# Patient Record
Sex: Male | Born: 1935 | Race: White | Hispanic: No | Smoking: Former smoker
Health system: Southern US, Community
[De-identification: ages and names within clinical notes are randomized; demographics above are authoritative.]

## PROBLEM LIST (undated history)

## (undated) DIAGNOSIS — R9431 Abnormal electrocardiogram [ECG] [EKG]: Secondary | ICD-10-CM

## (undated) DIAGNOSIS — I442 Atrioventricular block, complete: Secondary | ICD-10-CM

## (undated) DIAGNOSIS — G243 Spasmodic torticollis: Secondary | ICD-10-CM

## (undated) DIAGNOSIS — I714 Abdominal aortic aneurysm, without rupture, unspecified: Secondary | ICD-10-CM

## (undated) DIAGNOSIS — I2510 Atherosclerotic heart disease of native coronary artery without angina pectoris: Secondary | ICD-10-CM

## (undated) DIAGNOSIS — E7800 Pure hypercholesterolemia, unspecified: Secondary | ICD-10-CM

## (undated) DIAGNOSIS — I452 Bifascicular block: Secondary | ICD-10-CM

## (undated) DIAGNOSIS — I480 Paroxysmal atrial fibrillation: Secondary | ICD-10-CM

## (undated) DIAGNOSIS — R001 Bradycardia, unspecified: Secondary | ICD-10-CM

## (undated) DIAGNOSIS — I350 Nonrheumatic aortic (valve) stenosis: Secondary | ICD-10-CM

## (undated) DIAGNOSIS — M1990 Unspecified osteoarthritis, unspecified site: Secondary | ICD-10-CM

## (undated) DIAGNOSIS — I4510 Unspecified right bundle-branch block: Secondary | ICD-10-CM

## (undated) HISTORY — DX: Spasmodic torticollis: G24.3

## (undated) HISTORY — DX: Pure hypercholesterolemia, unspecified: E78.00

## (undated) HISTORY — DX: Bradycardia, unspecified: R00.1

## (undated) HISTORY — DX: Abdominal aortic aneurysm, without rupture, unspecified: I71.40

## (undated) HISTORY — PX: OTHER SURGICAL HISTORY: SHX169

## (undated) HISTORY — DX: Nonrheumatic aortic (valve) stenosis: I35.0

## (undated) HISTORY — DX: Unspecified right bundle-branch block: I45.10

## (undated) HISTORY — DX: Atherosclerotic heart disease of native coronary artery without angina pectoris: I25.10

## (undated) HISTORY — DX: Abnormal electrocardiogram (ECG) (EKG): R94.31

## (undated) HISTORY — DX: Bifascicular block: I45.2

## (undated) HISTORY — DX: Abdominal aortic aneurysm, without rupture: I71.4

---

## 1998-10-25 ENCOUNTER — Ambulatory Visit (HOSPITAL_BASED_OUTPATIENT_CLINIC_OR_DEPARTMENT_OTHER): Admission: RE | Admit: 1998-10-25 | Discharge: 1998-10-25 | Payer: Self-pay | Admitting: Otolaryngology

## 2002-03-30 ENCOUNTER — Inpatient Hospital Stay (HOSPITAL_COMMUNITY): Admission: EM | Admit: 2002-03-30 | Discharge: 2002-04-01 | Payer: Self-pay | Admitting: Emergency Medicine

## 2002-10-13 ENCOUNTER — Ambulatory Visit (HOSPITAL_COMMUNITY): Admission: RE | Admit: 2002-10-13 | Discharge: 2002-10-14 | Payer: Self-pay | Admitting: Cardiology

## 2002-10-26 ENCOUNTER — Encounter: Payer: Self-pay | Admitting: Surgery

## 2002-10-30 ENCOUNTER — Inpatient Hospital Stay (HOSPITAL_COMMUNITY): Admission: RE | Admit: 2002-10-30 | Discharge: 2002-11-06 | Payer: Self-pay | Admitting: Surgery

## 2002-10-30 ENCOUNTER — Encounter: Payer: Self-pay | Admitting: Surgery

## 2002-10-30 ENCOUNTER — Encounter (INDEPENDENT_AMBULATORY_CARE_PROVIDER_SITE_OTHER): Payer: Self-pay | Admitting: Specialist

## 2002-10-30 HISTORY — PX: CORONARY ARTERY BYPASS GRAFT: SHX141

## 2002-10-30 HISTORY — PX: AORTIC VALVE REPLACEMENT: SHX41

## 2002-10-31 ENCOUNTER — Encounter: Payer: Self-pay | Admitting: Surgery

## 2002-11-01 ENCOUNTER — Encounter: Payer: Self-pay | Admitting: Surgery

## 2002-11-02 ENCOUNTER — Encounter: Payer: Self-pay | Admitting: Surgery

## 2003-01-01 ENCOUNTER — Encounter (HOSPITAL_COMMUNITY): Admission: RE | Admit: 2003-01-01 | Discharge: 2003-03-25 | Payer: Self-pay | Admitting: Cardiology

## 2003-02-15 ENCOUNTER — Encounter: Admission: RE | Admit: 2003-02-15 | Discharge: 2003-02-15 | Payer: Self-pay | Admitting: Emergency Medicine

## 2003-02-15 ENCOUNTER — Encounter: Payer: Self-pay | Admitting: Emergency Medicine

## 2003-03-27 ENCOUNTER — Encounter (HOSPITAL_COMMUNITY): Admission: RE | Admit: 2003-03-27 | Discharge: 2003-06-25 | Payer: Self-pay | Admitting: Cardiology

## 2003-07-09 ENCOUNTER — Encounter (HOSPITAL_COMMUNITY): Admission: RE | Admit: 2003-07-09 | Discharge: 2003-08-08 | Payer: Self-pay | Admitting: Cardiology

## 2004-03-29 ENCOUNTER — Emergency Department (HOSPITAL_COMMUNITY): Admission: EM | Admit: 2004-03-29 | Discharge: 2004-03-29 | Payer: Self-pay | Admitting: Internal Medicine

## 2005-05-19 ENCOUNTER — Ambulatory Visit: Payer: Self-pay | Admitting: Internal Medicine

## 2008-07-02 ENCOUNTER — Encounter: Admission: RE | Admit: 2008-07-02 | Discharge: 2008-07-02 | Payer: Self-pay | Admitting: Internal Medicine

## 2008-08-24 ENCOUNTER — Ambulatory Visit (HOSPITAL_COMMUNITY): Admission: RE | Admit: 2008-08-24 | Discharge: 2008-08-24 | Payer: Self-pay | Admitting: *Deleted

## 2010-04-11 ENCOUNTER — Ambulatory Visit: Payer: Self-pay | Admitting: Cardiology

## 2010-04-11 ENCOUNTER — Ambulatory Visit (HOSPITAL_COMMUNITY): Admission: RE | Admit: 2010-04-11 | Discharge: 2010-04-11 | Payer: Self-pay | Admitting: Cardiology

## 2010-07-14 ENCOUNTER — Ambulatory Visit: Payer: Self-pay | Admitting: Cardiology

## 2010-09-29 ENCOUNTER — Encounter: Payer: Self-pay | Admitting: Internal Medicine

## 2010-11-13 ENCOUNTER — Ambulatory Visit (INDEPENDENT_AMBULATORY_CARE_PROVIDER_SITE_OTHER): Payer: Medicare Other | Admitting: Cardiology

## 2010-11-13 DIAGNOSIS — I359 Nonrheumatic aortic valve disorder, unspecified: Secondary | ICD-10-CM

## 2010-11-13 DIAGNOSIS — R51 Headache: Secondary | ICD-10-CM

## 2010-11-13 DIAGNOSIS — Z951 Presence of aortocoronary bypass graft: Secondary | ICD-10-CM

## 2010-12-15 ENCOUNTER — Other Ambulatory Visit: Payer: Self-pay | Admitting: *Deleted

## 2010-12-15 DIAGNOSIS — I2510 Atherosclerotic heart disease of native coronary artery without angina pectoris: Secondary | ICD-10-CM

## 2010-12-15 MED ORDER — FUROSEMIDE 80 MG PO TABS: 80.0000 mg | ORAL_TABLET | Freq: Every day | ORAL | Status: DC

## 2011-01-20 NOTE — Op Note (Signed)
[name], KNEBEL NO.:  [id number]   MEDICAL RECORD NO.:  [id number]          PATIENT TYPE:  AMB   LOCATION:  ENDO                         FACILITY:  [facility]   PHYSICIAN:  Georgiana Spinner, M.D.    DATE OF BIRTH:  07-26-1930   DATE OF PROCEDURE:  08/24/2008  DATE OF DISCHARGE:                               OPERATIVE REPORT   PROCEDURE:  Upper endoscopy.   INDICATIONS:  Hemoccult positivity, iron-deficiency anemia.   ANESTHESIA:  Fentanyl 50 mcg, Versed 4 mg.   PROCEDURE:  With the patient mildly sedated in the left lateral  decubitus position, the Pentax videoscopic endoscope was inserted in the  mouth, passed under direct vision through the esophagus which appeared  normal and in the stomach.  Fundus, body, antrum, duodenal bulb, second  portion of the duodenum were visualized, and they all appeared normal as  well.  From this point, the endoscope was slowly withdrawn, taking  circumferential views of duodenal mucosa until the endoscope been pulled  back and the stomach placed in retroflexion to view the stomach from  below.  The endoscope was straightened and withdrawn, taking  circumferential views remaining gastric and esophageal mucosa.  The  patient's vital signs and pulse oximeter remained stable.  The patient  tolerated the procedure well without apparent complications.   FINDINGS:  Negative examination.   PLAN:  Will have the patient follow-up with me as an outpatient and  proceed to colonoscopy.           ______________________________  Georgiana Spinner, M.D.     GMO/MEDQ  D:  08/24/2008  T:  08/25/2008  Job:  [id number]

## 2011-01-20 NOTE — Op Note (Signed)
[name] [name], [name] [name] NO.:  [id number]   MEDICAL RECORD NO.:  [id number]          PATIENT TYPE:  AMB   LOCATION:  ENDO                         FACILITY:  [facility]   PHYSICIAN:  Georgiana Spinner, M.D.    DATE OF BIRTH:  1929/12/22   DATE OF PROCEDURE:  08/24/2008  DATE OF DISCHARGE:                               OPERATIVE REPORT   PROCEDURE:  Colonoscopy.   INDICATIONS FOR PROCEDURE:  Hemoccult positivity, iron-deficiency  anemia.   ANESTHESIA:  Fentanyl 25 mcg, Versed 1 mg.   PROCEDURE IN DETAIL:  With the patient mildly sedated in the left  lateral decubitus position the Pentax videoscopic pediatric colonoscope  was inserted in the rectum after a rectal examination was performed  which was unremarkable.  The scope was passed under direct vision to the  cecum, identified by ileocecal valve and appendiceal orifice, the latter  of which was photographed.  From this point the colonoscope was slowly  withdrawn taking circumferential views of colonic mucosa stopping in the  rectum which appeared normal on direct and showed hemorrhoids on  retroflexed view.  The endoscope was straightened and withdrawn.  The  patient's vital signs, pulse oximeter remained stable.  The patient  tolerated procedure well without apparent complications.   FINDINGS:  Internal hemorrhoids, otherwise unremarkable examination.   PLAN:  Will have patient follow up with me as an outpatient to evaluate  any further workup that might be deemed necessary.           ______________________________  Georgiana Spinner, M.D.     GMO/MEDQ  D:  08/24/2008  T:  08/25/2008  Job:  [id number]

## 2011-01-23 NOTE — Op Note (Signed)
[name] [name], [name] [name] NO.:  [id number]   MEDICAL RECORD NO.:  [id number]                   PATIENT TYPE:  INP   LOCATION:  2304                                 FACILITY:  [facility]   PHYSICIAN:  Evelene Croon, M.D.                  DATE OF BIRTH:  26-Apr-1930   DATE OF PROCEDURE:  10/30/2002  DATE OF DISCHARGE:                                 OPERATIVE REPORT   PREOPERATIVE DIAGNOSES:  1. Severe three-vessel coronary disease.  2. Severe aortic stenosis.   POSTOPERATIVE DIAGNOSES:  1. Severe three-vessel coronary disease.  2. Severe aortic stenosis.   OPERATIVE PROCEDURES:  1. Median sternotomy.  2. Extracorporeal circulation.  3. Coronary artery bypass grafting x6 using the left internal mammary artery     graft to left anterior descending coronary with a saphenous vein graft to     posterior descending branch of the coronary artery, a sequential     saphenous vein graft to the first diagonal sub-branch and the obtuse     marginal coronary artery, and a sequential saphenous vein graft to the     second diagonal branch and the first diagonal sub-branch.  4. Aortic valve replacement using a 23-mm Carpentier-Edwards pericardial     valve.  5. Endoscopic vein harvesting from the right leg.   ATTENDING SURGEON:  Evelene Croon, M.D.   ASSISTANT:  Loura Pardon, P.A.-C.   ANESTHESIA:  General endotracheal.   CLINICAL HISTORY:  This patient is a 77 year old gentleman who presented  with a several-month history of exertional chest tightness and shortness of  breath.  He underwent an echocardiogram which showed a mean aortic valve  gradient of 22 with calcified aortic valve and a calculated aortic valve  area of 0.85 cm squared.  There was mild aortic insufficiency and mild  mitral regurgitation.  There was moderate left ventricular hypertrophy.  A  stress Cardiolite exam showed exercise-induced chest discomfort with ST and  T wave abnormalities as well as  a reversible ischemia in the inferolateral  and apical regions.  He subsequently underwent cardiac catheterization which  showed severe three-vessel coronary disease.  The LAD had a 99% mid vessel  stenosis.  The first diagonal or intermediate artery was a large branching  vessel.  There was 40-50% stenosis in the trunk of one of the sub-branches.  Then there was an 80-90% stenosis and a moderate sized sub-branch of this  trunk.  The second diagonal branch had 95% ostial stenosis and an 80-90%  proximal stenosis.  The left circumflex coronary artery had 60% mid vessel  stenosis.  The right coronary artery was diffusely diseased.  There was  about 50-60% stenosis in the mid portion of the posterior descending branch.  Left ventricular function was well preserved.  The left ventriculogram  showed a few beats where there was severe mitral regurgitation, but I think  this was most  likely due to the catheter becoming tangled in the mitral  valve apparatus.  After review of the angiogram and examination of the  patient, it was felt that coronary artery bypass grafting surgery and aortic  valve replacement was the best treatment.  I discussed the options for valve  prosthesis including mechanical and tissue valves.  We discussed the pros  and cons of the two types of valves and the patient felt that he would not  want to take Coumadin long term if he could avoid it.  Given his age of 77  years and severe three-vessel coronary disease, I felt that a tissue valve  would be an appropriate choice for him.  He understood that there was a  small chance that this could deteriorate over his lifetime requiring further  intervention.  I discussed the operative procedure of coronary artery bypass  surgery and aortic valve replacement with him including alternatives,  benefits, and risks including bleeding, blood transfusion, infection,  stroke, myocardial infarction, and death.  He understood and agreed to   proceed.   OPERATIVE PROCEDURE:  The patient was taken to the operating room and placed  on the table in the supine position.  After induction of general  endotracheal anesthesia, a Foley catheter was placed in the bladder using a  sterile technique.  Then the chest, abdomen, and both lower extremities were  prepped and draped in the usual sterile manner.  His chest was entered  through a median sternotomy incision.  The pericardium opened in the  midline.  Examination of the heart showed good ventricular contractility.  The ascending aorta had no palpable plaques in it.   A transesophageal echocardiogram was performed.  This showed severe calcific  aortic stenosis.  There was moderate left ventricular hypertrophy with well  preserved ventricular function.  There was 1-2+ mitral regurgitation but the  mitral valve appeared structurally normal.  I felt that this was most likely  due to the severe aortic stenosis and ischemia.   Then the left internal mammary artery was harvested from the chest wall as a  pedicle graft.  This was a medium caliber vessel with excellent blood flow  through it.  At the same time, a segment of greater saphenous vein was  harvested from the right leg using endoscopic vein harvest technique.  This  vein was medium sized and good quality.   The patient was heparinized and when an adequate activated clotting time was  achieved, the distal ascending aorta was cannulated using a 20 French aortic  cannula for arterial inflow.  Venous outflow was achieved using a two-stage  venous cannula through the right atrial appendage.  An antegrade  cardioplegia and vent cannula was inserted in the aortic root.  A left  ventricular vent was placed through the right superior pulmonary vein and a  respiratory cardioplegia cannula was inserted through the right atrium into  the coronary sinus.  Then the patient was placed on cardiopulmonary bypass and the distal  coronary was  identified.  The aorta was then cross-clamped and 500 mL of  cold blood antegrade cardioplegia was administered in the aortic root with  quick arrest of the heart.  This was followed by 300 mL of cold blood  retrograde cardioplegia.  Additional doses of retrograde cardioplegia were  given throughout the case at approximately 20-30 minute intervals to  maintain myocardial temperature around 10 degrees Centigrade.  Systemic  hyperthermia to 20 degrees Centigrade and topical hypothermia with iced  saline was used.  A temperature probe was placed in the septum and  insulating pad in the pericardium.   The first distal anastomosis was performed to the posterior descending  coronary artery.  The internal diameter distally was about 1.6 mm.  The  conduit used was segment of greater saphenous vein.  The anastomosis  performed in an end-to-side manner using continuous 7-0 Prolene suture.  Flow was measured through the graft and was excellent.   The second distal anastomosis was performed to the sub-branch of the first  diagonal artery.  The internal diameter was 1.6 mm.  The conduit used was a  second segment of greater saphenous vein.  The anastomosis performed in a  sequential side-to-side manner using continuous 7-0 Prolene suture.  Flow  was measured through the graft and was excellent.   A third distal anastomosis was performed to the obtuse marginal coronary  artery.  The internal diameter was 1.6 mm.  The conduit used was the same  segment of saphenous vein and the anastomosis performed in a sequential end-  to-side manner using continuous 7-0 Prolene suture.  Flow was measured  through the graft and was excellent.  Then another dose of retrograde  cardioplegia was given.   The fourth distal anastomosis was performed to the second diagonal branch of  the LAD.  The internal diameter was 1.6 mm.  The conduit used was a third  segment of greater saphenous vein and the anastomosis performed  in a  sequential side-to-side manner using continuous 7-0 Prolene suture.  Flow  was measured through the graft and was excellent.   The fifth distal anastomosis was performed to the sub-branch of the first  diagonal artery.  The internal diameter of this vessel was about 1.6 mm.  The conduit used was the same segment of greater saphenous vein and the  anastomosis performed in a sequential end-to-side manner using continuous 7-  0 Prolene suture.  Flow was measured through the graft and was excellent.  Then another dose of cardioplegia was given down the vein grafts and in a  retrograde manner.   The sixth distal anastomosis was then performed to the mid portion of the  left anterior descending coronary artery.  The internal diameter of this  vessel was 1.75 mm.  The conduit used was left internal mammary graft and  this was brought through an opening in the left pericardium and through the phrenic nerve.  It was anastomosed to the LAD in an end-to-side manner using  continuous 8-0 Prolene suture.  The pedicle was tacked to the epicardium  with 6-0 Prolene sutures.   Attention was then turned to the aortic valve replacement.  The aorta was  opened transversely about 1 cm above the right coronary ostium.  Examination  of the native valve showed that it was a three leaflet valve with severe  calcification of the leaflets and the annulus.  The leaflets were excised  and the annulus decalcified using rongeurs.  Care was taken to remove all  particular debris.  The aortic root and left ventricle were irrigated with  iced saline solution.  The annulus was sized and a 23-mm Carpentier-Edwards  pericardial valve was chosen.  Then a series of pledgetted 2-0 Ethibond  horizontal mattress sutures were placed around the annulus with the pledgets  in the subannular position.  The sutures were placed through the sewing ring  and the valve lowered into place.  The sutures were tied sequentially.   The  valve seated nicely.  Then the patient was rewarmed to 37 degrees  Centigrade.  The aorta was closed in two layers using continuous 3-0 Prolene  suture.  With the cross-clamp in place, the three proximal vein grafts  anastomoses were performed at the aortic root, end-to-side manner, using  continuous 6-0 Prolene suture.  Then de-airing maneuvers were performed.  The head was placed in the Trendelenburg position and the clamp removed from  the mammary pedicle.  There was rapid warming of the ventricular septum and  return of spontaneous ventricular fibrillation.  The cross-clamp was  removed.  Time was 183 minutes and the patient defibrillated into sinus  rhythm.   The proximal and distal anastomoses appeared hemostatic and the lie of the  graft satisfactory.  Graft markers placed around the proximal anastomoses.  Two temporary right ventricular and right atrial pacing wires placed well  into the skin.   When the patient rewarmed to 37 degrees centigrade, he was weaned from  cardiopulmonary bypass on low dose dopamine.  Total bypass time was 227  minutes.  Transesophageal echocardiogram was performed and showed a normal  functioning aortic valve prosthesis.  There was 1+ mitral insufficiency.  Left ventricular function appeared well preserved.  Cardiac output was 5 ___  per minute.  Protamine was then given and the venous and aortic cannulas  were removed without difficulty.  Hemostasis was achieved.  Three chest  tubes were placed with two in the post pericardium, one in the left pleural  space, and one in the anterior mediastinum.  The pericardium was  reapproximated over the heart.  The sternum was closed with #6 stainless  steel wires.  The fascia was closed with continuous 1 Vicryl suture.  The  subcutaneous tissue was closed with continuous 2-0 Vicryl and the skin with  3-0 Vicryl subcuticular closure.  The lower extremity vein harvest site was closed in layers of similar  manner.  The  sponge, instrument, and needle counts correct according to the scrub nurse.  Dry sterile dressings were applied over the incisions, around the chest  tubes which were hooked to Pleur-evac  suction.  The patient remained  hemodynamically stable and transported to the SICU in guarded but stable  condition.                                                    Evelene Croon, M.D.    BB/MEDQ  D:  10/30/2002  T:  10/30/2002  Job:  [id number]   cc:   Cassell Clement, M.D.  [address], [address]  [place]  [place] [address]  Fax: [phone]   Cardiac cath lab

## 2011-01-23 NOTE — H&P (Signed)
[name] [name], [name] [name] NO.:  [id number]   MEDICAL RECORD NO.:  [id number]                   PATIENT TYPE:  AMB   LOCATION:  DSC                                  FACILITY:  [facility]   PHYSICIAN:  Colleen Can. Deborah Chalk, M.D.            DATE OF BIRTH:  05/06/1930   DATE OF ADMISSION:  10/13/2002  DATE OF DISCHARGE:                                HISTORY & PHYSICAL   CHIEF COMPLAINT:  Exertional chest tightness with associated shortness of  breath.   HISTORY OF PRESENT ILLNESS:  The patient is a very pleasant 77 year old  widowed male who was recently seen and evaluated by Dr. Cassell Clement  towards the mid to latter part of January.  He has been experiencing some  exertional chest tightness as well as shortness of breath.  It seems to  occur if he tries to exercise after eating.  He has also had some  palpitations and skipping of his heart beat at night.  He was subsequently  referred for a 2-D echocardiogram which showed a peak aortic valve velocity  of 3.2 mm/sec, which corresponds to a mean gradient of 22 mmHg and a  calculated aortic valve area of 0.85, which would place him in the moderate-  to-severe aortic stenosis category.  There was also evidence of mild aortic  insufficiency, mild mitral regurgitation, trace tricuspid regurgitation with  moderate LVH present.   A stress echocardiogram was also performed on January 29th which  demonstrated exercise-induced chest discomfort with associated ST and T wave  abnormalities, as well as reversible inferolateral and apical areas of  ischemia on the Cardiolite scan.  He exercised on the Bruce protocol for a  total of 5 minutes and 45 seconds.   In light of these findings, he is referred on for a left and right heart  catheterization in order to evaluate both the aortic valve as well as  coronaries.   PAST MEDICAL HISTORY:  1. History of shingles in 2003 which was associated with an episode of  syncope.  2. History of a heart murmur.  3. Hypertension.  4. History of allergies.  5. Nasal polyps.   ALLERGIES:  SULFA.   CURRENT MEDICINES:  1. Diovan HCT 160/25 mg daily.  2. Aspirin daily.  3. Allegra-D p.r.n.  4. Tylenol p.r.n.   FAMILY HISTORY:  The patient's father died of complications from childhood  tuberculosis at the age of 35.  Mother died of a stroke at age 68.  He has  had four brothers, all of whom have died of heart trouble, two of which died  of sudden cardiac death.   SOCIAL HISTORY:  He is retired.  He quit smoking 20 years ago.  He does not  use alcohol.  He drinks very little caffeine.   REVIEW OF SYSTEMS:  Review of systems is basically as noted above.  He has  had a tendency  towards constipation and uses Senokot and mineral oil on a  p.r.n. basis.  He has had no hematochezia, no melena.  He denies dysuria.  He has had no recent fever, cough or flu-like symptoms.   PHYSICAL EXAMINATION:  GENERAL:  He is a very pleasant elderly white male in  no acute distress.  Weight is 182-1/2.  VITAL SIGNS:  Blood pressure 130/60 sitting, 110/70 standing.  Heart rate 60  and regular.  Respirations 18.  He is afebrile.  SKIN:  Skin is warm and dry.  Color is unremarkable.  LUNGS:  Lungs are clear to auscultation.  CARDIAC:  Exam shows a grade 2/6 murmur or aortic stenosis.  S2 was not able  to be appreciated.  ABDOMEN:  Abdomen is soft.  Positive bowel sounds.  EXTREMITIES:  Extremities show diminished pulses.  There were bilateral  femoral bruits noted.   LABORATORY AND ACCESSORY CLINICAL DATA:  Previous chest x-ray showed the  lungs to be clear.   Electrocardiogram showed normal sinus rhythm with nonspecific T wave  flattening.   Other labs are pending.   OVERALL IMPRESSION:  1. Aortic stenosis.  2. Abnormal adenosine Cardiolite.  3. History of hypertension.  4. Positive family history for coronary disease.   PLAN:  We will proceed on with left and  right heart catheterization.  The  procedure has been reviewed in full detail and he is willing to proceed on  Friday, October 13, 2002.      Juanell Fairly C. Earl Gala, N.P.                 Colleen Can. Deborah Chalk, M.D.    LCO/MEDQ  D:  10/10/2002  T:  10/10/2002  Job:  [id number]   cc:   Cassell Clement, M.D.  [address]  [place]  [place] [address]  Fax: [phone]   Reuben Likes, M.D.  [address]  [place]  [place] [address]  Fax: [phone]

## 2011-01-23 NOTE — Discharge Summary (Signed)
NAMEREEDY, BIERNAT NO.:  [id number]   MEDICAL RECORD NO.:  [id number]                   PATIENT TYPE:  INP   LOCATION:  2006                                 FACILITY:  [facility]   PHYSICIAN:  Evelene Croon, M.D.                  DATE OF BIRTH:  October 30, 1929   DATE OF ADMISSION:  10/30/2002  DATE OF DISCHARGE:  11/06/2002                                 DISCHARGE SUMMARY   DISCHARGE DIAGNOSES:  1. Aortic stenosis.  2. Severe three-vessel atherosclerotic coronary artery disease.  3. Postoperative coagulopathy requiring exploration for bleeding,     postoperative day #1.  4. Acute blood loss anemia postoperatively requiring transfusion.   SECONDARY DIAGNOSES:  1. Hypertension.  2. Inguinal hernia, not repaired.  3. Strong family history of atherosclerotic coronary artery disease.  4. Remote tobacco habituation.  5. Glaucoma.   PROCEDURES:  1. October 30, 2002, coronary artery bypass graft surgery x6 is the first     half of the procedure.  In this procedure, the left internal mammary     artery was connected in an end-to-side fashion to the left anterior     descending coronary artery, a reversed saphenous vein graft was fashioned     from the aorta to the posterior descending coronary artery, a sequential     reversed saphenous vein graft was fashioned from the aorta to the second     diagonal and then to the subbranch of the first diagonal and a sequential     reversed saphenous vein graft was fashioned from the aorta to the     subbranch of the first diagonal and then to the first obtuse marginal.     In addition, at stage 2 of the surgery, he had placement of a 23-mm     bioprosthetic aortic valve with Dr. Evelene Croon, surgeon.  2. Procedure #2, October 31, 2002, exploration of mediastinum, evacuation     of clot, ligation of bleeding pericardial edge, Dr. Evelene Croon.   DISCHARGE DISPOSITION:  The patient is ready for discharge, postop day  #7.  He has been afebrile in the postoperative period and has had no respiratory  compromise.  In fact, he was maintaining good oxygen saturations on room air  from postoperative day #2.  He is ambulating independently, his mental  status has been clear, his incisions are healing nicely.  He did have some  volume overload in the postoperative period; for example, he was 16 pounds  fluid positive on postoperative day #4; this has not reduced to 6 pounds  over his preoperative weight by postoperative day #7.  He will continue on  Lasix diuresis at home.  His hemoglobin has maintained stable after multiple  transfusions for a postoperative coagulopathy.  He received intraoperatively  and in the immediate postoperative, 5 units of fresh frozen plasma, three 8-  packs of platelets, 11  units of red blood cells and 1 of cryoprecipitate;  then for a hemoglobin of 7.5, postoperative day #2, he received 2 units of  packed red blood cells.  His hemoglobin on postoperative day #3 was 9.6 and  remained stable.  The patient has experienced no cardiac dysrhythmias  postoperatively; he has maintained sinus rhythm throughout the postoperative  period.   DISCHARGE MEDICATIONS:  He goes home on the following medications:  1. Tylenol 325 mg one to two tabs every four to six hours as needed for     pain.  2. Toprol-XL 25 mg daily.  3. Lasix 80 mg daily for a week.  4. Potassium chloride 20 mEq daily for a week.  5. Ambien 5 mg at bedtime as needed for insomnia.  6. Alphagan ophthalmic solution 0.15% one drop to both eyes twice daily.  7. Xalatan ophthalmic solution 0.005% one drop to both eyes at bedtime.  8. Coumadin 5 mg tabs -- he is to take one and one-half tab daily until     advised by Dr. Marylynn Pearson office to change this dose.   ACTIVITY:  The patient is to walk daily to build up his strength.  He is  asked not to lift more than 10 pounds nor to engage in any twisting  exercises for the  next 6 weeks.   DIET:  His discharge diet is a low-sodium, low-cholesterol diet.   SPECIAL DISCHARGE INSTRUCTIONS:  He may shower daily and can call the office  of [facility] if there is drainage from  his chest incision.   FOLLOWUP:  He has an office visit with Dr. Cassell Clement in two weeks and  Dr. Yevonne Pax office will call to arrange that appointment.  He will see  Dr. Laneta Simmers, Tuesday, November 28, 2002, at 11:30 in the morning.  He is to  bring his chest x-ray from Dr. Patty Sermons at that time.  He is also to  present to the [facility] office, Wednesday, March 3rd, and  Friday, March 5th, for pro time/INR studies and as needed until the Coumadin  dose is stable.   BRIEF HISTORY:  The patient is a 77 year old gentleman.  He has a several-  month history of exertional chest tightness and shortness of breath.  It  occurs most commonly after he has eaten and then tries to perform any  exercise.  His pain is of moderate severity and goes away after a few  minutes of rest.  He underwent an echocardiogram recently which showed a  mean aortic valve gradient of 22 mmHg, calculated aortic valve area of 0.85  sq cm.  He did have mild mitral regurgitation.  There was moderate left  ventricular hypertrophy.  He also underwent stress Cardiolite which showed  ST and T wave abnormalities as well as reversible ischemia in inferolateral  and apical regions.  He underwent subsequent left heart catheterization  which showed that the LAD had a 99% mid-vessel stenosis.  The first diagonal  was a large branching vessel with a 40-50% stenosis in the trunk of the  subbranch and an 80-90% stenosis in a moderate-sized subbranch.  The second  diagonal had a 95% ostial stenosis and an 80-90% proximal stenosis.  The  left circumflex had a 60% mid-vessel stenosis.  The right coronary artery  was diffusely diseased.  There was a 50-60% stenosis at the midportion  of the PDA.  Left ventricular function was well-preserved.  He will present for  elective aortic  valve replacement and with bioprosthetic valve and coronary  artery bypass graft surgery on October 30, 2002.   HOSPITAL COURSE:  After presentation to [facility] on February  23rd, he underwent coronary artery bypass graft surgery x6 as already  dictated and aortic valve replacement.  In the immediate postoperative  period, he was transfused for coagulopathy with multiple units of fresh  frozen plasma, red blood cells and platelets.  He was taken back in the  early morning hours of postoperative day #1 for exploration of the  mediastinum and to also evacuate clot.  There was a bleeding pericardial  edge found during this surgery.  Patient tolerated procedure well.  He was  transfused with two units of packed red blood cells on postoperative day #2  for hemoglobin of 7.5 and has done well since that time with no subsequent  signs of blood loss.  As detailed in discharge disposition, the patient  did not require prolonged oxygen support in the postoperative period.  His  incisions were healing nicely.  He did have marked volume overload which has  now responded well to continued gentle diuresis.  He goes home with the  medications and followup as dictated above.  His condition is improved.     Maple Mirza, P.A.                    Evelene Croon, M.D.    GM/MEDQ  D:  11/06/2002  T:  11/07/2002  Job:  [id number]   cc:   Cassell Clement, M.D.  [address]  [place]  [place] [address]  Fax: [phone]   Reuben Likes, M.D.  [address]  [place]  [place] [address]  Fax: [phone]

## 2011-01-23 NOTE — Cardiovascular Report (Signed)
[name] [name], HOLEMAN NO.:  [id number]   MEDICAL RECORD NO.:  [id number]                   PATIENT TYPE:  OIB   LOCATION:  2041                                 FACILITY:  [facility]   PHYSICIAN:  Colleen Can. Deborah Chalk, M.D.            DATE OF BIRTH:  [birth year]   DATE OF PROCEDURE:  DATE OF DISCHARGE:  10/14/2002                              CARDIAC CATHETERIZATION   HISTORY:  The patient is a 77 year old gentleman who presents with a several  month history of exertional chest tightness, shortness of breath.  The 2-D  echocardiogram showed a mean aortic valve gradient of 22 mmHg with a  calcified aortic valve.  There is mild aortic insufficiency.  There was mild  mitral regurgitation on the echocardiogram.  He had reversible ischemia in  the inferolateral and apical regions, and was subsequently referred for  catheterization.   PROCEDURE:  Right and left heart catheterization with selective coronary  angiography and left ventricular angiography.   TYPE AND SITE OF ENTRY:  Percutaneous right femoral artery, percutaneous  right femoral vein.   CATHETERS:  A 6-French, forward curved Judkins right and left coronary  catheters, 6-French pigtail ventriculographic catheter, 7-French Swan-Ganz  thermodilution catheter.   CONTRAST MATERIAL:  Omnipaque.   MEDICATIONS:  1. Given prior to the procedure:  Valium 10 mg p.o.  2. Given during the procedure:  None.   COMMENTS:  The patient tolerated the procedure well.   HEMODYNAMIC DATA:  The right atrial pressure R-wave is 7, V wave of 3, mean  of 2, RV is 24/1-5.  PA was 27/7-16.  Pulmonary capillary wedge pressure was  A of 7, V of 4, mean of 3.  Aortic pressure 113/54.  LV was 136/0-9.   The cardiac output by Fick was 3.7 with a cardiac index of 1.8, cardiac  output by thermodilution was 4.4 with cardiac index of 2.2.  The aortic  valve area was 1.0 cm squared.   ANGIOGRAPHIC DATA:  1. Left main coronary  artery is normal.  2. Left anterior descending:  Left anterior descending extended to the apex.     There was severe 99% stenosis in the mid portion.  There was a high     somewhat bifurcating diagonal vessel with a 95% segmental stenosis.  3. Intermediate coronary has segmental disease with 95% stenosis.  It is a     moderate-size vessel and would be suitable for bypass.  4. Left circumflex.  The left circumflex is a moderate-size vessel.  There     is one obtuse marginal with severe stenosis, and two branches that     continue to the posterolateral wall.  5. Right coronary artery.  The right coronary artery is heavily calcified     proximally.  The posterior descending had approximately 50-60% stenosis     in the mid portion of the vessel.  This was a long vessel  that wrapped     onto the apex.   LEFT VENTRICULAR ANGIOGRAM:  A left ventricular angiogram is performed in  the RAO position.  Overall cardiac size and silhouette were normal.  There  was 2+ to 3+ mitral regurgitation present, but it may have been catheter  induced.  The global ejection fraction would be 60-70%.   OVERALL IMPRESSION:  1. Normal left ventricular function.  2. Severe aortic stenosis.  3. Mitral regurgitation.  4. Severe three-vessel coronary disease.   DISCUSSION:  In light of these findings, the patient will be referred for  coronary artery bypass grafting, aortic valve replacement.  It is felt that  he needs grafts to the left anterior descending branches of the diagonal and  obtuse marginal, and intermediate and posterior descending coronaries.                                               Colleen Can. Deborah Chalk, M.D.    SNT/MEDQ  D:  03/03/2003  T:  03/04/2003  Job:  [id number]  Cassell Clement, M.D.  [address], [address]  [place]  [place] [address]  Fax: [phone]   cc:   Cassell Clement, M.D.  [address], [address]  [place]  [place] [address]  Fax: [phone]

## 2011-01-23 NOTE — Op Note (Signed)
[name] [name], RAJEWSKI NO.:  [id number]   MEDICAL RECORD NO.:  [id number]                   PATIENT TYPE:  INP   LOCATION:  2304                                 FACILITY:  [facility]   PHYSICIAN:  Evelene Croon, M.D.                  DATE OF BIRTH:  09-Jan-1930   DATE OF PROCEDURE:  10/31/2002  DATE OF DISCHARGE:                                 OPERATIVE REPORT   PREOPERATIVE DIAGNOSIS:  Mediastinal bleeding secondary to coagulopathy,  status post aortic valve replacement and coronary artery bypass graft  surgery.   POSTOPERATIVE DIAGNOSIS:  Mediastinal bleeding secondary to coagulopathy,  status post aortic valve replacement and coronary artery bypass graft  surgery.   PROCEDURE:  Exploration of the mediastinum, evacuation of hematoma, ligation  of bleeding pericardial edge.   SURGEON:  Evelene Croon, M.D.   ANESTHESIA:  General endotracheal.   CLINICAL HISTORY:  This patient is a 77 year old gentleman who underwent  coronary artery bypass graft surgery x6 and aortic valve replacement  yesterday.  He had postoperative coagulopathy with an INR elevated at 3.3  and thrombocytopenia.  His coagulopathy was corrected, but he continued to  remain hemodynamically labile with a decrease in hematocrit.  His chest tube  output initially was about 300 cc/hr. for several hours but after his  coagulopathy was corrected and he began making clot, the chest tube drainage  rapidly decreased to 20 mL for an hour.  He remained hemodynamically  unstable, requiring Levophed for pressure support, although he had a good  cardiac index of 2.1.  I was concerned that he may have retained hematoma  and ongoing bleeding that was not coming out through the clotted chest  tubes.  A chest x-ray was obtained, and this showed a somewhat widened  mediastinal contour with left pleural effusion.  I felt the safest course of  action would be to return the patient to the operating room  for exploration.  I discussed the operative procedure with the patient's significant other by  telephone, including alternatives, benefits, and risks, and she understood  and agreed to proceed.   DESCRIPTION OF PROCEDURE:  The patient was taken to the operating room and  placed on the table in the supine position.  General endotracheal anesthesia  was induced.  The patient was still intubated.  The chest was prepped with  Betadine soap and solution and draped in the usual sterile manner.  The  mediastinal incision was reopened.  The sternal wires were removed and the  sternum retracted.  There was some blood clot present within the pericardium  and anterior mediastinum.  There was a large amount of blood and clot  present in the left pleural space.  All this was evacuated.  The cannulation  sites appeared hemostatic. The graft sites appeared hemostatic.  There was  obvious arterial bleeding from the left pericardial edge in one  location.  This was suture ligated without difficulty.  No other bleeding sites were  seen.  There appeared to be complete hemostasis at this time.  The chest  tubes were declotted and returned to their original position.  The  pericardium was reapproximated over the heart.  The sternum was closed with  #6 stainless steel wires.  The fascia was closed with continuous #1 Vicryl  suture.  The subcutaneous tissue was closed with continuous 2-0 Vicryl and  the skin with 3-0 Vicryl subcuticular closure.  The sponge, needle, and  instrument counts were correct according to the scrub nurse.  A dry sterile  dressing was applied over the  incision and around the chest tubes, which were hooked to Pleuravac suction.  The patient remained hemodynamically stable at the conclusion of surgery and  was transported to the surgical intensive care unit in guarded but stable  condition.                                               Evelene Croon, M.D.    BB/MEDQ  D:  10/31/2002   T:  10/31/2002  Job:  [id number]

## 2011-01-23 NOTE — Op Note (Signed)
[name] [name], BEGUE NO.:  [id number]   MEDICAL RECORD NO.:  [id number]                   PATIENT TYPE:  INP   LOCATION:  2006                                 FACILITY:  [facility]   PHYSICIAN:  Sheldon Silvan, M.D.                   DATE OF BIRTH:  1929-10-24   DATE OF PROCEDURE:  10/30/2002  DATE OF DISCHARGE:                                 OPERATIVE REPORT   PROCEDURE PERFORMED:  Intraoperative transesophageal echocardiography (TEE).   DESCRIPTION OF PROCEDURE:  The patient was brought to the operating room by  Dr. Laneta Simmers for aortic valve replacement and coronary artery bypass grafting.  It was decided that intraoperative TEE would be appropriate for him for both  monitoring and diagnostic purposes.   After adequate induction of general anesthesia and endotracheal intubation,  an [employer] TEE probe was passed through the oropharynx.  It had previously been covered with TEE cover.  The passage was uneventful  and atraumatic.   The left ventricle was imaged and seemed to be quite thickened.  The  contractility was noted to be adequate in all four segments.  The mitral  valve was examined and the leaflets were intact as were the chordae.  There  was quite a bit of redundancy in the leaflets.  There was 2 to 3+ mitral  regurgitation noted on Color Flow exam.   The aortic valve was examined and found to be quite sclerotic.  It was  tricuspid.  There was 4+ aortic stenosis on Color Flow exam and 2+ aortic  regurgitation.  The interatrial septum was examined and found to be free of  any patent foramen ovale on Color Flow exam.  The left atrial appendage was  seen and found to be free of clot or thrombus.   The patient was placed on the cardiopulmonary bypass machine and Dr. Laneta Simmers  performed replacement of the aortic valve with a tissue valve as well as  bypass grafting.   On weaning from the bypass machine, the heart was filled and  after maneuvers  were carried out to remove air, bypass was discontinued when it was  appropriate.   The left ventricle was unchanged in its appearance, prior to bypass.  The  aortic valve replacement was noted and three fine leaflets could be seen.  There was no stenotic area noted on Color Flow and no regurgitation noted.  The mitral valve was unchanged from its previous appearance.  The patient  was weaned adequately and taken to the SICU after removal of the TEE probe.                                               Sheldon Silvan, M.D.    DC/MEDQ  D:  11/02/2002  T:  11/02/2002  Job:  [id number]
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
## 2011-01-28 ENCOUNTER — Other Ambulatory Visit: Payer: Self-pay | Admitting: *Deleted

## 2011-02-06 ENCOUNTER — Other Ambulatory Visit: Payer: Self-pay

## 2011-05-06 ENCOUNTER — Encounter: Payer: Self-pay | Admitting: Cardiology

## 2011-05-06 ENCOUNTER — Ambulatory Visit (INDEPENDENT_AMBULATORY_CARE_PROVIDER_SITE_OTHER): Payer: Medicare Other | Admitting: Cardiology

## 2011-05-06 VITALS — BP 140/75 | HR 47 | Wt 159.0 lb

## 2011-05-06 DIAGNOSIS — I119 Hypertensive heart disease without heart failure: Secondary | ICD-10-CM | POA: Insufficient documentation

## 2011-05-06 DIAGNOSIS — R001 Bradycardia, unspecified: Secondary | ICD-10-CM | POA: Insufficient documentation

## 2011-05-06 DIAGNOSIS — I452 Bifascicular block: Secondary | ICD-10-CM

## 2011-05-06 DIAGNOSIS — E7800 Pure hypercholesterolemia, unspecified: Secondary | ICD-10-CM

## 2011-05-06 DIAGNOSIS — I495 Sick sinus syndrome: Secondary | ICD-10-CM

## 2011-05-06 DIAGNOSIS — I259 Chronic ischemic heart disease, unspecified: Secondary | ICD-10-CM

## 2011-05-06 DIAGNOSIS — I498 Other specified cardiac arrhythmias: Secondary | ICD-10-CM

## 2011-05-06 DIAGNOSIS — Z9889 Other specified postprocedural states: Secondary | ICD-10-CM

## 2011-05-06 DIAGNOSIS — Z951 Presence of aortocoronary bypass graft: Secondary | ICD-10-CM

## 2011-05-06 MED ORDER — ASPIRIN 81 MG PO TABS: ORAL_TABLET | ORAL | Status: DC

## 2011-05-06 NOTE — Progress Notes (Signed)
[name] [name] Date of Birth:  20-Apr-1930 [facility] / [facility] [address]   [address] [place], [place]  [address] [phone]           Fax   [phone]  HPI: This pleasant 77 year old gentleman is seen for a scheduled 4 month followup office visit.  He has a history of known ischemic heart disease and valvular heart disease.  On 10/30/02 he underwent coronary artery bypass graft surgery as well as insertion of a bioprosthetic aortic valve for severe aortic stenosis.  Since then he has done well.  The patient also has a history of chronic marked intrinsic sinus bradycardia with bifascicular block.  He has not had any symptoms to suggest that he needs a pacemaker.  He remains asymptomatic in terms of his bradycardia.  Current Outpatient Prescriptions  Medication Sig Dispense Refill  . amLODipine (NORVASC) 2.5 MG tablet Take 2.5 mg by mouth daily.        Marland Kitchen aspirin 81 MG tablet Take 81 mg daily      . bimatoprost (LUMIGAN) 0.03 % ophthalmic solution 1 drop at bedtime.        . furosemide (LASIX) 80 MG tablet Take 1 tablet (80 mg total) by mouth daily.  30 tablet  11  . GuaiFENesin (MUCINEX PO) Take by mouth as needed.        . Loratadine (CLARITIN PO) Take by mouth as needed.        . potassium chloride (KLOR-CON) 20 MEQ packet Take 20 mEq by mouth daily.          No Known Allergies  There is no problem list on file for this patient.   History  Smoking status  . Former Smoker  Smokeless tobacco  . Not on file    History  Alcohol Use: Not on file    Family History  Problem Relation Age of Onset  . Hypertension Mother   . Hypertension Sister     Review of Systems: The patient denies any heat or cold intolerance.  No weight gain or weight loss.  The patient denies headaches or blurry vision.  There is no cough or sputum production.  The patient denies dizziness.  There is no hematuria or hematochezia.  The patient denies any muscle aches or arthritis.   The patient denies any rash.  The patient denies frequent falling or instability.  There is no history of depression or anxiety.  All other systems were reviewed and are negative.   Physical Exam: Filed Vitals:   05/06/11 1552  BP: 140/75  Pulse: 47   The general appearance reveals a well-developed elderly gentleman in no acute distress.  He has some early impairment of memory.Pupils equal and reactive.   Extraocular Movements are full.  There is no scleral icterus.  The mouth and pharynx are normal.  The neck is supple.  The carotids reveal no bruits.  The jugular venous pressure is normal.  The thyroid is not enlarged.  There is no lymphadenopathy.  The chest is clear to percussion and auscultation. There are no rales or rhonchi. Expansion of the chest is symmetrical.    The heart reveals a soft systolic ejection murmur across the prosthetic aortic valve.  No diastolic murmur.  No gallop or rub.The abdomen is soft and nontender. Bowel sounds are normal. The liver and spleen are not enlarged. There Are no abdominal masses. There are no bruits.  The pedal pulses are good.  There is no phlebitis or  edema.  There is no cyanosis or clubbing.  Strength is normal and symmetrical in all extremities.  There is no lateralizing weakness.  There are no sensory deficits.    Integument reveals easy bruising and we are reducing his aspirin to just 81 mg daily.  He is not certain what his present doses   Assessment / Plan: The patient will continue other medications same and be rechecked In 4 months for followup office visit

## 2011-05-06 NOTE — Assessment & Plan Note (Signed)
The patient has not been expressing any symptoms of dizziness or syncope.  His electrocardiogram today shows marked sinus bradycardia at 47 per minute with first degree heart block and with bifascicular block.  No history of any Stokes-Adams attacks.

## 2011-05-06 NOTE — Assessment & Plan Note (Signed)
The patient has a history of ischemic heart disease and is status post CABG in 2004.  He has had no recurrent angina pectoris.

## 2011-05-06 NOTE — Assessment & Plan Note (Signed)
The patient has a past history of essential hypertension and is on low dose amlodipineAs well as furosemide.  He has not been having any headaches or dizzy spells.  No shortness of breath or symptoms of congestive heart failure.

## 2011-05-07 ENCOUNTER — Encounter: Payer: Self-pay | Admitting: Cardiology

## 2011-07-09 DIAGNOSIS — R9431 Abnormal electrocardiogram [ECG] [EKG]: Secondary | ICD-10-CM

## 2011-07-09 HISTORY — DX: Abnormal electrocardiogram (ECG) (EKG): R94.31

## 2011-08-05 ENCOUNTER — Ambulatory Visit (INDEPENDENT_AMBULATORY_CARE_PROVIDER_SITE_OTHER): Payer: Medicare Other | Admitting: Nurse Practitioner

## 2011-08-05 ENCOUNTER — Encounter: Payer: Self-pay | Admitting: Nurse Practitioner

## 2011-08-05 VITALS — BP 170/80 | HR 57 | Ht 69.0 in | Wt 158.1 lb

## 2011-08-05 DIAGNOSIS — I498 Other specified cardiac arrhythmias: Secondary | ICD-10-CM

## 2011-08-05 DIAGNOSIS — R001 Bradycardia, unspecified: Secondary | ICD-10-CM

## 2011-08-05 DIAGNOSIS — R42 Dizziness and giddiness: Secondary | ICD-10-CM

## 2011-08-05 DIAGNOSIS — I119 Hypertensive heart disease without heart failure: Secondary | ICD-10-CM

## 2011-08-05 LAB — BASIC METABOLIC PANEL
BUN: 16 mg/dL (ref 6–23)
CO2: 30 mEq/L (ref 19–32)
Calcium: 8.9 mg/dL (ref 8.4–10.5)
Chloride: 100 mEq/L (ref 96–112)
Creatinine, Ser: 1 mg/dL (ref 0.4–1.5)
GFR: 78.3 mL/min (ref >60.00)
Glucose, Bld: 109 mg/dL — ABNORMAL HIGH (ref 70–99)
Potassium: 3.7 mEq/L (ref 3.5–5.1)
Sodium: 139 mEq/L (ref 135–145)

## 2011-08-05 MED ORDER — AMLODIPINE BESYLATE 2.5 MG PO TABS: ORAL_TABLET | ORAL | Status: DC

## 2011-08-05 NOTE — Assessment & Plan Note (Signed)
This has been a chronic issue. We will place a 24 Holter monitor.

## 2011-08-05 NOTE — Assessment & Plan Note (Signed)
We will place a 24 hour heart monitor today. He has had this chronic history of bradycardia with bifascicular block. Will also check chemistries today. We will treat his blood pressure. I will see him back in 2 weeks.

## 2011-08-05 NOTE — Progress Notes (Signed)
[name] [name] Date of Birth: Mar 12, 1930 Medical Record #[id number]  History of Present Illness: [name] [name] is seen today for a work in visit. He is seen for Dr. Patty Sermons. He has a history of HTN, CAD with remote CABG/AVR in 2004. He also has chronic bradycardia with RBBB and LAHB. He now presents with a 1 to 2 week history of dizzy spells. No frank syncope. He notes that he will get dizzy with position changes. Feels like he may pass out when up and walking. Also notes that he has gotten a little staggery. He denies palpitations. His spells are not getting worse but certainly not better. He did not think he could wait until his appointment in January. Blood pressure is up. He takes his Norvasc at night. He does not check his blood pressure at home. He remains very active and has been raking/blowing leaves this fall. No chest pain. No shortness of breath.   Current Outpatient Prescriptions on File Prior to Visit  Medication Sig Dispense Refill  . aspirin 81 MG tablet Take 81 mg daily      . bimatoprost (LUMIGAN) 0.03 % ophthalmic solution 1 drop at bedtime.        . furosemide (LASIX) 80 MG tablet Take 1 tablet (80 mg total) by mouth daily.  30 tablet  11  . GuaiFENesin (MUCINEX PO) Take by mouth as needed.        . Loratadine (CLARITIN PO) Take by mouth as needed.        . potassium chloride (KLOR-CON) 20 MEQ packet Take 20 mEq by mouth daily.        Marland Kitchen DISCONTD: amLODipine (NORVASC) 2.5 MG tablet Take 2.5 mg by mouth daily.          No Known Allergies  Past Medical History  Diagnosis Date  . Aortic stenosis     s/p AVR in 2004  . Spasmodic torticollis     recent with persistant headaches  . Sinus bradycardia   . Bifascicular block   . Hypercholesterolemia     On Simvastatin  . Coronary artery disease     s/p CABG in 2004  . Right bundle branch block     Past Surgical History  Procedure Date  . Coronary artery bypass graft 10-30-02    x8 per Dr. Laneta Simmers with LIMA to LAD, SVG to  PD, SVG to 2nd DX/1st DX, & SVG to subbranch of the 1st DX & 1st OM  . Aortic valve replacement 10-30-02    insertion of a bioprosthetic #76mm AVR    History  Smoking status  . Former Smoker  Smokeless tobacco  . Not on file    History  Alcohol Use No    Family History  Problem Relation Age of Onset  . Hypertension Mother   . Hypertension Sister     Review of Systems: The review of systems is per the HPI.  He does note some issues with his memory. All other systems were reviewed and are negative.  Physical Exam: BP 170/80  Pulse 57  Ht 5\' 9"  (1.753 m)  Wt 158 lb 1.9 oz (71.723 kg)  BMI 23.35 kg/m2 Patient is very pleasant and in no acute distress. Skin is warm and dry. Color is normal.  HEENT is unremarkable except eyes are a little injected. Normocephalic/atraumatic. PERRL. Sclera are nonicteric. Neck is supple. No masses. No JVD. Lungs are clear. Cardiac exam shows a regular rate and rhythm. Soft outflow murmur. Abdomen is soft. Extremities are  without edema. Gait and ROM are intact. No gross neurologic deficits noted.  LABORATORY DATA: EKG shows sinus bradycardia. Rate is higher today at 57. Continues to have bifascicular block. Tracing is reviewed with Dr. Patty Sermons.    Assessment / Plan:

## 2011-08-05 NOTE — Patient Instructions (Signed)
I want to put a heart monitor on you for the next 24 hours to look at your heart rhythm.  I want you to increase your Norvasc to 2 pills each morning and 1 pill at night.  I have sent a new prescription to the drug store.  We will check some labs today.

## 2011-08-05 NOTE — Assessment & Plan Note (Signed)
We will increase the Norvasc to 5 mg in the am and 2.5 mg at night. I will see him back in 2 weeks to reassess.

## 2011-08-19 ENCOUNTER — Encounter: Payer: Self-pay | Admitting: Nurse Practitioner

## 2011-08-19 ENCOUNTER — Ambulatory Visit (INDEPENDENT_AMBULATORY_CARE_PROVIDER_SITE_OTHER): Payer: Medicare Other | Admitting: Nurse Practitioner

## 2011-08-19 VITALS — BP 128/70 | HR 64 | Ht 69.0 in | Wt 159.0 lb

## 2011-08-19 DIAGNOSIS — I119 Hypertensive heart disease without heart failure: Secondary | ICD-10-CM

## 2011-08-19 DIAGNOSIS — R42 Dizziness and giddiness: Secondary | ICD-10-CM

## 2011-08-19 DIAGNOSIS — I259 Chronic ischemic heart disease, unspecified: Secondary | ICD-10-CM

## 2011-08-19 NOTE — Assessment & Plan Note (Signed)
Blood pressure has improved with increase of Norvasc.

## 2011-08-19 NOTE — Progress Notes (Signed)
   [name] [name] Date of Birth: 1929/09/29 Medical Record #[id number]  History of Present Illness: [name] [name] is seen back today for a follow up visit. He is seen for Dr. Patty Sermons. He has HTN, CAD with remote CABG/AVR in 2004. I saw him for dizzy spells. 24 hour Holter was placed which shows runs of SVT and marked sinus bradycardia. Pacemaker needs to be considered per Dr. Yevonne Pax note. He has had chronic bradycardia with RBBB and LAHB. No frank syncope but did report feeling presyncopal. His blood pressure was also up at his last visit and we increased his Norvasc.   He comes in today. He still feels less staggery and he is not dizzy anymore. Blood pressure is better. He says he feels better overall. No syncope. No chest pain. He remains active with his yard work.    Current Outpatient Prescriptions on File Prior to Visit  Medication Sig Dispense Refill  . amLODipine (NORVASC) 2.5 MG tablet Take two tablets each morning and one tablet at night.  90 tablet  3  . bimatoprost (LUMIGAN) 0.03 % ophthalmic solution 1 drop at bedtime.        . furosemide (LASIX) 80 MG tablet Take 1 tablet (80 mg total) by mouth daily.  30 tablet  11  . GuaiFENesin (MUCINEX PO) Take by mouth as needed.        . Loratadine (CLARITIN PO) Take by mouth as needed.          No Known Allergies  Past Medical History  Diagnosis Date  . Aortic stenosis     s/p AVR in 2004  . Spasmodic torticollis     recent with persistant headaches  . Sinus bradycardia   . Bifascicular block   . Hypercholesterolemia     On Simvastatin  . Coronary artery disease     s/p CABG in 2004  . Right bundle branch block     Past Surgical History  Procedure Date  . Coronary artery bypass graft 10-30-02    x8 per Dr. Laneta Simmers with LIMA to LAD, SVG to PD, SVG to 2nd DX/1st DX, & SVG to subbranch of the 1st DX & 1st OM  . Aortic valve replacement 10-30-02    insertion of a bioprosthetic #55mm AVR    History  Smoking status  . Former  Smoker  Smokeless tobacco  . Not on file    History  Alcohol Use No    Family History  Problem Relation Age of Onset  . Hypertension Mother   . Hypertension Sister     Review of Systems: The review of systems is per the HPI.  All other systems were reviewed and are negative.  Physical Exam: Ht 5\' 9"  (1.753 m)  Wt 159 lb (72.122 kg)  BMI 23.48 kg/m2 Patient is very pleasant and in no acute distress. Skin is warm and dry. Color is normal.  HEENT is unremarkable. Normocephalic/atraumatic. PERRL. Sclera are nonicteric. Neck is supple. No masses. No JVD. Lungs are clear. Cardiac exam shows a regular rate and rhythm. He has a soft outflow murmur. Abdomen is soft. Extremities are without edema. Gait and ROM are intact. No gross neurologic deficits noted.   LABORATORY DATA: Holter results noted. Had profound bradycardia with runs of SVT noted per Dr. Patty Sermons.    Assessment / Plan:

## 2011-08-19 NOTE — Assessment & Plan Note (Signed)
No chest pain reported 

## 2011-08-19 NOTE — Assessment & Plan Note (Signed)
He is better clinically. He wants to hold off on a pacemaker at this time. I have talked with Dr. Patty Sermons. He will see Susann Givens in January for his regular appointment. We will continue to follow him clinically. He is to let us know if he has any recurrent episodes of dizziness. Patient is agreeable to this plan and will call if any problems develop in the interim.

## 2011-08-19 NOTE — Patient Instructions (Signed)
I think you are doing ok.  Let us know if you have any more dizzy spells. We may have to refer you for a pacemaker to help  Your blood pressure is much better. Stay on all your current medicines.  We will see you in January with Dr Patty Sermons

## 2011-08-20 ENCOUNTER — Telehealth: Payer: Self-pay | Admitting: *Deleted

## 2011-08-20 NOTE — Telephone Encounter (Signed)
Left message x 2 and patient never called back about labs or monitor.  Bonnell Public NP this past week for ov

## 2011-08-20 NOTE — Telephone Encounter (Signed)
Message copied by Burnell Blanks on Thu Aug 20, 2011  1:56 PM ------      Message from: Rosalio Macadamia      Created: Wed Aug 05, 2011  2:07 PM       Ok to report. Labs are satisfactory.

## 2011-09-03 ENCOUNTER — Encounter: Payer: Self-pay | Admitting: Cardiology

## 2011-09-11 ENCOUNTER — Encounter: Payer: Self-pay | Admitting: Cardiology

## 2011-09-11 ENCOUNTER — Ambulatory Visit (INDEPENDENT_AMBULATORY_CARE_PROVIDER_SITE_OTHER): Payer: Medicare Other | Admitting: Cardiology

## 2011-09-11 VITALS — BP 132/78 | HR 60 | Ht 69.0 in | Wt 157.0 lb

## 2011-09-11 DIAGNOSIS — R42 Dizziness and giddiness: Secondary | ICD-10-CM

## 2011-09-11 DIAGNOSIS — E7800 Pure hypercholesterolemia, unspecified: Secondary | ICD-10-CM

## 2011-09-11 DIAGNOSIS — I119 Hypertensive heart disease without heart failure: Secondary | ICD-10-CM

## 2011-09-11 DIAGNOSIS — Z9889 Other specified postprocedural states: Secondary | ICD-10-CM

## 2011-09-11 DIAGNOSIS — I259 Chronic ischemic heart disease, unspecified: Secondary | ICD-10-CM

## 2011-09-11 DIAGNOSIS — R001 Bradycardia, unspecified: Secondary | ICD-10-CM

## 2011-09-11 DIAGNOSIS — Z953 Presence of xenogenic heart valve: Secondary | ICD-10-CM

## 2011-09-11 DIAGNOSIS — Z951 Presence of aortocoronary bypass graft: Secondary | ICD-10-CM | POA: Insufficient documentation

## 2011-09-11 DIAGNOSIS — I498 Other specified cardiac arrhythmias: Secondary | ICD-10-CM

## 2011-09-11 DIAGNOSIS — I359 Nonrheumatic aortic valve disorder, unspecified: Secondary | ICD-10-CM

## 2011-09-11 NOTE — Assessment & Plan Note (Signed)
The patient has not been experiencing any exertional chest pain or angina 

## 2011-09-11 NOTE — Assessment & Plan Note (Signed)
No dizziness or syncope.  He is not on any medication which would be causing his heart rate to be slow.

## 2011-09-11 NOTE — Patient Instructions (Signed)
Your physician recommends that you continue on your current medications as directed. Please refer to the Current Medication list given to you today. Your physician wants you to follow-up in: 4 months You will receive a reminder letter in the mail two months in advance. If you don't receive a letter, please call our office to schedule the follow-up appointment.  

## 2011-09-11 NOTE — Progress Notes (Signed)
Lucas Mallow Date of Birth:  03/27/1930 [facility] [address] [place], [place]  [address] [phone]  Fax   [phone]  HPI: This pleasant 78 year old gentleman is seen for a scheduled followup office visit.  He has a past history of any heart disease and aortic stenosis.  In 2004 he underwent or aortic valve prosthesis with a bioprosthetic valve for severe aortic stenosis and also underwent coronary artery bypass graft surgery.  The patient has a history of chronic marked intrinsic sinus bradycardia with bifascicular block.  He was seen as a work in in 08/19/2011 with dizzy spells.  A 24-hour Holter was placed which showed some runs of supraventricular tachycardia and marked sinus bradycardia.  About the possible need for a pacemaker.  However the patient returns today stating that his dizziness has subsided and he wants to hold off on the pacemaker.  The dizzy spells lasted about 2 weeks and then went away.  He has felt well since.  Current Outpatient Prescriptions  Medication Sig Dispense Refill  . amLODipine (NORVASC) 2.5 MG tablet Take two tablets each morning and one tablet at night.  90 tablet  3  . aspirin 325 MG tablet Take 325 mg by mouth every other day.        . bimatoprost (LUMIGAN) 0.03 % ophthalmic solution 1 drop at bedtime.        . furosemide (LASIX) 80 MG tablet Take 1 tablet (80 mg total) by mouth daily.  30 tablet  11  . GuaiFENesin (MUCINEX PO) Take by mouth as needed.        . Loratadine (CLARITIN PO) Take by mouth as needed.        . potassium chloride SA (K-DUR,KLOR-CON) 20 MEQ tablet Take 20 mEq by mouth daily.        . rosuvastatin (CRESTOR) 5 MG tablet Take 5 mg by mouth daily.          No Known Allergies  Patient Active Problem List  Diagnoses  . Sinus bradycardia  . Ischemic heart disease  . Benign hypertensive heart disease without heart failure  . Dizziness  . S/P aortic valve replacement with bioprosthetic valve  . Hx of  CABG    History  Smoking status  . Former Smoker  Smokeless tobacco  . Not on file    History  Alcohol Use No    Family History  Problem Relation Age of Onset  . Hypertension Mother   . Hypertension Sister     Review of Systems: The patient denies any heat or cold intolerance.  No weight gain or weight loss.  The patient denies headaches or blurry vision.  There is no cough or sputum production.  The patient denies dizziness.  There is no hematuria or hematochezia.  The patient denies any muscle aches or arthritis.  The patient denies any rash.  The patient denies frequent falling or instability.  There is no history of depression or anxiety.  All other systems were reviewed and are negative.   Physical Exam: Filed Vitals:   09/11/11 1024  BP: 132/78  Pulse: 60   the general appearance reveals a well-developed well-nourished elderly gentleman in no distress.The head and neck exam reveals pupils equal and reactive.  Extraocular movements are full.  There is no scleral icterus.  The mouth and pharynx are normal.  The neck is supple.  The carotids reveal no bruits.  The jugular venous pressure is normal.  The  thyroid is not enlarged.  There is no lymphadenopathy.  The chest is clear to percussion and auscultation.  There are no rales or rhonchi.  Expansion of the chest is symmetrical.  The precordium is quiet.  The first heart sound is normal.  The second heart sound is physiologically split.  There is a grade 2/6 systolic ejection flow murmur across the prosthetic aortic valve.  No diastolic murmur.  There is no abnormal lift or heave.  The abdomen is soft and nontender.  The bowel sounds are normal.  The liver and spleen are not enlarged.  There are no abdominal masses.  There are no abdominal bruits.  Extremities reveal good pedal pulses.  There is no phlebitis or edema.  There is no cyanosis or clubbing.  Strength is normal and symmetrical in all extremities.  There is no lateralizing  weakness.  There are no sensory deficits.  The skin is warm and dry.  There is no rash.      Assessment / Plan: Continue same medication and be rechecked in 4 months for followup office visit EKG and fasting lipid panel hepatic function panel and basal metabolic panel

## 2011-09-21 ENCOUNTER — Other Ambulatory Visit: Payer: Self-pay | Admitting: *Deleted

## 2011-09-21 MED ORDER — POTASSIUM CHLORIDE CRYS ER 20 MEQ PO TBCR: 20.0000 meq | EXTENDED_RELEASE_TABLET | Freq: Every day | ORAL | Status: DC

## 2011-10-28 ENCOUNTER — Telehealth: Payer: Self-pay | Admitting: Cardiology

## 2011-10-28 NOTE — Telephone Encounter (Signed)
New Problem   Friend of the family Hector Shade   [phone]   Request return call concerning patients leg pain, please return call to number above

## 2011-10-28 NOTE — Telephone Encounter (Signed)
Went to [facility] urgent care and was told he had a cold in leg and hip.  Left leg pain.  They did Rx prednisone but concerned about leg and what is going on.  With everything going on they are concerned about him taking this.  Unable to speak to patient, no answer at home number.  Will route to Hubert Azure to see if she can follow up

## 2011-10-29 NOTE — Telephone Encounter (Signed)
His symptoms do not sound cardiac.  I do not need to see him for the symptoms.

## 2011-10-29 NOTE — Telephone Encounter (Signed)
Pt not at number listed.  I was told to call [phone] or [phone].  Both numbers had busy signals.

## 2011-10-29 NOTE — Telephone Encounter (Signed)
Pt was notified.  

## 2011-10-29 NOTE — Telephone Encounter (Signed)
[name] [name] states he was seen at the Urgent Care clinic on [facility] by [employer] on Tuesday.  He has been having pain in his left hip/lower back down to his left knee.  He is also having pain in his left shoulder.  He woke up with the pain 3 days ago.  The pain is worse with movement.  He was given prednisone for a few days and then is to start Meloxicam after he finishes the prednisone.  He wanted to make sure that this treatment is ok with Dr Patty Sermons and wants to know if Dr Patty Sermons needs to see him.

## 2011-11-23 ENCOUNTER — Other Ambulatory Visit: Payer: Self-pay | Admitting: *Deleted

## 2011-11-23 MED ORDER — AMLODIPINE BESYLATE 2.5 MG PO TABS: ORAL_TABLET | ORAL | Status: DC

## 2011-11-23 NOTE — Telephone Encounter (Signed)
Refilled amlodipine 

## 2011-12-18 ENCOUNTER — Other Ambulatory Visit: Payer: Self-pay | Admitting: *Deleted

## 2011-12-18 MED ORDER — FUROSEMIDE 80 MG PO TABS: 80.0000 mg | ORAL_TABLET | Freq: Every day | ORAL | Status: DC

## 2011-12-18 NOTE — Telephone Encounter (Signed)
Refilled furosemide

## 2011-12-25 ENCOUNTER — Telehealth: Payer: Self-pay | Admitting: Cardiology

## 2011-12-25 NOTE — Telephone Encounter (Signed)
Pt's friend blanche lumley calling re questions on meds, don't see this name on pt's hippa

## 2011-12-25 NOTE — Telephone Encounter (Signed)
Discussed with Ms.Lumley

## 2011-12-30 ENCOUNTER — Encounter: Payer: Self-pay | Admitting: Cardiology

## 2012-01-11 ENCOUNTER — Other Ambulatory Visit: Payer: Medicare Other

## 2012-01-11 ENCOUNTER — Ambulatory Visit (INDEPENDENT_AMBULATORY_CARE_PROVIDER_SITE_OTHER): Payer: Medicare Other | Admitting: Cardiology

## 2012-01-11 ENCOUNTER — Encounter: Payer: Self-pay | Admitting: Cardiology

## 2012-01-11 VITALS — BP 128/70 | HR 50 | Ht 66.0 in | Wt 163.0 lb

## 2012-01-11 DIAGNOSIS — I119 Hypertensive heart disease without heart failure: Secondary | ICD-10-CM

## 2012-01-11 DIAGNOSIS — R42 Dizziness and giddiness: Secondary | ICD-10-CM

## 2012-01-11 DIAGNOSIS — I259 Chronic ischemic heart disease, unspecified: Secondary | ICD-10-CM

## 2012-01-11 DIAGNOSIS — I498 Other specified cardiac arrhythmias: Secondary | ICD-10-CM

## 2012-01-11 DIAGNOSIS — E7800 Pure hypercholesterolemia, unspecified: Secondary | ICD-10-CM

## 2012-01-11 DIAGNOSIS — R001 Bradycardia, unspecified: Secondary | ICD-10-CM

## 2012-01-11 DIAGNOSIS — Z951 Presence of aortocoronary bypass graft: Secondary | ICD-10-CM

## 2012-01-11 NOTE — Assessment & Plan Note (Signed)
Despite his bradycardia he has not been experiencing any dizzy spells or syncope

## 2012-01-11 NOTE — Patient Instructions (Signed)
Your physician recommends that you continue on your current medications as directed. Please refer to the Current Medication list given to you today.  Your physician recommends that you schedule a follow-up appointment in: 4 months with fasting labs (lp/bmet/hfp)  

## 2012-01-11 NOTE — Assessment & Plan Note (Signed)
The patient has not been experiencing any exertional chest tightness or pressure or recurrent angina pectoris

## 2012-01-11 NOTE — Progress Notes (Signed)
[name] [name] Date of Birth:  [birth year] [facility] [address] [place], [place]  [address] [phone]  Fax   [phone]  HPI: This pleasant 78 year old gentleman is seen for a scheduled followup office visit.  He has a history of previous aortic stenosis.  In 2004 he underwent surgery for aortic valve stenosis with insertion of a bioprosthetic valve.  At the same time he underwent coronary artery bypass graft surgery.  The patient has done well postoperatively.  The patient has a history of marked chronic intrinsic sinus bradycardia with bifascicular block.  He has not had complete heart block.  He is not having any symptoms from his bradycardia. Current Outpatient Prescriptions  Medication Sig Dispense Refill  . amLODipine (NORVASC) 2.5 MG tablet Take two tablets each morning and one tablet at night.  90 tablet  3  . aspirin 325 MG tablet Take 325 mg by mouth every other day.        . bimatoprost (LUMIGAN) 0.03 % ophthalmic solution 1 drop at bedtime.        . furosemide (LASIX) 80 MG tablet Take 1 tablet (80 mg total) by mouth daily.  30 tablet  11  . GuaiFENesin (MUCINEX PO) Take by mouth as needed.        . Loratadine (CLARITIN PO) Take by mouth as needed.        . potassium chloride SA (K-DUR,KLOR-CON) 20 MEQ tablet Take 1 tablet (20 mEq total) by mouth daily.  30 tablet  8  . rosuvastatin (CRESTOR) 5 MG tablet Take 5 mg by mouth daily.          No Known Allergies  Patient Active Problem List  Diagnoses  . Sinus bradycardia  . Ischemic heart disease  . Benign hypertensive heart disease without heart failure  . Dizziness  . S/P aortic valve replacement with bioprosthetic valve  . Hx of CABG    History  Smoking status  . Former Smoker  Smokeless tobacco  . Not on file    History  Alcohol Use No    Family History  Problem Relation Age of Onset  . Hypertension Mother   . Hypertension Sister     Review of Systems: The patient denies  any heat or cold intolerance.  No weight gain or weight loss.  The patient denies headaches or blurry vision.  There is no cough or sputum production.  The patient denies dizziness.  There is no hematuria or hematochezia.  The patient denies any muscle aches or arthritis.  The patient denies any rash.  The patient denies frequent falling or instability.  There is no history of depression or anxiety.  All other systems were reviewed and are negative.   Physical Exam: Filed Vitals:   01/11/12 1030  BP: 128/70  Pulse: 50   the general appearance reveals a elderly gentleman in no distress.The head and neck exam reveals pupils equal and reactive.  Extraocular movements are full.  There is no scleral icterus.  The mouth and pharynx are normal.  The neck is supple.  The carotids reveal no bruits.  The jugular venous pressure is normal.  The  thyroid is not enlarged.  There is no lymphadenopathy.  The chest is clear to percussion and auscultation.  There are no rales or rhonchi.  Expansion of the chest is symmetrical.  The precordium is quiet.  The first heart sound is normal.  The second heart sound is physiologically split.  There is no gallop rub  or click.  There is a grade 2/6 systolic ejection murmur across the aortic valve prosthesis.  No diastolic murmur. There is no abnormal lift or heave.  The abdomen is soft and nontender.  The bowel sounds are normal.  The liver and spleen are not enlarged.  There are no abdominal masses.  There are no abdominal bruits.  Extremities reveal good pedal pulses.  There is no phlebitis or edema.  There is no cyanosis or clubbing.  Strength is normal and symmetrical in all extremities.  There is no lateralizing weakness.  There are no sensory deficits.  The skin is warm and dry.  There is no rash.   EKG shows sinus bradycardia at 50 per minute.  He has bifascicular block.  No significant change since 07/14/10   Assessment / Plan: Continue same medication.  Recheck in 4  months for followup office visit and fasting lab work

## 2012-01-11 NOTE — Assessment & Plan Note (Signed)
No dizziness or syncope. 

## 2012-01-21 ENCOUNTER — Other Ambulatory Visit: Payer: Medicare Other

## 2012-05-02 ENCOUNTER — Other Ambulatory Visit: Payer: Self-pay | Admitting: *Deleted

## 2012-05-02 MED ORDER — AMLODIPINE BESYLATE 2.5 MG PO TABS: ORAL_TABLET | ORAL | Status: DC

## 2012-05-02 NOTE — Telephone Encounter (Signed)
Refilled amlodipine 

## 2012-06-01 ENCOUNTER — Ambulatory Visit (INDEPENDENT_AMBULATORY_CARE_PROVIDER_SITE_OTHER): Payer: Medicare Other | Admitting: Cardiology

## 2012-06-01 ENCOUNTER — Encounter: Payer: Self-pay | Admitting: Cardiology

## 2012-06-01 ENCOUNTER — Other Ambulatory Visit (INDEPENDENT_AMBULATORY_CARE_PROVIDER_SITE_OTHER): Payer: Medicare Other

## 2012-06-01 VITALS — BP 142/68 | HR 56 | Ht 66.0 in | Wt 160.1 lb

## 2012-06-01 DIAGNOSIS — E7800 Pure hypercholesterolemia, unspecified: Secondary | ICD-10-CM

## 2012-06-01 DIAGNOSIS — I119 Hypertensive heart disease without heart failure: Secondary | ICD-10-CM

## 2012-06-01 DIAGNOSIS — Z954 Presence of other heart-valve replacement: Secondary | ICD-10-CM

## 2012-06-01 DIAGNOSIS — Z952 Presence of prosthetic heart valve: Secondary | ICD-10-CM

## 2012-06-01 DIAGNOSIS — I259 Chronic ischemic heart disease, unspecified: Secondary | ICD-10-CM

## 2012-06-01 DIAGNOSIS — Z953 Presence of xenogenic heart valve: Secondary | ICD-10-CM

## 2012-06-01 LAB — BASIC METABOLIC PANEL
CO2: 29 mEq/L (ref 19–32)
Calcium: 9 mg/dL (ref 8.4–10.5)
Creatinine, Ser: 0.9 mg/dL (ref 0.4–1.5)
GFR: 89.64 mL/min (ref >60.00)
Glucose, Bld: 112 mg/dL — ABNORMAL HIGH (ref 70–99)
Sodium: 139 mEq/L (ref 135–145)

## 2012-06-01 LAB — HEPATIC FUNCTION PANEL
ALT: 18 U/L (ref 0–53)
AST: 20 U/L (ref 0–37)
Albumin: 4 g/dL (ref 3.5–5.2)
Alkaline Phosphatase: 42 U/L (ref 39–117)

## 2012-06-01 LAB — LIPID PANEL
HDL: 38 mg/dL — ABNORMAL LOW (ref >39.00)
Triglycerides: 107 mg/dL (ref 0.0–149.0)

## 2012-06-01 NOTE — Patient Instructions (Addendum)

## 2012-06-01 NOTE — Progress Notes (Signed)
[name] [name] Date of Birth:  04/22/1930 [facility] [address] [place], [place]  [address] [phone]  Fax   [phone]  HPI: This pleasant 78 year old gentleman is seen for a scheduled followup office visit. He has a history of previous aortic stenosis. In 2004 he underwent surgery for aortic valve stenosis with insertion of a bioprosthetic valve. At the same time he underwent coronary artery bypass graft surgery. The patient has done well postoperatively. The patient has a history of marked chronic intrinsic sinus bradycardia with bifascicular block. He has not had complete heart block. He is not having any symptoms from his bradycardia.  His last visit he has continued to do well.  He denies any chest pain.  He's had no dizziness or syncope despite his chronic bradycardia.  He remains physically active.  His weight is down 3 pounds over the summer although his appetite remains good.  We are checking lab work today   Current Outpatient Prescriptions  Medication Sig Dispense Refill  . amLODipine (NORVASC) 2.5 MG tablet Take two tablets each morning and one tablet at night.  90 tablet  3  . aspirin 325 MG tablet Take 325 mg by mouth every other day.        . bimatoprost (LUMIGAN) 0.03 % ophthalmic solution 1 drop at bedtime.        . furosemide (LASIX) 80 MG tablet Take 1 tablet (80 mg total) by mouth daily.  30 tablet  11  . GuaiFENesin (MUCINEX PO) Take by mouth as needed.        . Loratadine (CLARITIN PO) Take by mouth as needed.        . potassium chloride SA (K-DUR,KLOR-CON) 20 MEQ tablet Take 1 tablet (20 mEq total) by mouth daily.  30 tablet  8  . rosuvastatin (CRESTOR) 5 MG tablet Take 5 mg by mouth daily.          No Known Allergies  Patient Active Problem List  Diagnosis  . Sinus bradycardia  . Ischemic heart disease  . Benign hypertensive heart disease without heart failure  . Dizziness  . S/P aortic valve replacement with bioprosthetic valve   . Hx of CABG    History  Smoking status  . Former Smoker  Smokeless tobacco  . Not on file    History  Alcohol Use No    Family History  Problem Relation Age of Onset  . Hypertension Mother   . Hypertension Sister     Review of Systems: The patient denies any heat or cold intolerance.  No weight gain or weight loss.  The patient denies headaches or blurry vision.  There is no cough or sputum production.  The patient denies dizziness.  There is no hematuria or hematochezia.  The patient denies any muscle aches or arthritis.  The patient denies any rash.  The patient denies frequent falling or instability.  There is no history of depression or anxiety.  All other systems were reviewed and are negative.   Physical Exam: Filed Vitals:   06/01/12 0925  BP: 142/68  Pulse: 56   general appearance reveals an alert elderly gentleman in no distress.The head and neck exam reveals pupils equal and reactive.  Extraocular movements are full.  There is no scleral icterus.  The mouth and pharynx are normal.  The neck is supple.  The carotids reveal no bruits.  The jugular venous pressure is normal.  The  thyroid is not enlarged.  There is no lymphadenopathy.  The chest is clear to percussion and auscultation.  There are no rales or rhonchi.  Expansion of the chest is symmetrical.  The precordium is quiet.  The first heart sound is normal.  The second heart sound is physiologically split.  There is a soft systolic ejection murmur across the prosthetic aortic valve.  There is no abnormal lift or heave.  The abdomen is soft and nontender.  The bowel sounds are normal.  The liver and spleen are not enlarged.  There are no abdominal masses.  There are no abdominal bruits.  Extremities reveal good pedal pulses.  There is no phlebitis or edema.  There is no cyanosis or clubbing.  Strength is normal and symmetrical in all extremities.  There is no lateralizing weakness.  There are no sensory deficits.  The  skin is warm and dry.  There is no rash.      Assessment / Plan:  Continue same medication.  Recheck in 4 months for office visit EKG and fasting lab work

## 2012-06-01 NOTE — Assessment & Plan Note (Addendum)
The patient has not been having any recurrent angina pectoris.  He remains on Crestor 5 mg daily which she is tolerating well

## 2012-06-01 NOTE — Assessment & Plan Note (Signed)
The patient has had no symptoms to suggest dysfunction of his aortic valve bioprosthetic valve

## 2012-09-08 ENCOUNTER — Other Ambulatory Visit: Payer: Self-pay | Admitting: *Deleted

## 2012-09-08 MED ORDER — AMLODIPINE BESYLATE 2.5 MG PO TABS: ORAL_TABLET | ORAL | Status: DC

## 2012-09-08 MED ORDER — POTASSIUM CHLORIDE CRYS ER 20 MEQ PO TBCR: 20.0000 meq | EXTENDED_RELEASE_TABLET | Freq: Every day | ORAL | Status: DC

## 2012-09-08 NOTE — Telephone Encounter (Signed)
Fax Received. Refill Completed. [name] [name] (R.M.A)   

## 2013-01-25 ENCOUNTER — Other Ambulatory Visit: Payer: Self-pay | Admitting: *Deleted

## 2013-01-25 MED ORDER — AMLODIPINE BESYLATE 2.5 MG PO TABS: ORAL_TABLET | ORAL | Status: DC

## 2013-02-06 ENCOUNTER — Other Ambulatory Visit: Payer: Self-pay | Admitting: *Deleted

## 2013-02-06 MED ORDER — FUROSEMIDE 80 MG PO TABS: 80.0000 mg | ORAL_TABLET | Freq: Every day | ORAL | Status: DC

## 2013-02-24 ENCOUNTER — Encounter: Payer: Self-pay | Admitting: Cardiology

## 2013-03-09 ENCOUNTER — Ambulatory Visit (INDEPENDENT_AMBULATORY_CARE_PROVIDER_SITE_OTHER): Payer: Medicare Other | Admitting: Cardiology

## 2013-03-09 ENCOUNTER — Encounter: Payer: Self-pay | Admitting: Cardiology

## 2013-03-09 VITALS — BP 140/70 | HR 58 | Ht 63.0 in | Wt 155.8 lb

## 2013-03-09 DIAGNOSIS — E7800 Pure hypercholesterolemia, unspecified: Secondary | ICD-10-CM

## 2013-03-09 DIAGNOSIS — Z951 Presence of aortocoronary bypass graft: Secondary | ICD-10-CM

## 2013-03-09 DIAGNOSIS — R001 Bradycardia, unspecified: Secondary | ICD-10-CM

## 2013-03-09 DIAGNOSIS — I498 Other specified cardiac arrhythmias: Secondary | ICD-10-CM

## 2013-03-09 DIAGNOSIS — I259 Chronic ischemic heart disease, unspecified: Secondary | ICD-10-CM

## 2013-03-09 DIAGNOSIS — Z953 Presence of xenogenic heart valve: Secondary | ICD-10-CM

## 2013-03-09 DIAGNOSIS — Z952 Presence of prosthetic heart valve: Secondary | ICD-10-CM

## 2013-03-09 LAB — LIPID PANEL
HDL: 43.3 mg/dL (ref >39.00)
LDL Cholesterol: 78 mg/dL (ref 0–99)
Total CHOL/HDL Ratio: 3
Triglycerides: 84 mg/dL (ref 0.0–149.0)

## 2013-03-09 LAB — BASIC METABOLIC PANEL
CO2: 32 mEq/L (ref 19–32)
Chloride: 100 mEq/L (ref 96–112)
Potassium: 3.7 mEq/L (ref 3.5–5.1)
Sodium: 141 mEq/L (ref 135–145)

## 2013-03-09 LAB — HEPATIC FUNCTION PANEL
AST: 19 U/L (ref 0–37)
Albumin: 4.3 g/dL (ref 3.5–5.2)

## 2013-03-09 NOTE — Progress Notes (Signed)
[name] [name] Date of Birth:  1930-03-20 [facility] [address] [place], [place]  [address] [phone]         Fax   [phone]  History of Present Illness: This pleasant 79 year old gentleman is seen for a scheduled followup office visit. He has a history of previous aortic stenosis. In 2004 he underwent surgery for aortic valve stenosis with insertion of a bioprosthetic valve. At the same time he underwent coronary artery bypass graft surgery. The patient has done well postoperatively. The patient has a history of marked chronic intrinsic sinus bradycardia with bifascicular block. He has not had complete heart block. He is not having any symptoms from his bradycardia. His last visit he has continued to do well. He denies any chest pain. He's had no dizziness or syncope despite his chronic bradycardia. He remains physically active. His weight is down 5 pounds since last visit in September 2013 although his appetite remains good. We are checking lab work today.  Since we last saw him his girlfriend of 17 years, Letitia Libra, died.  She does not have any living relatives.  He does attend church which provides a supportive group of friends for him.   Current Outpatient Prescriptions  Medication Sig Dispense Refill  . amLODipine (NORVASC) 2.5 MG tablet Take two tablets each morning and one tablet at night.  90 tablet  5  . aspirin 81 MG tablet Take 81 mg by mouth daily.      . bimatoprost (LUMIGAN) 0.03 % ophthalmic solution 1 drop at bedtime.        . furosemide (LASIX) 80 MG tablet Take 1 tablet (80 mg total) by mouth daily.  30 tablet  1  . GuaiFENesin (MUCINEX PO) Take by mouth as needed.        . Loratadine (CLARITIN PO) Take by mouth as needed.        . potassium chloride SA (K-DUR,KLOR-CON) 20 MEQ tablet Take 1 tablet (20 mEq total) by mouth daily.  30 tablet  6  . rosuvastatin (CRESTOR) 5 MG tablet Take 5 mg by mouth daily.         No current  facility-administered medications for this visit.    No Known Allergies  Patient Active Problem List   Diagnosis Date Noted  . S/P aortic valve replacement with bioprosthetic valve 09/11/2011  . Hx of CABG 09/11/2011  . Dizziness 08/05/2011  . Sinus bradycardia 05/06/2011  . Ischemic heart disease 05/06/2011  . Benign hypertensive heart disease without heart failure 05/06/2011    History  Smoking status  . Former Smoker  Smokeless tobacco  . Not on file    History  Alcohol Use No    Family History  Problem Relation Age of Onset  . Hypertension Mother   . Hypertension Sister     Review of Systems: Constitutional: no fever chills diaphoresis or fatigue or change in weight.  Head and neck: no hearing loss, no epistaxis, no photophobia or visual disturbance. Respiratory: No cough, shortness of breath or wheezing. Cardiovascular: No chest pain peripheral edema, palpitations. Gastrointestinal: No abdominal distention, no abdominal pain, no change in bowel habits hematochezia or melena. Genitourinary: No dysuria, no frequency, no urgency, no nocturia. Musculoskeletal:No arthralgias, no back pain, no gait disturbance or myalgias. Neurological: No dizziness, no headaches, no numbness, no seizures, no syncope, no weakness, no tremors. Hematologic: No lymphadenopathy, no easy bruising. Psychiatric: No confusion, no hallucinations, no sleep disturbance.    Physical Exam: Filed Vitals:  03/09/13 1518  BP: 140/70  Pulse: 58   the general appearance reveals a well-developed well-nourished elderly gentleman in no distress.The head and neck exam reveals pupils equal and reactive.  Extraocular movements are full.  There is no scleral icterus.  The mouth and pharynx are normal.  The neck is supple.  The carotids reveal no bruits.  The jugular venous pressure is normal.  The  thyroid is not enlarged.  There is no lymphadenopathy.  The chest is clear to percussion and auscultation.   There are no rales or rhonchi.  Expansion of the chest is symmetrical.  The precordium is quiet.  The first heart sound is normal.  The second heart sound is physiologically split.  There is a soft systolic ejection murmur across the prosthetic aortic valve.  There is no diastolic murmur..  There is no abnormal lift or heave.  The abdomen is soft and nontender.  The bowel sounds are normal.  The liver and spleen are not enlarged.  There are no abdominal masses.  There are no abdominal bruits.  Extremities reveal good pedal pulses.  There is no phlebitis or edema.  There is no cyanosis or clubbing.  Strength is normal and symmetrical in all extremities.  There is no lateralizing weakness.  There are no sensory deficits.  The skin is warm and dry.  There is no rash.     Assessment / Plan: Continue on same medication.  He will take baby aspirin 81 mg daily instead of a full aspirin because of easy bruising.  We are checking blood work today. Recheck 6 months for followup office visit and lipid panel hepatic function panel and basal metabolic panel

## 2013-03-09 NOTE — Patient Instructions (Addendum)
Will obtain labs today and call you with the results (lp/bmet/hfp)  Decrease your Aspirin to 81 mg daily  Your physician wants you to follow-up in: 6 months with fasting labs (lp/bmet/hfp)  You will receive a reminder letter in the mail two months in advance. If you don't receive a letter, please call our office to schedule the follow-up appointment.

## 2013-03-09 NOTE — Progress Notes (Signed)
Quick Note:  Please report to patient. The recent labs are stable. Continue same medication and careful diet. ______ 

## 2013-03-09 NOTE — Assessment & Plan Note (Signed)
No symptoms from his chronic sinus bradycardia.  No dizziness or syncope.

## 2013-03-09 NOTE — Assessment & Plan Note (Signed)
The patient has not been experiencing any recurrent symptoms of CHF

## 2013-03-09 NOTE — Assessment & Plan Note (Signed)
The patient has had no recurrent chest pain or angina 

## 2013-03-14 ENCOUNTER — Telehealth: Payer: Self-pay | Admitting: Cardiology

## 2013-03-14 NOTE — Telephone Encounter (Signed)
Follow up  Pt is returning your call regarding his lab results.

## 2013-03-14 NOTE — Telephone Encounter (Signed)
Message copied by Burnell Blanks on Tue Mar 14, 2013  2:42 PM ------      Message from: Cassell Clement      Created: Thu Mar 09, 2013  9:13 PM       Please report to patient.  The recent labs are stable. Continue same medication and careful diet. ------

## 2013-03-14 NOTE — Telephone Encounter (Signed)
Advised patient of lab results  

## 2013-03-17 ENCOUNTER — Other Ambulatory Visit: Payer: Self-pay | Admitting: Surgery

## 2013-04-25 ENCOUNTER — Other Ambulatory Visit: Payer: Self-pay | Admitting: Cardiovascular Disease

## 2013-04-25 ENCOUNTER — Other Ambulatory Visit: Payer: Self-pay | Admitting: Cardiology

## 2013-04-27 ENCOUNTER — Other Ambulatory Visit: Payer: Self-pay | Admitting: Cardiology

## 2013-04-28 ENCOUNTER — Other Ambulatory Visit: Payer: Self-pay | Admitting: Physician Assistant

## 2013-07-13 ENCOUNTER — Other Ambulatory Visit: Payer: Self-pay | Admitting: Cardiology

## 2013-07-29 ENCOUNTER — Observation Stay (HOSPITAL_COMMUNITY)
Admission: EM | Admit: 2013-07-29 | Discharge: 2013-07-31 | Disposition: A | Discharge status: Home / Self Care | Payer: Medicare Other | Attending: Internal Medicine | Admitting: Internal Medicine

## 2013-07-29 ENCOUNTER — Encounter (HOSPITAL_COMMUNITY): Payer: Self-pay | Admitting: Emergency Medicine

## 2013-07-29 DIAGNOSIS — E7800 Pure hypercholesterolemia, unspecified: Secondary | ICD-10-CM | POA: Insufficient documentation

## 2013-07-29 DIAGNOSIS — M5410 Radiculopathy, site unspecified: Secondary | ICD-10-CM

## 2013-07-29 DIAGNOSIS — M47817 Spondylosis without myelopathy or radiculopathy, lumbosacral region: Principal | ICD-10-CM | POA: Insufficient documentation

## 2013-07-29 DIAGNOSIS — Z87891 Personal history of nicotine dependence: Secondary | ICD-10-CM | POA: Insufficient documentation

## 2013-07-29 DIAGNOSIS — I714 Abdominal aortic aneurysm, without rupture, unspecified: Secondary | ICD-10-CM | POA: Insufficient documentation

## 2013-07-29 DIAGNOSIS — I2510 Atherosclerotic heart disease of native coronary artery without angina pectoris: Secondary | ICD-10-CM | POA: Insufficient documentation

## 2013-07-29 DIAGNOSIS — Z951 Presence of aortocoronary bypass graft: Secondary | ICD-10-CM

## 2013-07-29 DIAGNOSIS — M79609 Pain in unspecified limb: Secondary | ICD-10-CM | POA: Insufficient documentation

## 2013-07-29 DIAGNOSIS — M549 Dorsalgia, unspecified: Secondary | ICD-10-CM | POA: Diagnosis present

## 2013-07-29 DIAGNOSIS — Z954 Presence of other heart-valve replacement: Secondary | ICD-10-CM | POA: Insufficient documentation

## 2013-07-29 DIAGNOSIS — Z953 Presence of xenogenic heart valve: Secondary | ICD-10-CM

## 2013-07-29 DIAGNOSIS — M5430 Sciatica, unspecified side: Secondary | ICD-10-CM | POA: Insufficient documentation

## 2013-07-29 DIAGNOSIS — Q762 Congenital spondylolisthesis: Secondary | ICD-10-CM | POA: Insufficient documentation

## 2013-07-29 DIAGNOSIS — M79605 Pain in left leg: Secondary | ICD-10-CM | POA: Diagnosis present

## 2013-07-29 LAB — BASIC METABOLIC PANEL
BUN: 15 mg/dL (ref 6–23)
Calcium: 9 mg/dL (ref 8.4–10.5)
GFR calc Af Amer: 88 mL/min — ABNORMAL LOW (ref >90)
GFR calc non Af Amer: 76 mL/min — ABNORMAL LOW (ref >90)
Glucose, Bld: 190 mg/dL — ABNORMAL HIGH (ref 70–99)
Potassium: 4.2 mEq/L (ref 3.5–5.1)
Sodium: 129 mEq/L — ABNORMAL LOW (ref 135–145)

## 2013-07-29 LAB — CBC WITH DIFFERENTIAL/PLATELET
Basophils Absolute: 0 10*3/uL (ref 0.0–0.1)
Basophils Relative: 0 % (ref 0–1)
Hemoglobin: 12.4 g/dL — ABNORMAL LOW (ref 13.0–17.0)
Lymphocytes Relative: 7 % — ABNORMAL LOW (ref 12–46)
MCHC: 35.4 g/dL (ref 30.0–36.0)
Monocytes Relative: 7 % (ref 3–12)
Neutro Abs: 8 10*3/uL — ABNORMAL HIGH (ref 1.7–7.7)
Neutrophils Relative %: 86 % — ABNORMAL HIGH (ref 43–77)

## 2013-07-29 MED ORDER — METHOCARBAMOL 100 MG/ML IJ SOLN
1000.0000 mg | Freq: Once | INTRAMUSCULAR | Status: DC
  Filled 2013-07-29: qty 10

## 2013-07-29 MED ORDER — MORPHINE SULFATE 4 MG/ML IJ SOLN
4.0000 mg | Freq: Once | INTRAMUSCULAR | Status: AC
  Administered 2013-07-30: 4 mg via INTRAVENOUS
  Filled 2013-07-29: qty 1

## 2013-07-29 NOTE — ED Provider Notes (Signed)
CSN: [id number]     Arrival date & time 07/29/13  2036 History   First MD Initiated Contact with Patient 07/29/13 2258     Chief Complaint  Patient presents with  . Leg Pain   (Consider location/radiation/quality/duration/timing/severity/associated sxs/prior Treatment) HPI 79 year old male presents to emergency department from home via EMS with complaint of pain in his left back, extending down.  His left leg.  Symptoms have been present for the last 3-4 days, but worsened today.  He is unable to walk due to severe pain.  No trauma noted.  Pain is a burning, shooting sensation.  Patient also complaining of bilateral groin pain.  He has history of left inguinal hernia from 2004, but has been not medically sound to have it repaired.  Over the last month after raking leaves, he noticed increasing bulge in his right groin.  Patient thinks that his left hernia is now in his right groin.  Patient denies previous history of back pain.  He denies any weight loss, no bowel or bladder incontinence or retention.  He is taking no medications prior to arrival.  No foot drop, no weakness of the leg.  No fevers.  No night sweats. Past Medical History  Diagnosis Date  . Aortic stenosis     s/p AVR in 2004  . Spasmodic torticollis     recent with persistant headaches  . Sinus bradycardia   . Bifascicular block   . Hypercholesterolemia     On Simvastatin  . Coronary artery disease     s/p CABG in 2004  . Right bundle branch block   . Abnormal Holter monitor finding November 2012    Runs of SVT with profound bradycardia   Past Surgical History  Procedure Laterality Date  . Coronary artery bypass graft  10-30-02    x8 per Dr. Laneta Simmers with LIMA to LAD, SVG to PD, SVG to 2nd DX/1st DX, & SVG to subbranch of the 1st DX & 1st OM  . Aortic valve replacement  10-30-02    insertion of a bioprosthetic #18mm AVR   Family History  Problem Relation Age of Onset  . Hypertension Mother   . Hypertension Sister     History  Substance Use Topics  . Smoking status: Former Games developer  . Smokeless tobacco: Not on file  . Alcohol Use: No    Review of Systems  See History of Present Illness; otherwise all other systems are reviewed and negative  Allergies  Review of patient's allergies indicates no known allergies.  Home Medications   Current Outpatient Rx  Name  Route  Sig  Dispense  Refill  . amLODipine (NORVASC) 2.5 MG tablet   Oral   Take 2.5-5 mg by mouth 2 (two) times daily. Takes 5mg  in morning and 2.5mg  at bedtime         . aspirin EC 325 MG tablet   Oral   Take 325 mg by mouth every other day.         . bimatoprost (LUMIGAN) 0.03 % ophthalmic solution   Both Eyes   Place 1 drop into both eyes at bedtime.          . brimonidine (ALPHAGAN P) 0.1 % SOLN   Both Eyes   Place 1 drop into both eyes every evening.         . furosemide (LASIX) 80 MG tablet   Oral   Take 80 mg by mouth daily.         Marland Kitchen guaiFENesin (  MUCINEX) 600 MG 12 hr tablet   Oral   Take 600 mg by mouth daily as needed for to loosen phlegm.         . loratadine (CLARITIN) 10 MG tablet   Oral   Take 10 mg by mouth daily as needed for allergies.         . potassium chloride SA (K-DUR,KLOR-CON) 20 MEQ tablet   Oral   Take 20 mEq by mouth daily.         . rosuvastatin (CRESTOR) 5 MG tablet   Oral   Take 5 mg by mouth daily.            BP 152/70  Pulse 86  Temp(Src) 97.8 F (36.6 C) (Oral)  Resp 17  SpO2 100% Physical Exam  Nursing note and vitals reviewed. Constitutional: He is oriented to person, place, and time. He appears distressed (uncomfortable appearing).  Frail elderly male  HENT:  Head: Normocephalic and atraumatic.  Nose: Nose normal.  Mouth/Throat: Oropharynx is clear and moist.  Neck: Normal range of motion. Neck supple. No JVD present. No tracheal deviation present. No thyromegaly present.  Cardiovascular: Normal rate, regular rhythm and intact distal pulses.  Exam  reveals no gallop and no friction rub.   Murmur heard. Pulmonary/Chest: Effort normal and breath sounds normal. No stridor. No respiratory distress. He has no wheezes. He has no rales. He exhibits no tenderness.  Abdominal: Soft. Bowel sounds are normal. He exhibits mass (patient has bilateral reducible inguinal hernias). He exhibits no distension. There is no tenderness. There is no rebound and no guarding.  Musculoskeletal: He exhibits tenderness. He exhibits no edema.  He has decreased range of motion to back and left leg secondary to pain.  He is unable to flex or extend at his lower back.  He has positive straight leg raise on the left.  Lymphadenopathy:    He has no cervical adenopathy.  Neurological: He is alert and oriented to person, place, and time. He has normal reflexes. He displays normal reflexes. No cranial nerve deficit. He exhibits normal muscle tone.  Skin: Skin is warm and dry. No rash noted. No erythema. No pallor.  Psychiatric: He has a normal mood and affect. His behavior is normal. Judgment and thought content normal.    ED Course  Procedures (including critical care time) Labs Review Labs Reviewed  BASIC METABOLIC PANEL - Abnormal; Notable for the following:    Sodium 129 (*)    Chloride 93 (*)    Glucose, Bld 190 (*)    GFR calc non Af Amer 76 (*)    GFR calc Af Amer 88 (*)    All other components within normal limits  CBC WITH DIFFERENTIAL - Abnormal; Notable for the following:    RBC 3.81 (*)    Hemoglobin 12.4 (*)    HCT 35.0 (*)    Neutrophils Relative % 86 (*)    Neutro Abs 8.0 (*)    Lymphocytes Relative 7 (*)    All other components within normal limits  URINALYSIS, ROUTINE W REFLEX MICROSCOPIC   Imaging Review No results found.  EKG Interpretation    Date/Time:  Saturday July 29 2013 20:46:28 EST Ventricular Rate:  83 PR Interval:  204 QRS Duration: 142 QT Interval:  398 QTC Calculation: 468 R Axis:   -93 Text Interpretation:  Sinus  rhythm RBBB and LAFB Confirmed by Bebe Shaggy  MD, DONALD [phone]) on 07/29/2013 9:20:42 PM  MDM   1. Radicular leg pain    79 year old male with left back pain extending down his leg ongoing for 2-3 days.  The retroflex raise on history or physical.  Pain appears to be from sciatica.  Will check plain films of lumbar region.  Patient also noted to have bilateral inguinal hernias, but these are reducible.  We'll try to relieve pain, and get patient home.  Unable to control pain despite iv medications, pt unable to stand.  D/w hospitalist for admission, pain control, possible MRI in am.    Olivia Mackie, MD 07/30/13 [phone]

## 2013-07-29 NOTE — ED Notes (Addendum)
Per EMS pt c/o left leg pain that has been going on for two days, per EMS pt states the pain is increasing and he cannot bare weight on his left leg anymore. Per EMS pt states the pain starts in his lower back and travels down his left outer thigh to his knee. Pt states he has a hernia in his right groin that is increasing in pain, pt states the hernia use to be on his left side but has moved. Pt also c/o he has left scrotum pain and inflammation as well. Pt has a cardiac hx.

## 2013-07-30 ENCOUNTER — Observation Stay (HOSPITAL_COMMUNITY): Payer: Medicare Other

## 2013-07-30 ENCOUNTER — Encounter (HOSPITAL_COMMUNITY): Payer: Self-pay | Admitting: Emergency Medicine

## 2013-07-30 ENCOUNTER — Emergency Department (HOSPITAL_COMMUNITY): Payer: Medicare Other

## 2013-07-30 DIAGNOSIS — Z951 Presence of aortocoronary bypass graft: Secondary | ICD-10-CM

## 2013-07-30 DIAGNOSIS — M549 Dorsalgia, unspecified: Secondary | ICD-10-CM | POA: Diagnosis present

## 2013-07-30 DIAGNOSIS — IMO0002 Reserved for concepts with insufficient information to code with codable children: Secondary | ICD-10-CM

## 2013-07-30 DIAGNOSIS — Z952 Presence of prosthetic heart valve: Secondary | ICD-10-CM

## 2013-07-30 DIAGNOSIS — M79605 Pain in left leg: Secondary | ICD-10-CM | POA: Diagnosis present

## 2013-07-30 LAB — COMPREHENSIVE METABOLIC PANEL
AST: 13 U/L (ref 0–37)
Albumin: 3.4 g/dL — ABNORMAL LOW (ref 3.5–5.2)
Alkaline Phosphatase: 62 U/L (ref 39–117)
BUN: 14 mg/dL (ref 6–23)
Calcium: 8.8 mg/dL (ref 8.4–10.5)
Creatinine, Ser: 0.78 mg/dL (ref 0.50–1.35)
GFR calc Af Amer: >90 mL/min (ref >90)
GFR calc non Af Amer: 82 mL/min — ABNORMAL LOW (ref >90)

## 2013-07-30 LAB — CBC
Hemoglobin: 12.5 g/dL — ABNORMAL LOW (ref 13.0–17.0)
MCH: 32.1 pg (ref 26.0–34.0)
MCV: 91.3 fL (ref 78.0–100.0)
RBC: 3.89 MIL/uL — ABNORMAL LOW (ref 4.22–5.81)
RDW: 13.9 % (ref 11.5–15.5)
WBC: 8.2 10*3/uL (ref 4.0–10.5)

## 2013-07-30 LAB — URINALYSIS, ROUTINE W REFLEX MICROSCOPIC
Hgb urine dipstick: NEGATIVE
Leukocytes, UA: NEGATIVE
Nitrite: NEGATIVE
Specific Gravity, Urine: 1.024 (ref 1.005–1.030)
Urobilinogen, UA: 1 mg/dL (ref 0.0–1.0)

## 2013-07-30 MED ORDER — ONDANSETRON HCL 4 MG PO TABS: 4.0000 mg | ORAL_TABLET | Freq: Four times a day (QID) | ORAL | Status: DC | PRN

## 2013-07-30 MED ORDER — ONDANSETRON HCL 4 MG/2ML IJ SOLN: 4.0000 mg | Freq: Four times a day (QID) | INTRAMUSCULAR | Status: DC | PRN

## 2013-07-30 MED ORDER — AMLODIPINE BESYLATE 2.5 MG PO TABS
5.0000 mg | ORAL_TABLET | Freq: Every day | ORAL | Status: DC
  Administered 2013-07-30 – 2013-07-31 (×2): 5 mg via ORAL
  Filled 2013-07-30 (×2): qty 2

## 2013-07-30 MED ORDER — HYDROMORPHONE HCL PF 1 MG/ML IJ SOLN: 0.5000 mg | INTRAMUSCULAR | Status: DC | PRN

## 2013-07-30 MED ORDER — HEPARIN SODIUM (PORCINE) 5000 UNIT/ML IJ SOLN
5000.0000 [IU] | Freq: Three times a day (TID) | INTRAMUSCULAR | Status: DC
  Administered 2013-07-30 – 2013-07-31 (×5): 5000 [IU] via SUBCUTANEOUS
  Filled 2013-07-30 (×7): qty 1

## 2013-07-30 MED ORDER — METHOCARBAMOL 100 MG/ML IJ SOLN
1000.0000 mg | Freq: Once | INTRAVENOUS | Status: AC
  Administered 2013-07-30: 1000 mg via INTRAVENOUS
  Filled 2013-07-30: qty 10

## 2013-07-30 MED ORDER — FUROSEMIDE 80 MG PO TABS
80.0000 mg | ORAL_TABLET | Freq: Every day | ORAL | Status: DC
  Administered 2013-07-30 – 2013-07-31 (×2): 80 mg via ORAL
  Filled 2013-07-30 (×2): qty 1

## 2013-07-30 MED ORDER — IBUPROFEN 400 MG PO TABS
400.0000 mg | ORAL_TABLET | Freq: Four times a day (QID) | ORAL | Status: DC
  Administered 2013-07-30 – 2013-07-31 (×4): 400 mg via ORAL
  Filled 2013-07-30 (×5): qty 1

## 2013-07-30 MED ORDER — ASPIRIN EC 325 MG PO TBEC
325.0000 mg | DELAYED_RELEASE_TABLET | ORAL | Status: DC
  Administered 2013-07-30: 11:00:00 325 mg via ORAL
  Filled 2013-07-30: qty 1

## 2013-07-30 MED ORDER — AMLODIPINE BESYLATE 2.5 MG PO TABS
2.5000 mg | ORAL_TABLET | Freq: Every day | ORAL | Status: DC
  Administered 2013-07-30: 23:00:00 2.5 mg via ORAL
  Filled 2013-07-30 (×2): qty 1

## 2013-07-30 MED ORDER — PREDNISONE 20 MG PO TABS
60.0000 mg | ORAL_TABLET | Freq: Once | ORAL | Status: AC
  Administered 2013-07-30: 60 mg via ORAL
  Filled 2013-07-30: qty 3

## 2013-07-30 MED ORDER — CYCLOBENZAPRINE HCL 10 MG PO TABS
10.0000 mg | ORAL_TABLET | Freq: Three times a day (TID) | ORAL | Status: DC | PRN
  Administered 2013-07-30: 10 mg via ORAL
  Filled 2013-07-30: qty 1

## 2013-07-30 MED ORDER — ATORVASTATIN CALCIUM 40 MG PO TABS
40.0000 mg | ORAL_TABLET | Freq: Every day | ORAL | Status: DC
  Administered 2013-07-30 – 2013-07-31 (×2): 40 mg via ORAL
  Filled 2013-07-30 (×2): qty 1

## 2013-07-30 NOTE — Progress Notes (Signed)
Called ED for report. Spoke with Kriste Basque, Charity fundraiser.

## 2013-07-30 NOTE — ED Notes (Signed)
Daughter left and informed she would like to be called with an update.

## 2013-07-30 NOTE — ED Notes (Signed)
Admitting MD at bedside.

## 2013-07-30 NOTE — Progress Notes (Signed)
PATIENT DETAILS Name: [name] [name] Age: 79 y.o. Sex: male Date of Birth: 1930-03-29 Admit Date: 07/29/2013 Admitting Physician Lynden Oxford, MD [id number],WJX Maisie Fus, MD  Subjective: Admitted with worsening back pain that radiates down the left leg. No urinary/fecal incontinence, no muscle weakness no sensation loss. No fever as well.  Assessment/Plan: Principal Problem: Suspected sciatica - Await MRI lumbosacral spine, get PT eval - Will start on anti-inflammatories, muscle relaxants. As needed narcotics - Benign neurological exam.  Active Problems:  History of aortic stenosis- S/P aortic valve replacement with bioprosthetic valve - Stable  History of CAD status post CABG - Stable - Continue with aspirin, statin  History of hypertension - Controlled with amlodipine and Lasix  History of dyslipidemia - Continue with statin  Disposition: Remain inpatient  DVT Prophylaxis: Prophylactic Heparin   Code Status: Full code   Family Communication None at bedside  Procedures:  None  CONSULTS:  None   MEDICATIONS: Scheduled Meds: . amLODipine  2.5 mg Oral QHS  . amLODipine  5 mg Oral Daily  . aspirin EC  325 mg Oral QODAY  . atorvastatin  40 mg Oral q1800  . furosemide  80 mg Oral Daily  . heparin  5,000 Units Subcutaneous Q8H   Continuous Infusions:  PRN Meds:.cyclobenzaprine, HYDROmorphone (DILAUDID) injection, ondansetron (ZOFRAN) IV, ondansetron  Antibiotics: Anti-infectives   None       PHYSICAL EXAM: Vital signs in last 24 hours: Filed Vitals:   07/30/13 0015 07/30/13 0215 07/30/13 0245 07/30/13 0300  BP: 119/59 115/51 120/63 168/80  Pulse: 66 59 62 67  Temp:    97.9 F (36.6 C)  TempSrc:    Oral  Resp: 15 14 16 20   Height:    5\' 7"  (1.702 m)  Weight:    69.8 kg (153 lb 14.1 oz)  SpO2: 94% 95% 97% 99%    Weight change:  Filed Weights   07/30/13 0300  Weight: 69.8 kg (153 lb 14.1 oz)   Body mass index is 24.1 kg/(m^2).    Gen Exam: Awake and alert with clear speech.   Neck: Supple, No JVD.   Chest: B/L Clear.   CVS: S1 S2 Regular, no murmurs.  Abdomen: soft, BS +, non tender, non distended.  Extremities: no edema, lower extremities warm to touch. Neurologic: Non Focal.   Skin: No Rash.   Wounds: N/A.   Intake/Output from previous day: No intake or output data in the 24 hours ending 07/30/13 1025   LAB RESULTS: CBC  Recent Labs Lab 07/29/13 2224 07/30/13 0855  WBC 9.3 8.2  HGB 12.4* 12.5*  HCT 35.0* 35.5*  PLT 151 157  MCV 91.9 91.3  MCH 32.5 32.1  MCHC 35.4 35.2  RDW 14.0 13.9  LYMPHSABS 0.7  --   MONOABS 0.6  --   EOSABS 0.0  --   BASOSABS 0.0  --     Chemistries   Recent Labs Lab 07/29/13 2224 07/30/13 0855  NA 129* 134*  K 4.2 3.9  CL 93* 96  CO2 30 28  GLUCOSE 190* 214*  BUN 15 14  CREATININE 0.94 0.78  CALCIUM 9.0 8.8    CBG: No results found for this basename: GLUCAP,  in the last 168 hours  GFR Estimated Creatinine Clearance: 67.7 ml/min (by C-G formula based on Cr of 0.78).  Coagulation profile No results found for this basename: INR, PROTIME,  in the last 168 hours  Cardiac Enzymes No results found for this basename: CK, CKMB, TROPONINI, MYOGLOBIN,  in the last 168 hours  No components found with this basename: POCBNP,  No results found for this basename: DDIMER,  in the last 72 hours No results found for this basename: HGBA1C,  in the last 72 hours No results found for this basename: CHOL, HDL, LDLCALC, TRIG, CHOLHDL, LDLDIRECT,  in the last 72 hours No results found for this basename: TSH, T4TOTAL, FREET3, T3FREE, THYROIDAB,  in the last 72 hours No results found for this basename: VITAMINB12, FOLATE, FERRITIN, TIBC, IRON, RETICCTPCT,  in the last 72 hours No results found for this basename: LIPASE, AMYLASE,  in the last 72 hours  Urine Studies No results found for this basename: UACOL, UAPR, USPG, UPH, UTP, UGL, UKET, UBIL, UHGB, UNIT, UROB, ULEU,  UEPI, UWBC, URBC, UBAC, CAST, CRYS, UCOM, BILUA,  in the last 72 hours  MICROBIOLOGY: No results found for this or any previous visit (from the past 240 hour(s)).  RADIOLOGY STUDIES/RESULTS: Dg Lumbar Spine Complete  07/30/2013   CLINICAL DATA:  Low back pain and left leg pain  EXAM: LUMBAR SPINE - COMPLETE 4+ VIEW  COMPARISON:  None.  FINDINGS: There is no evidence of lumbar spine fracture. Vertebral body heights are normal. There is no malalignment. There are mild degenerative joint changes with anterior osteophytosis of the thoracic lumbar spine.  IMPRESSION: No acute fracture or dislocation. Mild degenerative joint changes of the spine.   Electronically Signed   By: Sherian Rein M.D.   On: 07/30/2013 01:12    Jeoffrey Massed, MD  [facility] Pager:[phone]  If 7PM-7AM, please contact night-coverage [email or web address] Password [email or web address] 07/30/2013, 10:25 AM   LOS: 1 day

## 2013-07-30 NOTE — H&P (Signed)
[facility] History and Physical  Patient: [name] [name]  ZOX:[id number]  DOB: 11/25/1929  DOS: the patient was seen and examined on 07/30/2013 PCP: Ginette Otto, MD  Chief Complaint: Left-sided pain in the leg  HPI: [name] [name] is a 79 y.o. male with Past medical history of aortic stenosis status post aVR, dyslipidemia, CABG, hypertension. The patient is coming from home. The patient presents with complaint of pain that has been originating in his back on the left side and radiating down to the knee. Symptom has been ongoing since last 2 weeks and progressively worsened over last 3-4 days. He mentioned that he does some housework activity and he might have overdone it. The pain is described as a burning and sharp pain it is constantly present. He came to the ED today as he was not even able to walk at home due to severe pain. He denies any trauma, incontinence of bowel or bladder, saddle-shaped anesthesia in thight, fever, chills, urinary retention, diarrhea. He mentions on and off tingling in his left leg with occasional numbness that has been ongoing since last few days. He was unable to walk after receiving pain medications in the ED.  Review of Systems: as mentioned in the history of present illness.  A Comprehensive review of the other systems is negative.  Past Medical History  Diagnosis Date  . Aortic stenosis     s/p AVR in 2004  . Spasmodic torticollis     recent with persistant headaches  . Sinus bradycardia   . Bifascicular block   . Hypercholesterolemia     On Simvastatin  . Coronary artery disease     s/p CABG in 2004  . Right bundle branch block   . Abnormal Holter monitor finding November 2012    Runs of SVT with profound bradycardia   Past Surgical History  Procedure Laterality Date  . Coronary artery bypass graft  10-30-02    x8 per Dr. Laneta Simmers with LIMA to LAD, SVG to PD, SVG to 2nd DX/1st DX, & SVG to subbranch of the 1st DX & 1st OM  . Aortic  valve replacement  10-30-02    insertion of a bioprosthetic #28mm AVR   Social History:  reports that he has quit smoking. He does not have any smokeless tobacco history on file. He reports that he does not drink alcohol or use illicit drugs. Independent for most of his  ADL.  No Known Allergies  Family History  Problem Relation Age of Onset  . Hypertension Mother   . Hypertension Sister     Prior to Admission medications   Medication Sig Start Date End Date Taking? Authorizing Provider  amLODipine (NORVASC) 2.5 MG tablet Take 2.5-5 mg by mouth 2 (two) times daily. Takes 5mg  in morning and 2.5mg  at bedtime   Yes Historical Provider, MD  aspirin EC 325 MG tablet Take 325 mg by mouth every other day.   Yes Historical Provider, MD  bimatoprost (LUMIGAN) 0.03 % ophthalmic solution Place 1 drop into both eyes at bedtime.    Yes Historical Provider, MD  brimonidine (ALPHAGAN P) 0.1 % SOLN Place 1 drop into both eyes every evening.   Yes Historical Provider, MD  furosemide (LASIX) 80 MG tablet Take 80 mg by mouth daily.   Yes Historical Provider, MD  guaiFENesin (MUCINEX) 600 MG 12 hr tablet Take 600 mg by mouth daily as needed for to loosen phlegm.   Yes Historical Provider, MD  loratadine (CLARITIN) 10 MG tablet Take 10  mg by mouth daily as needed for allergies.   Yes Historical Provider, MD  potassium chloride SA (K-DUR,KLOR-CON) 20 MEQ tablet Take 20 mEq by mouth daily.   Yes Historical Provider, MD  rosuvastatin (CRESTOR) 5 MG tablet Take 5 mg by mouth daily.     Yes Historical Provider, MD    Physical Exam: Filed Vitals:   07/30/13 0015 07/30/13 0215 07/30/13 0245 07/30/13 0300  BP: 119/59 115/51 120/63 168/80  Pulse: 66 59 62 67  Temp:    97.9 F (36.6 C)  TempSrc:    Oral  Resp: 15 14 16 20   SpO2: 94% 95% 97% 99%    General: Alert, Awake and Oriented to Time, Place and Person. Appear in mild distress Eyes: PERRL ENT: Oral Mucosa clear moist. Neck: No JVD Cardiovascular: S1  and S2 Present, no Murmur, Peripheral Pulses Present Respiratory: Bilateral Air entry equal and Decreased, Clear to Auscultation,  No Crackles, no wheezes Abdomen: Bowel Sound Present, Soft and Non tender Skin: No Rash Extremities: No Pedal edema, no calf tenderness,  Straight leg raising induces pain on the hip and back on the left at 30, reflexes present bilaterally, Babinski negative, hyperesthesia on the left Neurologic: Otherwise Grossly Unremarkable Labs on Admission:  CBC:  Recent Labs Lab 07/29/13 2224  WBC 9.3  NEUTROABS 8.0*  HGB 12.4*  HCT 35.0*  MCV 91.9  PLT 151    CMP     Component Value Date/Time   NA 129* 07/29/2013 2224   K 4.2 07/29/2013 2224   CL 93* 07/29/2013 2224   CO2 30 07/29/2013 2224   GLUCOSE 190* 07/29/2013 2224   BUN 15 07/29/2013 2224   CREATININE 0.94 07/29/2013 2224   CALCIUM 9.0 07/29/2013 2224   PROT 7.3 03/09/2013 1650   ALBUMIN 4.3 03/09/2013 1650   AST 19 03/09/2013 1650   ALT 12 03/09/2013 1650   ALKPHOS 53 03/09/2013 1650   BILITOT 0.9 03/09/2013 1650   GFRNONAA 76* 07/29/2013 2224   GFRAA 88* 07/29/2013 2224    No results found for this basename: LIPASE, AMYLASE,  in the last 168 hours No results found for this basename: AMMONIA,  in the last 168 hours  No results found for this basename: CKTOTAL, CKMB, CKMBINDEX, TROPONINI,  in the last 168 hours BNP (last 3 results) No results found for this basename: PROBNP,  in the last 8760 hours  Radiological Exams on Admission: Dg Lumbar Spine Complete  07/30/2013   CLINICAL DATA:  Low back pain and left leg pain  EXAM: LUMBAR SPINE - COMPLETE 4+ VIEW  COMPARISON:  None.  FINDINGS: There is no evidence of lumbar spine fracture. Vertebral body heights are normal. There is no malalignment. There are mild degenerative joint changes with anterior osteophytosis of the thoracic lumbar spine.  IMPRESSION: No acute fracture or dislocation. Mild degenerative joint changes of the spine.   Electronically  Signed   By: Sherian Rein M.D.   On: 07/30/2013 01:12    Assessment/Plan Principal Problem:   Left leg pain Active Problems:   S/P aortic valve replacement with bioprosthetic valve   Hx of CABG   Back pain   1. Left leg pain The patient is presenting with left leg pain originating in the back and located on the backside of the thigh. Is initial lumbar x-ray is negative for any acute abnormality. He is unable to walk due to significant pain. He will be admitted for therapy and pain management for observation. IV narcotic, muscle relaxant  will be given as needed. Due to the symptoms MRI of the back will be ordered the lumbar spine evaluation  2. History of CAD and aortic wall replacement Bioprosthetic aortic wall Continue amlodipine and aspirin  Consults: PTOT  DVT Prophylaxis: subcutaneous Heparin Nutrition: Cardiac diet  Code Status: Full  Disposition: Admitted to observation in med-surge unit.  Author: Lynden Oxford, MD [facility] Pager: [phone] 07/30/2013, 5:18 AM    If 7PM-7AM, please contact night-coverage [email or web address] Password [email or web address]

## 2013-07-30 NOTE — ED Notes (Signed)
Niece called and updated on pt's care and room assignment. Niece will visit pt in the morning. Pt informed.

## 2013-07-30 NOTE — Progress Notes (Signed)
NURSING PROGRESS NOTE  [name] [name] [id number] Admission Data: 07/30/2013 0320 Attending Provider: Lynden Oxford, MD WUJ:[id number],NFA Maisie Fus, MD Code Status: Full  [name] [name] is a 79 y.o. male patient admitted from ED:  -No acute distress noted.  -No complaints of shortness of breath.  -No complaints of chest pain.     Blood pressure 120/63, pulse 62, temperature 97.8 F (36.6 C), temperature source Oral, resp. rate 16, SpO2 97.00%.   IV Fluids:  IV in place, occlusive dsg intact without redness, IV cath antecubital right, condition patent and no redness NSL.  Allergies:  Review of patient's allergies indicates no known allergies.  Past Medical History:   has a past medical history of Aortic stenosis; Spasmodic torticollis; Sinus bradycardia; Bifascicular block; Hypercholesterolemia; Coronary artery disease; Right bundle branch block; and Abnormal Holter monitor finding (November 2012).  Past Surgical History:   has past surgical history that includes Coronary artery bypass graft (10-30-02) and Aortic valve replacement (10-30-02).  Social History:   reports that he has quit smoking. He does not have any smokeless tobacco history on file. He reports that he does not drink alcohol or use illicit drugs.  Skin: NSI, scattered bruises on BIL arms, rash on mid abdomen above umbilicus  Patient orientated to room. Information packet given to patient/family. Admission inpatient armband information verified with patient to include name and date of birth and placed on patient arm. Side rails up x 2, fall assessment and education completed with patient. Patient able to verbalize understanding of risk associated with falls and verbalized understanding to call for assistance before getting out of bed. Call light within reach. Patient able to voice and demonstrate understanding of unit orientation instructions.

## 2013-07-31 ENCOUNTER — Other Ambulatory Visit: Payer: Self-pay | Admitting: *Deleted

## 2013-07-31 ENCOUNTER — Telehealth: Payer: Self-pay | Admitting: Cardiology

## 2013-07-31 DIAGNOSIS — I714 Abdominal aortic aneurysm, without rupture: Secondary | ICD-10-CM

## 2013-07-31 DIAGNOSIS — I713 Abdominal aortic aneurysm, ruptured: Secondary | ICD-10-CM

## 2013-07-31 DIAGNOSIS — M79609 Pain in unspecified limb: Secondary | ICD-10-CM

## 2013-07-31 MED ORDER — MELOXICAM 7.5 MG PO TABS: 7.5000 mg | ORAL_TABLET | Freq: Every day | ORAL | Status: DC

## 2013-07-31 MED ORDER — CYCLOBENZAPRINE HCL 10 MG PO TABS: 10.0000 mg | ORAL_TABLET | Freq: Three times a day (TID) | ORAL | Status: DC | PRN

## 2013-07-31 MED ORDER — TRAMADOL HCL 50 MG PO TABS: 50.0000 mg | ORAL_TABLET | Freq: Four times a day (QID) | ORAL | Status: DC | PRN

## 2013-07-31 MED ORDER — ASPIRIN EC 81 MG PO TBEC
81.0000 mg | DELAYED_RELEASE_TABLET | Freq: Every day | ORAL | Status: DC
  Administered 2013-07-31: 81 mg via ORAL
  Filled 2013-07-31 (×3): qty 1

## 2013-07-31 NOTE — Consult Note (Signed)
Patient name: [name] [name] MRN: [id number] DOB: Jul 26, 1930 Sex: male     Reason for referral:  Chief Complaint  Patient presents with  . Leg Pain    HISTORY OF PRESENT ILLNESS: The patient is a very pleasant 79 year old gentleman who presents to the hospital complaining of left leg discomfort. He was admitted for pain control. This caused him to have some difficulty in walking. He had some prior history of low back pain but no radicular symptoms in the past. He did have a past history significant for aortic valve replacement in 2004. Does have a history of a lateral inguinal hernias. No known history of aneurysm. Family history is negative for aneurysmal disease. He denies any recent cardiac difficulties. He is not on anticoagulation therapy.  Past Medical History  Diagnosis Date  . Aortic stenosis     s/p AVR in 2004  . Spasmodic torticollis     recent with persistant headaches  . Sinus bradycardia   . Bifascicular block   . Hypercholesterolemia     On Simvastatin  . Coronary artery disease     s/p CABG in 2004  . Right bundle branch block   . Abnormal Holter monitor finding November 2012    Runs of SVT with profound bradycardia    Past Surgical History  Procedure Laterality Date  . Coronary artery bypass graft  10-30-02    x8 per Dr. Laneta Simmers with LIMA to LAD, SVG to PD, SVG to 2nd DX/1st DX, & SVG to subbranch of the 1st DX & 1st OM  . Aortic valve replacement  10-30-02    insertion of a bioprosthetic #15mm AVR    History   Social History  . Marital Status: Widowed    Spouse Name: N/A    Number of Children: N/A  . Years of Education: N/A   Occupational History  . Not on file.   Social History Main Topics  . Smoking status: Former Games developer  . Smokeless tobacco: Not on file  . Alcohol Use: No  . Drug Use: No  . Sexual Activity: Not on file   Other Topics Concern  . Not on file   Social History Narrative  . No narrative on file    Family History    Problem Relation Age of Onset  . Hypertension Mother   . Hypertension Sister     Allergies as of 07/29/2013  . (No Known Allergies)    No current facility-administered medications on file prior to encounter.   Current Outpatient Prescriptions on File Prior to Encounter  Medication Sig Dispense Refill  . bimatoprost (LUMIGAN) 0.03 % ophthalmic solution Place 1 drop into both eyes at bedtime.       . rosuvastatin (CRESTOR) 5 MG tablet Take 5 mg by mouth daily.           REVIEW OF SYSTEMS:  Reviewed in his history and physical. Negative except for past history    PHYSICAL EXAMINATION:  General: The patient is a well-nourished male, in no acute distress. Vital signs are BP 115/64  Pulse 60  Temp(Src) 97.6 F (36.4 C) (Oral)  Resp 20  Ht 5\' 7"  (1.702 m)  Wt 153 lb 14.1 oz (69.8 kg)  BMI 24.10 kg/m2  SpO2 94% Pulmonary: There is a good air exchange bilaterally  Abdomen: Soft and non-tender with normal pitch bowel sounds. Easily palpable aneurysm which is nontender. Does have large right inguinal hernia which is easily reducible and nontender. Musculoskeletal: There are no  major deformities.  There is no significant extremity pain. Neurologic: No focal weakness or paresthesias are detected, Skin: There are no ulcer or rashes noted. Psychiatric: The patient has normal affect. Cardiovascular: 2+ radial 2+ femoral 2+ popliteal 2+ posterior tibial pulses with no evidence of peripheral artery aneurysm  MRI: This was of his spine but he does give good visualization of his aorta. He does have a 2 cm infrarenal aortic neck and then a 5.5 cm abdominal aortic aneurysm. He is mild ectasia of his iliac vessels.  Impression and Plan:  Asymptomatic 5.5 cm infrarenal abdominal aortic aneurysm. I discussed this at length with the patient. He is recovering from his back pain. The plan is for him to be discharged home this evening. I have recommended a further evaluation and probable  treatment of his 5.5 cm aneurysm. We will arrange outpatient followup with Dr. Patty Sermons his cardiologist or preoperative evaluation. We will obtain a formal CT angiogram as an outpatient and see him in our office as an outpatient. I discussed this at length with the patient who understands.    Chrissy Ealey [facility] Office: [phone]

## 2013-07-31 NOTE — Telephone Encounter (Signed)
Left message for patient to call back  

## 2013-07-31 NOTE — Care Management Note (Addendum)
    Page 1 of 2   07/31/2013     2:40:52 PM   CARE MANAGEMENT NOTE 07/31/2013  Patient:  [name] [name]   Account Number:  [id number]  Date Initiated:  07/31/2013  Documentation initiated by:  Letha Cape  Subjective/Objective Assessment:   dx ?sciatica, leg pain  admit-lives alone     Action/Plan:   pt eval- rec hhpt and rolling walker   Anticipated DC Date:  07/31/2013   Anticipated DC Plan:  HOME W HOME HEALTH SERVICES      DC Planning Services  CM consult      [facility] Choice  DURABLE MEDICAL EQUIPMENT  HOME HEALTH   Choice offered to / List presented to:  C-1 Patient   DME arranged  Levan Hurst      DME agency  [facility]     HH arranged  HH-2 PT  HH-4 NURSE'S AIDE  HH-6 SOCIAL WORKER      HH agency  [facility]   Status of service:  Completed, signed off Medicare Important Message given?   (If response is "NO", the following Medicare IM given date fields will be blank) Date Medicare IM given:   Date Additional Medicare IM given:    Discharge Disposition:  HOME W HOME HEALTH SERVICES  Per UR Regulation:  Reviewed for med. necessity/level of care/duration of stay  If discussed at Long Length of Stay Meetings, dates discussed:    Comments:  07/29/13 14:35 Letha Cape RN, BSN [phone] patient lives alone, per physical therapy recs hhpt, patient chose [facility] from agnecy list, referral made to [facility], Hilda Lias notifired for [facility], aide and CSW.  Rolling walker order for patient and will be brought up to his room. Patient is not a [facility] candidate but will be candidate for [facility] Community Case Mgt to follow, made referral to [facility]. Patient states he does not need a bsc.

## 2013-07-31 NOTE — Discharge Summary (Signed)
Physician Discharge Summary  [name] [name] ZOX:[id number] DOB: [birth year] DOA: 07/29/2013  PCP: Ginette Otto, MD  Admit date: 07/29/2013 Discharge date: 07/31/2013  Time spent: 50 minutes  Recommendations for Outpatient Follow-up:   See Dr. Patty Sermons for cardiac clearance for treatment of his AAA  Follow up with Dr. Arbie Cookey, vascular surgery as directed for AAA.  PCP follow up for Sciatica.  Home health physical therapy, aide and social work were requested for the patient at discharge.  Discharge Diagnoses:  Principal Problem:   Left leg pain Active Problems:   S/P aortic valve replacement with bioprosthetic valve   Hx of CABG   Back pain   Discharge Condition: stable, pain improved  Diet recommendation: diabetic diet.  Filed Weights   07/30/13 0300  Weight: 69.8 kg (153 lb 14.1 oz)    History of present illness at the time of admission:  [name] [name] is a 79 y.o. male with a past medical history of aortic stenosis status post aVR, dyslipidemia, CABG, hypertension. He presented with complaints of pain that  originated in his back on the left side and radiated down to the knee. Symptoms have been ongoing for 2 weeks and progressively worsened over last 3-4 days. He mentioned that he does some housework activity and he might have overdone it. The pain is described as a burning and sharp pain it is constantly present. He came to the ED today as he was not even able to walk at home due to severe pain.  He denies any trauma, incontinence of bowel or bladder, saddle-shaped anesthesia in thight, fever, chills, urinary retention, diarrhea.  He mentions on and off tingling in his left leg with occasional numbness that has been ongoing since last few days. He was unable to walk after receiving pain medications in the ED.  Hospital Course:   Left Leg Pain (Sciatica) MRI of the lumbar spine (results below) showed nothing acute but rather multiple areas of mild disc bulging and  degenerative changes. The back pain was treated with prednisone, NSAIDS and flexeril and improved overnight.He was able to ambulate, and raise his leg with these measures Physical therapy evaluated him on 11/24 and home PT was ordered. He will be discharged to home this evening with Rx for flexeril, mobic and ultram prn. Follow up with PCP in 1 week.  AAA 5.4 cm Incidental finding on MRI Dr. Tawanna Cooler Early of Vascular surgery consulted and will follow [name] [name] outpatient. The patient will see Dr. Patty Sermons for cardiac clearance for probable treatment of his AAA. Dr. Arbie Cookey will arrange for CT angiogram as an outpatient.  CAD, Aortic Stenosis s/p AVR Remained quiet and stable during this admission Continue home medications.  The patient's discharge was discussed with his niece [name] [name].  Dr. Jerral Ralph explained that the patient is a fall risk and should be checked on several times a day.  We have requested home health services as well as possible [facility] services.  Ms. Joanne Gavel understands and agrees that the family will do what they can to monitor the patient.  Consultations:  Vascular Surgery  Discharge Exam: Filed Vitals:   07/31/13 1426  BP: 115/64  Pulse: 60  Temp: 97.6 F (36.4 C)  Resp: 20   General:  Awake, Alert, Pleasant, lying comfortably in bed CV:  RRR, slightly brady,  with 3/6 systolic murmur Resp:  CTA no w/c/r Abdomen:  Soft, nt, nd, +bs, no masses, no bruits. Extremities:  5/5 strength in each, able to ambulate about the room without  assistance   Discharge Instructions       Future Appointments Provider Department Dept Phone   08/17/2013 8:30 AM [facility] [place] Office [phone]   08/22/2013 10:45 AM Cassell Clement, MD [facility] [place] Office [phone]       Medication List         amLODipine 2.5 MG tablet  Commonly known as:  NORVASC  Take 2.5-5 mg by mouth 2 (two) times daily. Takes 5mg  in morning and 2.5mg  at  bedtime     aspirin EC 325 MG tablet  Take 325 mg by mouth every other day.     bimatoprost 0.03 % ophthalmic solution  Commonly known as:  LUMIGAN  Place 1 drop into both eyes at bedtime.     brimonidine 0.1 % Soln  Commonly known as:  ALPHAGAN P  Place 1 drop into both eyes every evening.     cyclobenzaprine 10 MG tablet  Commonly known as:  FLEXERIL  Take 1 tablet (10 mg total) by mouth 3 (three) times daily as needed for muscle spasms.     furosemide 80 MG tablet  Commonly known as:  LASIX  Take 80 mg by mouth daily.     guaiFENesin 600 MG 12 hr tablet  Commonly known as:  MUCINEX  Take 600 mg by mouth daily as needed for to loosen phlegm.     loratadine 10 MG tablet  Commonly known as:  CLARITIN  Take 10 mg by mouth daily as needed for allergies.     meloxicam 7.5 MG tablet  Commonly known as:  MOBIC  Take 1 tablet (7.5 mg total) by mouth daily.     potassium chloride SA 20 MEQ tablet  Commonly known as:  K-DUR,KLOR-CON  Take 20 mEq by mouth daily.     rosuvastatin 5 MG tablet  Commonly known as:  CRESTOR  Take 5 mg by mouth daily.     traMADol 50 MG tablet  Commonly known as:  ULTRAM  Take 1 tablet (50 mg total) by mouth every 6 (six) hours as needed for severe pain.       No Known Allergies Follow-up Information   Follow up with Ginette Otto, MD. Schedule an appointment as soon as possible for a visit in 1 week.   Specialty:  Internal Medicine   Contact information:   [address] [place] [address] [phone]       Follow up with EARLY, TODD, MD. (as directed.)    Specialty:  Vascular Surgery   Contact information:   [address] [place] [address] [phone]       Follow up with Cassell Clement, MD. Schedule an appointment as soon as possible for a visit in 1 week. (Pre-op evaluation for vascular surgery)    Specialty:  Cardiology   Contact information:   [address] [place]  [address] [phone]        The results of significant diagnostics from this hospitalization (including imaging, microbiology, ancillary and laboratory) are listed below for reference.    Significant Diagnostic Studies: Dg Lumbar Spine Complete  07/30/2013   CLINICAL DATA:  Low back pain and left leg pain  EXAM: LUMBAR SPINE - COMPLETE 4+ VIEW  COMPARISON:  None.  FINDINGS: There is no evidence of lumbar spine fracture. Vertebral body heights are normal. There is no malalignment. There are mild degenerative joint changes with anterior osteophytosis of the thoracic lumbar spine.  IMPRESSION:  No acute fracture or dislocation. Mild degenerative joint changes of the spine.   Electronically Signed   By: Sherian Rein M.D.   On: 07/30/2013 01:12   Mr Lumbar Spine Wo Contrast  07/30/2013   CLINICAL DATA:  Low back and left leg pain. No known injury or prior relevant surgery. No history of malignancy.  EXAM: MRI LUMBAR SPINE WITHOUT CONTRAST  TECHNIQUE: Multiplanar, multisequence MR imaging was performed. No intravenous contrast was administered.  COMPARISON:  Lumbar spine radiographs same date.  FINDINGS: Radiographs demonstrate 5 lumbar type vertebral bodies. There are bilateral L5 pars defects associated with 2 mm of anterolisthesis at L5-S1. The alignment is otherwise normal. There is no evidence of acute fracture. Scattered mild endplate degenerative changes are noted. There is a small hemangioma at L3.  The conus medullaris extends to the L1-2 level and appears normal. No paraspinal abnormalities are identified. However, there is a large infrarenal abdominal aortic aneurysm with associated mural thrombus. This measures 5.4 x 5.3 cm transverse on image 27. No retroperitoneal hematoma is identified.  L1-2: Minimal disc bulging and anterior osteophytes. No spinal stenosis or nerve root encroachment.  L2-3: Mild disc bulging with mild bilateral facet hypertrophy. No spinal stenosis or nerve root  encroachment.  L3-4: Mild disc bulging with mild facet and ligamentous hypertrophy. No spinal stenosis or nerve root encroachment.  L4-5: Mild disc bulging with mild facet and ligamentous hypertrophy. No spinal stenosis or nerve root encroachment.  L5-S1: As above, there are bilateral L5 pars defects with associated mild marrow edema, worse on the right. There is minimal disc bulging. With the patient supine, there is no significant foraminal compromise or exiting L5 nerve root encroachment. The central canal and lateral recesses are patent.  IMPRESSION: 1. Bilateral L5 pars defects. There is only mild associated disc bulging and a minimal anterolisthesis. No L5-S1 foraminal compromise or L5 nerve root encroachment is seen with the patient supine. 2. Mild disc bulging and facet disease elsewhere throughout the lumbar spine. 3. No acute osseous findings. 4. Large abdominal aortic aneurysm measuring up to 5.4 cm in diameter. Recommend follow up by abdomen and pelvis CTA in 3-6 months, and consider vascular surgery referral/consultation if not already obtained. This recommendation follows ACR consensus guidelines: White Paper of the ACR Incidental Findings Committee II on Vascular Findings. J Am Coll Radiol 2013; 10:789-794.   Electronically Signed   By: Roxy Horseman M.D.   On: 07/30/2013 18:28      Labs: Basic Metabolic Panel:  Recent Labs Lab 07/29/13 2224 07/30/13 0855  NA 129* 134*  K 4.2 3.9  CL 93* 96  CO2 30 28  GLUCOSE 190* 214*  BUN 15 14  CREATININE 0.94 0.78  CALCIUM 9.0 8.8   Liver Function Tests:  Recent Labs Lab 07/30/13 0855  AST 13  ALT 9  ALKPHOS 62  BILITOT 0.9  PROT 6.4  ALBUMIN 3.4*   CBC:  Recent Labs Lab 07/29/13 2224 07/30/13 0855  WBC 9.3 8.2  NEUTROABS 8.0*  --   HGB 12.4* 12.5*  HCT 35.0* 35.5*  MCV 91.9 91.3  PLT 151 157    Signed:  Conley Canal [phone]  [facility] 07/31/2013, 3:15 PM   Attending Patient seen and  examined, agree with the assessment and plan as outlined above. He is significantly better, ambulating almost independently. He can raise his left leg much more compared to just yesterday. MRI LS Spine des not show anything acute, stable for discharge. VVS will  f/u in the outpatient setting regarding his incidental AAA of 5.4 cm I have spoke with patient's niece, who would like to take patient home with HHPT and see if he can continue to do function independently, before considering  SNF. Family will keep a close eye on the patient, have arranged HHPT  Windell Norfolk MD

## 2013-07-31 NOTE — Evaluation (Signed)
Physical Therapy Evaluation Patient Details Name: [name] [name] MRN: [id number] DOB: [birth year] Today's Date: 07/31/2013 Time: 4098-1191 PT Time Calculation (min): 21 min  PT Assessment / Plan / Recommendation History of Present Illness  79 y.o. male admitted with severe left LE raditating pain and inability to walk.  Clinical Impression  Patient presents with mild pain left LE and mild instability with gait.  Feel he can go home with HHPT and intermittent help from family (nephew.)  Would benefit from skilled PT in the acute setting to maximize independence and safety and allow safe return home with HHPT.    PT Assessment  Patient needs continued PT services    Follow Up Recommendations  Home health PT;Supervision - Intermittent    Does the patient have the potential to tolerate intense rehabilitation    N/A  Barriers to Discharge Decreased caregiver support      Equipment Recommendations  Rolling walker with 5" wheels    Recommendations for Other Services   None  Frequency Min 3X/week    Precautions / Restrictions Precautions Precautions: Fall Restrictions Weight Bearing Restrictions: No   Pertinent Vitals/Pain 1/10 left post thigh to behind knee      Mobility  Bed Mobility Bed Mobility: Sit to Supine;Scooting to HOB Sit to Supine: 5: Supervision;HOB elevated Scooting to HOB: 5: Supervision Transfers Transfers: Sit to Stand;Stand to Sit Sit to Stand: From chair/3-in-1;5: Supervision Stand to Sit: To chair/3-in-1;5: Supervision Details for Transfer Assistance: appropriately reaches for and pushes up from seating surface on chair without armrests Ambulation/Gait Ambulation/Gait Assistance: 5: Supervision;4: Min guard Ambulation Distance (Feet): 200 Feet Assistive device: None Ambulation/Gait Assistance Details: occasional veering to right with minguard assist for safety, in stance on left rotates pelvis posteriorly to avoid stretching hamstrings Gait Pattern:  Step-through pattern;Decreased stride length;Trunk flexed;Antalgic Stairs: Yes Stairs Assistance: 5: Supervision Stairs Assistance Details (indicate cue type and reason): cues for sequence with pt self selected up with good down with bad Stair Management Technique: One rail Right;Step to pattern;Forwards Number of Stairs: 4    Exercises Other Exercises Other Exercises: Pt educated in and performed seated hamstring stretch 3 x 10 second hold also educated in gluteal stretch   PT Diagnosis: Acute pain;Abnormality of gait  PT Problem List: Decreased balance;Decreased mobility;Pain;Decreased knowledge of use of DME PT Treatment Interventions: DME instruction;Balance training;Gait training;Stair training;Functional mobility training;Patient/family education;Therapeutic activities;Therapeutic exercise     PT Goals(Current goals can be found in the care plan section) Acute Rehab PT Goals Patient Stated Goal: To return to independent PT Goal Formulation: With patient Time For Goal Achievement: 08/07/13 Potential to Achieve Goals: Good  Visit Information  Last PT Received On: 07/31/13 Assistance Needed: +1 History of Present Illness: 79 y.o. male admitted with severe left LE raditating pain and inability to walk.       Prior Functioning  Home Living Family/patient expects to be discharged to:: Private residence Living Arrangements: Alone Available Help at Discharge: Family Type of Home: House Home Access: Stairs to enter [employer] of Steps: 4 in back Entrance Stairs-Rails: Right;Left;Can reach both Home Layout: One level Home Equipment: None Prior Function Level of Independence: Independent Communication Communication: No difficulties Dominant Hand: Right    Cognition  Cognition Arousal/Alertness: Awake/alert Behavior During Therapy: WFL for tasks assessed/performed Overall Cognitive Status: Within Functional Limits for tasks assessed    Extremity/Trunk  Assessment Lower Extremity Assessment Lower Extremity Assessment: Overall WFL for tasks assessed Cervical / Trunk Assessment Cervical / Trunk Assessment: Kyphotic   Balance Balance  Balance Assessed: Yes Dynamic Standing Balance Dynamic Standing - Balance Support: No upper extremity supported Dynamic Standing - Level of Assistance: 5: Stand by assistance Dynamic Standing - Comments: standing washing hands in sink; demonstrated single limb stance as well with one UE assist on bed rail and minguard for safety while fixing sock  End of Session PT - End of Session Equipment Utilized During Treatment: Gait belt Activity Tolerance: Patient tolerated treatment well Patient left: in bed;with call bell/phone within reach  GP Functional Assessment Tool Used: Clinical Judgement Functional Limitation: Mobility: Walking and moving around Mobility: Walking and Moving Around Current Status ([id number]): At least 1 percent but less than 20 percent impaired, limited or restricted Mobility: Walking and Moving Around Goal Status [id number]): At least 1 percent but less than 20 percent impaired, limited or restricted   [facility] 07/31/2013, 10:18 AM Sheran Lawless, PT [phone] 07/31/2013

## 2013-07-31 NOTE — Progress Notes (Signed)
Pt woke up this morning and wanted to get into chair. Was assisted into chair by staff with significant improvement in mobility from yesterday. When asked about his pain he stated that "the pain is much better". Rated pain as a 3/10. Will continue to monitor.

## 2013-07-31 NOTE — Progress Notes (Signed)
Pt's niece was contacted at 12:05 regarding pt's discharge today. No answer on home phone. Voicemail was left. Awaiting callback.

## 2013-07-31 NOTE — Discharge Summary (Signed)
[name] [name] to be D/C'd Home per MD order. Discussed with the patient and all questions fully answered.    Medication List         amLODipine 2.5 MG tablet  Commonly known as:  NORVASC  Take 2.5-5 mg by mouth 2 (two) times daily. Takes 5mg  in morning and 2.5mg  at bedtime     aspirin EC 325 MG tablet  Take 325 mg by mouth every other day.     bimatoprost 0.03 % ophthalmic solution  Commonly known as:  LUMIGAN  Place 1 drop into both eyes at bedtime.     brimonidine 0.1 % Soln  Commonly known as:  ALPHAGAN P  Place 1 drop into both eyes every evening.     cyclobenzaprine 10 MG tablet  Commonly known as:  FLEXERIL  Take 1 tablet (10 mg total) by mouth 3 (three) times daily as needed for muscle spasms.     furosemide 80 MG tablet  Commonly known as:  LASIX  Take 80 mg by mouth daily.     guaiFENesin 600 MG 12 hr tablet  Commonly known as:  MUCINEX  Take 600 mg by mouth daily as needed for to loosen phlegm.     loratadine 10 MG tablet  Commonly known as:  CLARITIN  Take 10 mg by mouth daily as needed for allergies.     meloxicam 7.5 MG tablet  Commonly known as:  MOBIC  Take 1 tablet (7.5 mg total) by mouth daily.     potassium chloride SA 20 MEQ tablet  Commonly known as:  K-DUR,KLOR-CON  Take 20 mEq by mouth daily.     rosuvastatin 5 MG tablet  Commonly known as:  CRESTOR  Take 5 mg by mouth daily.     traMADol 50 MG tablet  Commonly known as:  ULTRAM  Take 1 tablet (50 mg total) by mouth every 6 (six) hours as needed for severe pain.        VVS, Skin clean, dry and intact without evidence of skin break down, no evidence of skin tears noted.  IV catheter discontinued intact. Site without signs and symptoms of complications. Dressing and pressure applied.  An After Visit Summary was printed and given to the patient.  Patient escorted via WC, and D/C home via private auto.  Beckey Downing F  07/31/2013 6:29 PM

## 2013-07-31 NOTE — Telephone Encounter (Signed)
New messages  The patient needs medical clearance for surgery ( aorta abdominal) please call and advise.

## 2013-08-02 ENCOUNTER — Telehealth: Payer: Self-pay | Admitting: Vascular Surgery

## 2013-08-02 ENCOUNTER — Encounter: Payer: Self-pay | Admitting: Cardiology

## 2013-08-02 ENCOUNTER — Ambulatory Visit (INDEPENDENT_AMBULATORY_CARE_PROVIDER_SITE_OTHER): Payer: Medicare Other | Admitting: Cardiology

## 2013-08-02 VITALS — BP 153/66 | HR 65 | Ht 67.0 in | Wt 157.6 lb

## 2013-08-02 DIAGNOSIS — I714 Abdominal aortic aneurysm, without rupture, unspecified: Secondary | ICD-10-CM

## 2013-08-02 DIAGNOSIS — Z953 Presence of xenogenic heart valve: Secondary | ICD-10-CM

## 2013-08-02 DIAGNOSIS — I452 Bifascicular block: Secondary | ICD-10-CM

## 2013-08-02 DIAGNOSIS — Z951 Presence of aortocoronary bypass graft: Secondary | ICD-10-CM

## 2013-08-02 DIAGNOSIS — I119 Hypertensive heart disease without heart failure: Secondary | ICD-10-CM

## 2013-08-02 DIAGNOSIS — I259 Chronic ischemic heart disease, unspecified: Secondary | ICD-10-CM

## 2013-08-02 DIAGNOSIS — Z952 Presence of prosthetic heart valve: Secondary | ICD-10-CM

## 2013-08-02 NOTE — Assessment & Plan Note (Signed)
The patient has not been experiencing any chest pain or angina. 

## 2013-08-02 NOTE — Assessment & Plan Note (Signed)
The patient has not been experiencing any symptoms of congestive heart failure.  No palpitations or cardiac arrhythmia noted

## 2013-08-02 NOTE — Patient Instructions (Signed)
Your physician has requested that you have an echocardiogram. Echocardiography is a painless test that uses sound waves to create images of your heart. It provides your doctor with information about the size and shape of your heart and how well your heart's chambers and valves are working. This procedure takes approximately one hour. There are no restrictions for this procedure.  Your physician has requested that you have a lexiscan myoview. For further information please visit [email or web address] Please follow instruction sheet, as given.  SCHEDULE AS SOON AS POSSIBLE TO CLEAR YOU FOR YOUR AAA SURGERY.  Your physician recommends that you continue on your current medications as directed. Please refer to the Current Medication list given to you today.  Your physician recommends that you schedule a follow-up appointment in: 4 months with Dr Patty Sermons.

## 2013-08-02 NOTE — Assessment & Plan Note (Signed)
The patient has had some recent low back pain.  No definite symptoms from the abdominal aortic aneurysm.  He was told that he had a pinched nerve causing sciatica.

## 2013-08-02 NOTE — Telephone Encounter (Addendum)
Message copied by Fredrich Birks on Wed Aug 02, 2013 10:16 AM ------      Message from: Melene Plan      Created: Mon Jul 31, 2013  2:53 PM       DR TFE SAID 2-3 WEEKS      ----- Message -----         From: Larina Earthly, MD         Sent: 07/31/2013   2:36 PM           To: Reuel Derby, Melene Plan, RN            Level IV new patient consult for abdominal aortic aneurysm. Needs cardiac clearance with Dr. Patty Sermons and CT angiogram abdomen and pelvis for stent graft evaluation and followup with me in the office.       ------  Dr Patty Sermons will see Mr Mcartor on 08/02/2013 @ 345pm Dr Pete Glatter will see Mr Folks on 08/10/2013 @ 245pm CTA on 08/15/2013 at 1:00 and Dr Early at 2:15.  I spoke with Mr Mangrum regarding these appointments. I also mailed the CT information to him. He is a bit confused with everything that is going on, however he did repeat all appt information back to me before we hung up. I stressed the importance of calling me if he had any questions. He has my name and direct number should anything come up, dpm

## 2013-08-02 NOTE — Telephone Encounter (Signed)
Scheduled for ov today ?

## 2013-08-02 NOTE — Progress Notes (Signed)
[name] [name] Date of Birth:  23-Feb-1930 [address] [facility] [address] [place], [place]  [address] [phone]         Fax   [phone]  History of Present Illness: This pleasant 79 year old gentleman is seen for a work in office visit.  He was recently seen in [facility] emergency room for back pain.  An MRI revealed a previously unknown large abdominal aortic aneurysm measuring 5.4 x 5.3 cm.  He comes in now for preoperative cardiac risk assessment. He has a history of previous aortic stenosis. In 2004 he underwent surgery for aortic valve stenosis with insertion of a bioprosthetic valve. At the same time he underwent coronary artery bypass graft surgery x5 .The patient has done well postoperatively. The patient has a history of marked chronic intrinsic sinus bradycardia with bifascicular block. He has not had complete heart block. He is not having any symptoms from his bradycardia. His last visit he has continued to do well. He denies any chest pain. He's had no dizziness or syncope despite his chronic bradycardia. He remains physically active.  His last echocardiogram on 04/11/10 showed an ejection fraction of 55-60% and an impaired relaxation.  The bioprosthetic aortic valve was functioning normally at that time. His last EKG done in the emergency room on 07/29/13 showed normal sinus rhythm with bifascicular block.  Current Outpatient Prescriptions  Medication Sig Dispense Refill  . amLODipine (NORVASC) 2.5 MG tablet Take 2.5-5 mg by mouth 2 (two) times daily. Takes 5mg  in morning and 2.5mg  at bedtime      . aspirin EC 325 MG tablet Take 325 mg by mouth every other day.      . bimatoprost (LUMIGAN) 0.03 % ophthalmic solution Place 1 drop into both eyes at bedtime.       . brimonidine (ALPHAGAN P) 0.1 % SOLN Place 1 drop into both eyes every evening.      . cyclobenzaprine (FLEXERIL) 10 MG tablet Take 1 tablet (10 mg total) by mouth 3 (three) times daily as needed for muscle  spasms.  30 tablet  0  . furosemide (LASIX) 80 MG tablet Take 80 mg by mouth daily.      Marland Kitchen guaiFENesin (MUCINEX) 600 MG 12 hr tablet Take 600 mg by mouth daily as needed for to loosen phlegm.      . loratadine (CLARITIN) 10 MG tablet Take 10 mg by mouth daily as needed for allergies.      . meloxicam (MOBIC) 7.5 MG tablet Take 1 tablet (7.5 mg total) by mouth daily.  30 tablet  0  . potassium chloride SA (K-DUR,KLOR-CON) 20 MEQ tablet Take 20 mEq by mouth daily.      . rosuvastatin (CRESTOR) 5 MG tablet Take 5 mg by mouth daily.        . traMADol (ULTRAM) 50 MG tablet Take 1 tablet (50 mg total) by mouth every 6 (six) hours as needed for severe pain.  30 tablet  0   No current facility-administered medications for this visit.    No Known Allergies  Patient Active Problem List   Diagnosis Date Noted  . Left leg pain 07/30/2013  . Back pain 07/30/2013  . S/P aortic valve replacement with bioprosthetic valve 09/11/2011  . Hx of CABG 09/11/2011  . Dizziness 08/05/2011  . Sinus bradycardia 05/06/2011  . Ischemic heart disease 05/06/2011  . Benign hypertensive heart disease without heart failure 05/06/2011    History  Smoking status  . Former Smoker  Smokeless tobacco  .  Not on file    History  Alcohol Use No    Family History  Problem Relation Age of Onset  . Hypertension Mother   . Hypertension Sister     Review of Systems: Constitutional: no fever chills diaphoresis or fatigue or change in weight.  Head and neck: no hearing loss, no epistaxis, no photophobia or visual disturbance. Respiratory: No cough, shortness of breath or wheezing. Cardiovascular: No chest pain peripheral edema, palpitations. Gastrointestinal: No abdominal distention, no abdominal pain, no change in bowel habits hematochezia or melena. Genitourinary: No dysuria, no frequency, no urgency, no nocturia. Musculoskeletal:No arthralgias, no back pain, no gait disturbance or myalgias. Neurological: No  dizziness, no headaches, no numbness, no seizures, no syncope, no weakness, no tremors. Hematologic: No lymphadenopathy, no easy bruising. Psychiatric: No confusion, no hallucinations, no sleep disturbance.    Physical Exam: Filed Vitals:   08/02/13 1548  BP: 153/66  Pulse: 65   the general appearance reveals a well-developed well-nourished elderly gentleman in no distress.The head and neck exam reveals pupils equal and reactive.  Extraocular movements are full.  There is no scleral icterus.  The mouth and pharynx are normal.  The neck is supple.  The carotids reveal no bruits.  The jugular venous pressure is normal.  The  thyroid is not enlarged.  There is no lymphadenopathy.  The chest is clear to percussion and auscultation.  There are no rales or rhonchi.  Expansion of the chest is symmetrical.  The precordium is quiet.  The first heart sound is normal.  The second heart sound is physiologically split.  There is a soft systolic ejection murmur across the prosthetic aortic valve.  There is no diastolic murmur..  There is no abnormal lift or heave.  The abdomen is soft and nontender.  The bowel sounds are normal.  The liver and spleen are not enlarged.  There are no abdominal masses.  There are no abdominal bruits.  Extremities reveal good pedal pulses.  There is no phlebitis or edema.  There is no cyanosis or clubbing.  Strength is normal and symmetrical in all extremities.  There is no lateralizing weakness.  There are no sensory deficits.  The skin is warm and dry.  There is no rash.     Assessment / Plan: The patient will need aortic aneurysm surgery.  He has seen Dr. Arbie Cookey.  We will schedule a two-dimensional echocardiogram to evaluate his prosthetic aortic valve and we will also schedule a Lexa scan Myoview to evaluate his ischemic heart disease.  It has been 10 years since he had his CABG x5 and his aortic valve replacement. He will continue his current medication.

## 2013-08-07 ENCOUNTER — Ambulatory Visit (HOSPITAL_COMMUNITY): Payer: Medicare Other

## 2013-08-07 ENCOUNTER — Encounter: Payer: Self-pay | Admitting: Internal Medicine

## 2013-08-08 ENCOUNTER — Ambulatory Visit (HOSPITAL_COMMUNITY): Payer: Medicare Other | Attending: Cardiology | Admitting: Radiology

## 2013-08-08 ENCOUNTER — Encounter: Payer: Self-pay | Admitting: Cardiology

## 2013-08-08 VITALS — BP 181/80 | HR 47 | Ht 67.0 in | Wt 160.0 lb

## 2013-08-08 DIAGNOSIS — R42 Dizziness and giddiness: Secondary | ICD-10-CM | POA: Insufficient documentation

## 2013-08-08 DIAGNOSIS — I714 Abdominal aortic aneurysm, without rupture, unspecified: Secondary | ICD-10-CM | POA: Insufficient documentation

## 2013-08-08 DIAGNOSIS — Z01810 Encounter for preprocedural cardiovascular examination: Secondary | ICD-10-CM

## 2013-08-08 DIAGNOSIS — E785 Hyperlipidemia, unspecified: Secondary | ICD-10-CM | POA: Insufficient documentation

## 2013-08-08 DIAGNOSIS — I10 Essential (primary) hypertension: Secondary | ICD-10-CM | POA: Insufficient documentation

## 2013-08-08 DIAGNOSIS — I2510 Atherosclerotic heart disease of native coronary artery without angina pectoris: Secondary | ICD-10-CM | POA: Insufficient documentation

## 2013-08-08 DIAGNOSIS — Z87891 Personal history of nicotine dependence: Secondary | ICD-10-CM | POA: Insufficient documentation

## 2013-08-08 DIAGNOSIS — Z951 Presence of aortocoronary bypass graft: Secondary | ICD-10-CM | POA: Insufficient documentation

## 2013-08-08 DIAGNOSIS — Z8249 Family history of ischemic heart disease and other diseases of the circulatory system: Secondary | ICD-10-CM | POA: Insufficient documentation

## 2013-08-08 MED ORDER — TECHNETIUM TC 99M SESTAMIBI GENERIC - CARDIOLITE
10.0000 | Freq: Once | INTRAVENOUS | Status: AC | PRN
  Administered 2013-08-08: 10 via INTRAVENOUS

## 2013-08-08 MED ORDER — REGADENOSON 0.4 MG/5ML IV SOLN
0.4000 mg | Freq: Once | INTRAVENOUS | Status: AC
  Administered 2013-08-08: 0.4 mg via INTRAVENOUS

## 2013-08-08 MED ORDER — TECHNETIUM TC 99M SESTAMIBI GENERIC - CARDIOLITE
30.0000 | Freq: Once | INTRAVENOUS | Status: AC | PRN
  Administered 2013-08-08: 30 via INTRAVENOUS

## 2013-08-08 NOTE — Progress Notes (Signed)
[facility] SITE 3 NUCLEAR MED [address] [place], [place] [address] [phone]    Cardiology Nuclear Med Study  [name] [name] is a 79 y.o. male     MRN : [id number]     DOB: 03-Feb-1930  Procedure Date: 08/08/2013  Nuclear Med Background Indication for Stress Test:  Evaluation for Ischemia and Surgical Clearance for AAA History:  CAD, CABGx5 2004, Echo EF 55-60%, MPI > CABG, AVR 2004, AAA Cardiac Risk Factors: Family History - CAD, History of Smoking, Hypertension and Lipids  Symptoms:  Dizziness   Nuclear Pre-Procedure Caffeine/Decaff Intake:  None NPO After: 7:00pm   Lungs:  clear O2 Sat: 95% on room air. IV 0.9% NS with Angio Cath:  22g  IV Site: R Hand  IV Started by:  Cathlyn Parsons, RN  Chest Size (in):  38 Cup Size: n/a  Height: 5\' 7"  (1.702 m)  Weight:  160 lb (72.576 kg)  BMI:  Body mass index is 25.05 kg/(m^2). Tech Comments:  n/a    Nuclear Med Study 1 or 2 day study: 1 day  Stress Test Type:  Lexiscan  Reading MD: Olga Millers, MD  Order Authorizing Provider:  Villa Herb  Resting Radionuclide: Technetium 87m Sestamibi  Resting Radionuclide Dose: 11.0 mCi   Stress Radionuclide:  Technetium 3m Sestamibi  Stress Radionuclide Dose: 33.0 mCi           Stress Protocol Rest HR: 47 Stress HR: 75  Rest BP: 181/80 Stress BP: 142/67  Exercise Time (min): n/a METS: n/a   Predicted Max HR: 139 bpm % Max HR: 53.96 bpm Rate Pressure Product: 21308   Dose of Adenosine (mg):  n/a Dose of Lexiscan: 0.4 mg  Dose of Atropine (mg): n/a Dose of Dobutamine: n/a mcg/kg/min (at max HR)  Stress Test Technologist: Nelson Chimes, BS-ES  Nuclear Technologist:  Domenic Polite, CNMT     Rest Procedure:  Myocardial perfusion imaging was performed at rest 45 minutes following the intravenous administration of Technetium 56m Sestamibi. Rest ECG: Sinus bradycardia, RBBB, LAFB, first degree AV block, anterior MI.  Stress Procedure:  The patient received  IV Lexiscan 0.4 mg over 15-seconds.  Technetium 29m Sestamibi injected at 30-seconds.  Quantitative spect images were obtained after a 45 minute delay. Stress ECG: No significant ST segment change suggestive of ischemia.  QPS Raw Data Images:  Acquisition technically good; LVE. Stress Images:  There is decreased uptake in the inferior wall. Rest Images:  There is decreased uptake in the inferior wall, less prominent compared to the stress images. Subtraction (SDS):  These findings are consistent with prior inferior infarct and very mild peri-infarct ischemia. Transient Ischemic Dilatation (Normal <1.22):  1.08 Lung/Heart Ratio (Normal <0.45):  0.29  Quantitative Gated Spect Images QGS EDV:  112 ml QGS ESV:  47 ml  Impression Exercise Capacity:  Lexiscan with no exercise. BP Response:  Normal blood pressure response. Clinical Symptoms:  No chest pain or dyspnea. ECG Impression:  No significant ST segment change suggestive of ischemia. Comparison with Prior Nuclear Study: No images to compare  Overall Impression:  Low risk stress nuclear study with a moderate size, moderate intensity, partially reversible inferior defect consistent with prior inferior infarct and very mild peri-infarct ischemia..  LV Ejection Fraction: 58%.  LV Wall Motion:  Inferior hypokinesis.   Olga Millers

## 2013-08-09 ENCOUNTER — Telehealth: Payer: Self-pay | Admitting: *Deleted

## 2013-08-09 ENCOUNTER — Ambulatory Visit (HOSPITAL_COMMUNITY): Payer: Medicare Other | Attending: Cardiology | Admitting: Radiology

## 2013-08-09 ENCOUNTER — Encounter: Payer: Self-pay | Admitting: Cardiology

## 2013-08-09 ENCOUNTER — Other Ambulatory Visit: Payer: Self-pay

## 2013-08-09 DIAGNOSIS — I714 Abdominal aortic aneurysm, without rupture, unspecified: Secondary | ICD-10-CM | POA: Insufficient documentation

## 2013-08-09 DIAGNOSIS — Z953 Presence of xenogenic heart valve: Secondary | ICD-10-CM

## 2013-08-09 DIAGNOSIS — Z952 Presence of prosthetic heart valve: Secondary | ICD-10-CM | POA: Insufficient documentation

## 2013-08-09 DIAGNOSIS — R42 Dizziness and giddiness: Secondary | ICD-10-CM | POA: Insufficient documentation

## 2013-08-09 DIAGNOSIS — Z951 Presence of aortocoronary bypass graft: Secondary | ICD-10-CM

## 2013-08-09 DIAGNOSIS — I259 Chronic ischemic heart disease, unspecified: Secondary | ICD-10-CM | POA: Insufficient documentation

## 2013-08-09 DIAGNOSIS — I498 Other specified cardiac arrhythmias: Secondary | ICD-10-CM | POA: Insufficient documentation

## 2013-08-09 DIAGNOSIS — I119 Hypertensive heart disease without heart failure: Secondary | ICD-10-CM

## 2013-08-09 DIAGNOSIS — I059 Rheumatic mitral valve disease, unspecified: Secondary | ICD-10-CM | POA: Insufficient documentation

## 2013-08-09 DIAGNOSIS — I359 Nonrheumatic aortic valve disorder, unspecified: Secondary | ICD-10-CM | POA: Insufficient documentation

## 2013-08-09 DIAGNOSIS — I452 Bifascicular block: Secondary | ICD-10-CM

## 2013-08-09 NOTE — Telephone Encounter (Signed)
Advised patient and will send to Dr Bosie Helper office

## 2013-08-09 NOTE — Telephone Encounter (Signed)
Message copied by Burnell Blanks on Wed Aug 09, 2013  4:12 PM ------      Message from: Cassell Clement      Created: Wed Aug 09, 2013  2:27 PM       His echo and his nuclear study were good.  No significant ischemia and his aortic prosthetic valve is doing well.  OK for AAA surgery. Send copies of studies to vascular surgeon. ------

## 2013-08-09 NOTE — Telephone Encounter (Signed)
Message copied by Burnell Blanks on Wed Aug 09, 2013  4:12 PM ------      Message from: Cassell Clement      Created: Wed Aug 09, 2013  2:28 PM       Nuclear study good.  Okay for AAA surgery. ------

## 2013-08-09 NOTE — Progress Notes (Signed)
Echocardiogram performed.  

## 2013-08-14 ENCOUNTER — Encounter: Payer: Self-pay | Admitting: Vascular Surgery

## 2013-08-15 ENCOUNTER — Encounter: Payer: Self-pay | Admitting: Vascular Surgery

## 2013-08-15 ENCOUNTER — Encounter (HOSPITAL_COMMUNITY): Payer: Self-pay | Admitting: Pharmacy Technician

## 2013-08-15 ENCOUNTER — Encounter: Payer: Self-pay | Admitting: *Deleted

## 2013-08-15 ENCOUNTER — Ambulatory Visit (INDEPENDENT_AMBULATORY_CARE_PROVIDER_SITE_OTHER): Payer: Medicare Other | Admitting: Vascular Surgery

## 2013-08-15 ENCOUNTER — Ambulatory Visit
Admission: RE | Admit: 2013-08-15 | Discharge: 2013-08-15 | Disposition: A | Discharge status: Home / Self Care | Payer: Medicare Other | Source: Ambulatory Visit | Attending: Vascular Surgery | Admitting: Vascular Surgery

## 2013-08-15 ENCOUNTER — Other Ambulatory Visit: Payer: Self-pay | Admitting: *Deleted

## 2013-08-15 VITALS — BP 192/85 | HR 63 | Resp 18 | Ht 66.0 in | Wt 157.0 lb

## 2013-08-15 DIAGNOSIS — I714 Abdominal aortic aneurysm, without rupture, unspecified: Secondary | ICD-10-CM | POA: Insufficient documentation

## 2013-08-15 MED ORDER — IOHEXOL 350 MG/ML SOLN
80.0000 mL | Freq: Once | INTRAVENOUS | Status: AC | PRN
  Administered 2013-08-15: 80 mL via INTRAVENOUS

## 2013-08-15 NOTE — Progress Notes (Signed)
Patient name: [name] [name] MRN: [id number] DOB: 1930-05-22 Sex: male   Referred by: Pete Glatter  Reason for referral:  Chief Complaint  Patient presents with  . AAA    evaluate AAA was seen as hospital consult    HISTORY OF PRESENT ILLNESS: The patient presents today for continued discussion of his infrarenal abdominal aortic aneurysm. He had been admitted to the hospital with left leg pain and back pain which was felt to be sciatica in nature. MRI in the hospital revealed a large infrarenal abdominal aortic aneurysm. This was asymptomatic. I discussed this with him in the hospital and recommended that we see him with CT angiogram for further evaluation as an outpatient. He underwent CT angiogram this morning and we are seeing him this afternoon for further discussion. He reports that he has had marked improvement in his leg discomfort. He has had cardiac clearance with a 2-D echocardiogram and Cardiolite study which showed both showed him to be low risk from a cardiac standpoint. He has no symptoms referable to his aneurysm.  Past Medical History  Diagnosis Date  . Aortic stenosis     s/p AVR in 2004  . Spasmodic torticollis     recent with persistant headaches  . Sinus bradycardia   . Bifascicular block   . Hypercholesterolemia     On Simvastatin  . Coronary artery disease     s/p CABG in 2004  . Right bundle branch block   . Abnormal Holter monitor finding November 2012    Runs of SVT with profound bradycardia  . AAA (abdominal aortic aneurysm)     Past Surgical History  Procedure Laterality Date  . Coronary artery bypass graft  10-30-02    x8 per Dr. Laneta Simmers with LIMA to LAD, SVG to PD, SVG to 2nd DX/1st DX, & SVG to subbranch of the 1st DX & 1st OM  . Aortic valve replacement  10-30-02    insertion of a bioprosthetic #73mm AVR    History   Social History  . Marital Status: Widowed    Spouse Name: N/A    Number of Children: N/A  . Years of Education: N/A    Occupational History  . Not on file.   Social History Main Topics  . Smoking status: Former Smoker    Types: Cigarettes    Quit date: 08/15/1953  . Smokeless tobacco: Former Neurosurgeon    Quit date: 08/15/1954  . Alcohol Use: No  . Drug Use: No  . Sexual Activity: Not on file   Other Topics Concern  . Not on file   Social History Narrative  . No narrative on file    Family History  Problem Relation Age of Onset  . Hypertension Mother   . Hypertension Sister     Allergies as of 08/15/2013  . (No Known Allergies)    Current Outpatient Prescriptions on File Prior to Visit  Medication Sig Dispense Refill  . amLODipine (NORVASC) 2.5 MG tablet Take 2.5-5 mg by mouth 2 (two) times daily. Takes 5mg  in morning and 2.5mg  at bedtime      . aspirin EC 325 MG tablet Take 325 mg by mouth every other day.      . bimatoprost (LUMIGAN) 0.03 % ophthalmic solution Place 1 drop into both eyes at bedtime.       . brimonidine (ALPHAGAN P) 0.1 % SOLN Place 1 drop into both eyes every evening.      . furosemide (LASIX) 80 MG tablet Take  80 mg by mouth daily.      Marland Kitchen guaiFENesin (MUCINEX) 600 MG 12 hr tablet Take 600 mg by mouth daily as needed for to loosen phlegm.      . loratadine (CLARITIN) 10 MG tablet Take 10 mg by mouth daily as needed for allergies.      . meloxicam (MOBIC) 7.5 MG tablet Take 1 tablet (7.5 mg total) by mouth daily.  30 tablet  0  . potassium chloride SA (K-DUR,KLOR-CON) 20 MEQ tablet Take 20 mEq by mouth daily.      . rosuvastatin (CRESTOR) 5 MG tablet Take 5 mg by mouth daily.        . cyclobenzaprine (FLEXERIL) 10 MG tablet Take 1 tablet (10 mg total) by mouth 3 (three) times daily as needed for muscle spasms.  30 tablet  0  . traMADol (ULTRAM) 50 MG tablet Take 1 tablet (50 mg total) by mouth every 6 (six) hours as needed for severe pain.  30 tablet  0   No current facility-administered medications on file prior to visit.     PHYSICAL EXAMINATION:  General: The  patient is a well-nourished male, in no acute distress. Vital signs are BP 192/85  Pulse 63  Resp 18  Ht 5\' 6"  (1.676 m)  Wt 157 lb (71.215 kg)  BMI 25.35 kg/m2 Pulmonary: There is a good air exchange bilaterally without wheezing or rales. Abdomen: Soft and non-tender with normal pitch bowel sounds. Prominent aortic pulsation Musculoskeletal: There are no major deformities.  There is no significant extremity pain. Neurologic: No focal weakness or paresthesias are detected, Skin: There are no ulcer or rashes noted. Psychiatric: The patient has normal affect. Cardiovascular: There is a regular rate and rhythm without significant murmur appreciated. Pulse status 2+ radial and 2+ femoral pulses bilaterally  CT scan today shows 5.5 cm infrarenal abdominal aortic aneurysm. He has an adequate infrarenal neck for stent graft. He does have some ectasia of his iliac arteries but he is a stent graft candidate. He does have some atherosclerotic changes of this the femoral vessels but does have adequate vessels for delivery of the stent graft  Impression and Plan:  5.5 cm infrarenal abdominal aortic aneurysm. I discussed this with the patient. I have recommended elective stent graft repair. I explained the procedure which would be expected that one night hospitalization and early return to his baseline. I explained either cutdown or most likely puncture closure of his femoral artery access. We will schedule this at his earliest in the    EARLY, TODD [facility] Office: [phone]

## 2013-08-16 NOTE — Pre-Procedure Instructions (Signed)
Laray Corbit  08/16/2013   Your procedure is scheduled on:  08/18/13  Report to [facility]  2 * 3 at 530 AM.  Call this number if you have problems the morning of surgery: [phone]   Remember:   Do not eat food or drink liquids after midnight.   Take these medicines the morning of surgery with A SIP OF WATER: norvasc,claritin,flexeril, eye drops   Do not wear jewelry, make-up or nail polish.  Do not wear lotions, powders, or perfumes. You may wear deodorant.  Do not shave 48 hours prior to surgery. Men may shave face and neck.  Do not bring valuables to the hospital.  [facility] is not responsible                  for any belongings or valuables.               Contacts, dentures or bridgework may not be worn into surgery.  Leave suitcase in the car. After surgery it may be brought to your room.  For patients admitted to the hospital, discharge time is determined by your                treatment team.               Patients discharged the day of surgery will not be allowed to drive  home.  Name and phone number of your driver: family  Special Instructions: Incentive Spirometry - Practice and bring it with you on the day of surgery.   Please read over the following fact sheets that you were given: Pain Booklet, Coughing and Deep Breathing and Surgical Site Infection Prevention

## 2013-08-17 ENCOUNTER — Inpatient Hospital Stay (HOSPITAL_COMMUNITY)
Admission: RE | Admit: 2013-08-17 | Discharge: 2013-08-17 | Disposition: A | Discharge status: Home / Self Care | Payer: Medicare Other | Source: Ambulatory Visit

## 2013-08-17 ENCOUNTER — Other Ambulatory Visit: Payer: Medicare Other

## 2013-08-17 ENCOUNTER — Encounter (HOSPITAL_COMMUNITY): Payer: Self-pay

## 2013-08-17 MED ORDER — DEXTROSE 5 % IV SOLN: 1.5000 g | INTRAVENOUS | Status: DC

## 2013-08-17 MED ORDER — DEXTROSE 5 % IV SOLN
1.5000 g | Freq: Once | INTRAVENOUS | Status: AC
  Administered 2013-08-18: 1.5 g via INTRAVENOUS
  Filled 2013-08-17: qty 1.5

## 2013-08-18 ENCOUNTER — Inpatient Hospital Stay (HOSPITAL_COMMUNITY)
Admission: RE | Admit: 2013-08-18 | Discharge: 2013-08-19 | DRG: 238 | Disposition: A | Discharge status: Home / Self Care | Payer: Medicare Other | Source: Ambulatory Visit | Attending: Vascular Surgery | Admitting: Vascular Surgery

## 2013-08-18 ENCOUNTER — Inpatient Hospital Stay (HOSPITAL_COMMUNITY): Payer: Medicare Other

## 2013-08-18 ENCOUNTER — Encounter (HOSPITAL_COMMUNITY)
Admission: RE | Disposition: A | Discharge status: Home / Self Care | Payer: Self-pay | Source: Ambulatory Visit | Attending: Vascular Surgery

## 2013-08-18 ENCOUNTER — Other Ambulatory Visit: Payer: Self-pay | Admitting: *Deleted

## 2013-08-18 ENCOUNTER — Encounter (HOSPITAL_COMMUNITY): Payer: Medicare Other | Admitting: Anesthesiology

## 2013-08-18 ENCOUNTER — Inpatient Hospital Stay (HOSPITAL_COMMUNITY): Payer: Medicare Other | Admitting: Anesthesiology

## 2013-08-18 ENCOUNTER — Encounter (HOSPITAL_COMMUNITY): Payer: Self-pay | Admitting: *Deleted

## 2013-08-18 DIAGNOSIS — Z8249 Family history of ischemic heart disease and other diseases of the circulatory system: Secondary | ICD-10-CM

## 2013-08-18 DIAGNOSIS — I2510 Atherosclerotic heart disease of native coronary artery without angina pectoris: Secondary | ICD-10-CM | POA: Diagnosis present

## 2013-08-18 DIAGNOSIS — I714 Abdominal aortic aneurysm, without rupture, unspecified: Principal | ICD-10-CM | POA: Diagnosis present

## 2013-08-18 DIAGNOSIS — Z48812 Encounter for surgical aftercare following surgery on the circulatory system: Secondary | ICD-10-CM

## 2013-08-18 DIAGNOSIS — Z951 Presence of aortocoronary bypass graft: Secondary | ICD-10-CM

## 2013-08-18 DIAGNOSIS — Z7982 Long term (current) use of aspirin: Secondary | ICD-10-CM

## 2013-08-18 DIAGNOSIS — Z952 Presence of prosthetic heart valve: Secondary | ICD-10-CM

## 2013-08-18 DIAGNOSIS — E7800 Pure hypercholesterolemia, unspecified: Secondary | ICD-10-CM | POA: Diagnosis present

## 2013-08-18 DIAGNOSIS — G243 Spasmodic torticollis: Secondary | ICD-10-CM | POA: Diagnosis present

## 2013-08-18 DIAGNOSIS — Z87891 Personal history of nicotine dependence: Secondary | ICD-10-CM

## 2013-08-18 DIAGNOSIS — Z79899 Other long term (current) drug therapy: Secondary | ICD-10-CM

## 2013-08-18 HISTORY — PX: ABDOMINAL AORTIC ENDOVASCULAR STENT GRAFT: SHX5707

## 2013-08-18 LAB — CBC
HCT: 40 % (ref 39.0–52.0)
HCT: 33.5 % — ABNORMAL LOW (ref 39.0–52.0)
Hemoglobin: 13.7 g/dL (ref 13.0–17.0)
Hemoglobin: 11.5 g/dL — ABNORMAL LOW (ref 13.0–17.0)
MCH: 31.8 pg (ref 26.0–34.0)
MCHC: 34.3 g/dL (ref 30.0–36.0)
MCV: 92.5 fL (ref 78.0–100.0)
Platelets: 131 10*3/uL — ABNORMAL LOW (ref 150–400)
Platelets: 98 10*3/uL — ABNORMAL LOW (ref 150–400)
RBC: 3.62 MIL/uL — ABNORMAL LOW (ref 4.22–5.81)
RDW: 13.8 % (ref 11.5–15.5)
WBC: 5.9 10*3/uL (ref 4.0–10.5)

## 2013-08-18 LAB — BASIC METABOLIC PANEL
BUN: 10 mg/dL (ref 6–23)
CO2: 25 mEq/L (ref 19–32)
Calcium: 7.8 mg/dL — ABNORMAL LOW (ref 8.4–10.5)
Chloride: 104 mEq/L (ref 96–112)
Creatinine, Ser: 0.8 mg/dL (ref 0.50–1.35)
GFR calc non Af Amer: 82 mL/min — ABNORMAL LOW (ref >90)
Glucose, Bld: 93 mg/dL (ref 70–99)

## 2013-08-18 LAB — TYPE AND SCREEN

## 2013-08-18 LAB — COMPREHENSIVE METABOLIC PANEL
ALT: 19 U/L (ref 0–53)
AST: 24 U/L (ref 0–37)
Albumin: 4.3 g/dL (ref 3.5–5.2)
Alkaline Phosphatase: 66 U/L (ref 39–117)
BUN: 12 mg/dL (ref 6–23)
Chloride: 101 mEq/L (ref 96–112)
Potassium: 3.7 mEq/L (ref 3.5–5.1)
Total Bilirubin: 0.6 mg/dL (ref 0.3–1.2)

## 2013-08-18 LAB — URINALYSIS, ROUTINE W REFLEX MICROSCOPIC
Leukocytes, UA: NEGATIVE
Nitrite: NEGATIVE
Specific Gravity, Urine: 1.021 (ref 1.005–1.030)
pH: 6 (ref 5.0–8.0)

## 2013-08-18 LAB — APTT
aPTT: 35 seconds (ref 24–37)
aPTT: 36 seconds (ref 24–37)

## 2013-08-18 LAB — SURGICAL PCR SCREEN
MRSA, PCR: NEGATIVE
Staphylococcus aureus: NEGATIVE

## 2013-08-18 LAB — MAGNESIUM: Magnesium: 1.8 mg/dL (ref 1.5–2.5)

## 2013-08-18 SURGERY — INSERTION, ENDOVASCULAR STENT GRAFT, AORTA, ABDOMINAL
Anesthesia: General

## 2013-08-18 MED ORDER — ROCURONIUM BROMIDE 100 MG/10ML IV SOLN
INTRAVENOUS | Status: DC | PRN
  Administered 2013-08-18: 50 mg via INTRAVENOUS

## 2013-08-18 MED ORDER — ALUM & MAG HYDROXIDE-SIMETH 200-200-20 MG/5ML PO SUSP: 15.0000 mL | ORAL | Status: DC | PRN

## 2013-08-18 MED ORDER — GUAIFENESIN-DM 100-10 MG/5ML PO SYRP: 15.0000 mL | ORAL_SOLUTION | ORAL | Status: DC | PRN

## 2013-08-18 MED ORDER — PROPOFOL 10 MG/ML IV BOLUS
INTRAVENOUS | Status: DC | PRN
  Administered 2013-08-18: 120 mg via INTRAVENOUS

## 2013-08-18 MED ORDER — PROTAMINE SULFATE 10 MG/ML IV SOLN
INTRAVENOUS | Status: DC | PRN
  Administered 2013-08-18: 20 mg via INTRAVENOUS
  Administered 2013-08-18: 10 mg via INTRAVENOUS
  Administered 2013-08-18: 20 mg via INTRAVENOUS

## 2013-08-18 MED ORDER — SODIUM CHLORIDE 0.9 % IR SOLN
Status: DC | PRN
  Administered 2013-08-18: 07:00:00

## 2013-08-18 MED ORDER — OXYCODONE HCL 5 MG PO TABS: 5.0000 mg | ORAL_TABLET | Freq: Four times a day (QID) | ORAL | Status: DC | PRN

## 2013-08-18 MED ORDER — MAGNESIUM SULFATE 40 MG/ML IJ SOLN
2.0000 g | Freq: Once | INTRAMUSCULAR | Status: AC | PRN
  Filled 2013-08-18: qty 50

## 2013-08-18 MED ORDER — DEXTROSE 5 % IV SOLN
1.5000 g | Freq: Two times a day (BID) | INTRAVENOUS | Status: AC
  Administered 2013-08-18 – 2013-08-19 (×2): 1.5 g via INTRAVENOUS
  Filled 2013-08-18 (×2): qty 1.5

## 2013-08-18 MED ORDER — BRIMONIDINE TARTRATE 0.15 % OP SOLN: 1.0000 [drp] | Freq: Three times a day (TID) | OPHTHALMIC | Status: DC

## 2013-08-18 MED ORDER — 0.9 % SODIUM CHLORIDE (POUR BTL) OPTIME
TOPICAL | Status: DC | PRN
  Administered 2013-08-18: 1000 mL

## 2013-08-18 MED ORDER — GUAIFENESIN ER 600 MG PO TB12
600.0000 mg | ORAL_TABLET | Freq: Every day | ORAL | Status: DC | PRN
  Filled 2013-08-18: qty 1

## 2013-08-18 MED ORDER — GLYCOPYRROLATE 0.2 MG/ML IJ SOLN
INTRAMUSCULAR | Status: DC | PRN
  Administered 2013-08-18: 0.6 mg via INTRAVENOUS

## 2013-08-18 MED ORDER — POTASSIUM CHLORIDE CRYS ER 20 MEQ PO TBCR: 20.0000 meq | EXTENDED_RELEASE_TABLET | Freq: Once | ORAL | Status: AC | PRN

## 2013-08-18 MED ORDER — DOCUSATE SODIUM 100 MG PO CAPS
100.0000 mg | ORAL_CAPSULE | Freq: Every day | ORAL | Status: DC
  Administered 2013-08-19: 100 mg via ORAL
  Filled 2013-08-18: qty 1

## 2013-08-18 MED ORDER — TRAMADOL HCL 50 MG PO TABS: 50.0000 mg | ORAL_TABLET | Freq: Four times a day (QID) | ORAL | Status: DC | PRN

## 2013-08-18 MED ORDER — LABETALOL HCL 5 MG/ML IV SOLN: 10.0000 mg | INTRAVENOUS | Status: DC | PRN

## 2013-08-18 MED ORDER — OXYCODONE HCL 5 MG PO TABS: 5.0000 mg | ORAL_TABLET | Freq: Once | ORAL | Status: DC | PRN

## 2013-08-18 MED ORDER — IODIXANOL 320 MG/ML IV SOLN
INTRAVENOUS | Status: DC | PRN
  Administered 2013-08-18: 60.5 mL via INTRAVENOUS

## 2013-08-18 MED ORDER — MORPHINE SULFATE 2 MG/ML IJ SOLN: 2.0000 mg | INTRAMUSCULAR | Status: DC | PRN

## 2013-08-18 MED ORDER — AMLODIPINE BESYLATE 2.5 MG PO TABS: 2.5000 mg | ORAL_TABLET | Freq: Two times a day (BID) | ORAL | Status: DC

## 2013-08-18 MED ORDER — FENTANYL CITRATE 0.05 MG/ML IJ SOLN
INTRAMUSCULAR | Status: DC | PRN
  Administered 2013-08-18 (×2): 50 ug via INTRAVENOUS

## 2013-08-18 MED ORDER — PANTOPRAZOLE SODIUM 40 MG PO TBEC
40.0000 mg | DELAYED_RELEASE_TABLET | Freq: Every day | ORAL | Status: DC
  Administered 2013-08-19: 40 mg via ORAL
  Filled 2013-08-18: qty 1

## 2013-08-18 MED ORDER — SENNOSIDES-DOCUSATE SODIUM 8.6-50 MG PO TABS
1.0000 | ORAL_TABLET | Freq: Every evening | ORAL | Status: DC | PRN
  Administered 2013-08-18: 1 via ORAL
  Filled 2013-08-18: qty 1

## 2013-08-18 MED ORDER — NEOSTIGMINE METHYLSULFATE 1 MG/ML IJ SOLN
INTRAMUSCULAR | Status: DC | PRN
  Administered 2013-08-18: 4 mg via INTRAVENOUS

## 2013-08-18 MED ORDER — POTASSIUM CHLORIDE CRYS ER 20 MEQ PO TBCR
20.0000 meq | EXTENDED_RELEASE_TABLET | Freq: Every day | ORAL | Status: DC
  Administered 2013-08-18 – 2013-08-19 (×2): 20 meq via ORAL
  Filled 2013-08-18 (×2): qty 1

## 2013-08-18 MED ORDER — SODIUM CHLORIDE 0.9 % IV SOLN: 500.0000 mL | Freq: Once | INTRAVENOUS | Status: AC | PRN

## 2013-08-18 MED ORDER — LIDOCAINE HCL (CARDIAC) 20 MG/ML IV SOLN
INTRAVENOUS | Status: DC | PRN
  Administered 2013-08-18: 60 mg via INTRAVENOUS

## 2013-08-18 MED ORDER — HYDRALAZINE HCL 20 MG/ML IJ SOLN: 10.0000 mg | INTRAMUSCULAR | Status: DC | PRN

## 2013-08-18 MED ORDER — OXYCODONE HCL 5 MG/5ML PO SOLN: 5.0000 mg | Freq: Once | ORAL | Status: DC | PRN

## 2013-08-18 MED ORDER — LATANOPROST 0.005 % OP SOLN
1.0000 [drp] | Freq: Every day | OPHTHALMIC | Status: DC
  Filled 2013-08-18: qty 2.5

## 2013-08-18 MED ORDER — AMLODIPINE BESYLATE 5 MG PO TABS
5.0000 mg | ORAL_TABLET | Freq: Every day | ORAL | Status: DC
  Administered 2013-08-19: 5 mg via ORAL
  Filled 2013-08-18: qty 1

## 2013-08-18 MED ORDER — MELOXICAM 7.5 MG PO TABS
7.5000 mg | ORAL_TABLET | Freq: Every day | ORAL | Status: DC
  Administered 2013-08-19: 7.5 mg via ORAL
  Filled 2013-08-18: qty 1

## 2013-08-18 MED ORDER — SODIUM CHLORIDE 0.9 % IV SOLN
10.0000 mg | INTRAVENOUS | Status: DC | PRN
  Administered 2013-08-18: 5 ug/min via INTRAVENOUS

## 2013-08-18 MED ORDER — BISACODYL 10 MG RE SUPP: 10.0000 mg | Freq: Every day | RECTAL | Status: DC | PRN

## 2013-08-18 MED ORDER — FUROSEMIDE 80 MG PO TABS
80.0000 mg | ORAL_TABLET | Freq: Every day | ORAL | Status: DC
  Administered 2013-08-18 – 2013-08-19 (×2): 80 mg via ORAL
  Filled 2013-08-18 (×2): qty 1

## 2013-08-18 MED ORDER — NITROGLYCERIN 0.2 MG/ML ON CALL CATH LAB
INTRAVENOUS | Status: DC | PRN
  Administered 2013-08-18 (×2): 20 ug via INTRAVENOUS
  Administered 2013-08-18 (×2): 40 ug via INTRAVENOUS

## 2013-08-18 MED ORDER — SODIUM CHLORIDE 0.9 % IV SOLN: INTRAVENOUS | Status: DC

## 2013-08-18 MED ORDER — BRIMONIDINE TARTRATE 0.2 % OP SOLN
1.0000 [drp] | Freq: Three times a day (TID) | OPHTHALMIC | Status: DC
  Administered 2013-08-18 – 2013-08-19 (×2): 1 [drp] via OPHTHALMIC
  Filled 2013-08-18: qty 5

## 2013-08-18 MED ORDER — AMLODIPINE BESYLATE 2.5 MG PO TABS
2.5000 mg | ORAL_TABLET | Freq: Every day | ORAL | Status: DC
  Filled 2013-08-18 (×2): qty 1

## 2013-08-18 MED ORDER — LORATADINE 10 MG PO TABS
10.0000 mg | ORAL_TABLET | Freq: Every day | ORAL | Status: DC | PRN
  Filled 2013-08-18: qty 1

## 2013-08-18 MED ORDER — HEPARIN SODIUM (PORCINE) 1000 UNIT/ML IJ SOLN
INTRAMUSCULAR | Status: DC | PRN
  Administered 2013-08-18: 5000 [IU] via INTRAVENOUS

## 2013-08-18 MED ORDER — PHENOL 1.4 % MT LIQD: 1.0000 | OROMUCOSAL | Status: DC | PRN

## 2013-08-18 MED ORDER — ONDANSETRON HCL 4 MG/2ML IJ SOLN: 4.0000 mg | Freq: Four times a day (QID) | INTRAMUSCULAR | Status: DC | PRN

## 2013-08-18 MED ORDER — HYDROMORPHONE HCL PF 1 MG/ML IJ SOLN: 0.2500 mg | INTRAMUSCULAR | Status: DC | PRN

## 2013-08-18 MED ORDER — DOPAMINE-DEXTROSE 3.2-5 MG/ML-% IV SOLN: 3.0000 ug/kg/min | INTRAVENOUS | Status: DC

## 2013-08-18 MED ORDER — ACETAMINOPHEN 325 MG PO TABS: 325.0000 mg | ORAL_TABLET | ORAL | Status: DC | PRN

## 2013-08-18 MED ORDER — ENOXAPARIN SODIUM 30 MG/0.3ML ~~LOC~~ SOLN
30.0000 mg | SUBCUTANEOUS | Status: DC
  Administered 2013-08-19: 30 mg via SUBCUTANEOUS
  Filled 2013-08-18 (×2): qty 0.3

## 2013-08-18 MED ORDER — CYCLOBENZAPRINE HCL 10 MG PO TABS: 10.0000 mg | ORAL_TABLET | Freq: Three times a day (TID) | ORAL | Status: DC | PRN

## 2013-08-18 MED ORDER — MUPIROCIN 2 % EX OINT
TOPICAL_OINTMENT | CUTANEOUS | Status: AC
  Administered 2013-08-18: 1
  Filled 2013-08-18: qty 22

## 2013-08-18 MED ORDER — ONDANSETRON HCL 4 MG/2ML IJ SOLN
INTRAMUSCULAR | Status: DC | PRN
  Administered 2013-08-18: 4 mg via INTRAVENOUS

## 2013-08-18 MED ORDER — ASPIRIN EC 325 MG PO TBEC
325.0000 mg | DELAYED_RELEASE_TABLET | ORAL | Status: DC
  Administered 2013-08-19: 325 mg via ORAL
  Filled 2013-08-18: qty 1

## 2013-08-18 MED ORDER — OXYCODONE-ACETAMINOPHEN 5-325 MG PO TABS
1.0000 | ORAL_TABLET | ORAL | Status: DC | PRN
  Administered 2013-08-18: 1 via ORAL
  Filled 2013-08-18: qty 1

## 2013-08-18 MED ORDER — LACTATED RINGERS IV SOLN
INTRAVENOUS | Status: DC | PRN
  Administered 2013-08-18: 07:00:00 via INTRAVENOUS

## 2013-08-18 MED ORDER — METOPROLOL TARTRATE 1 MG/ML IV SOLN: 2.0000 mg | INTRAVENOUS | Status: DC | PRN

## 2013-08-18 MED ORDER — ACETAMINOPHEN 325 MG RE SUPP
325.0000 mg | RECTAL | Status: DC | PRN
  Filled 2013-08-18: qty 2

## 2013-08-18 MED ORDER — LABETALOL HCL 5 MG/ML IV SOLN
INTRAVENOUS | Status: DC | PRN
  Administered 2013-08-18: 5 mg via INTRAVENOUS

## 2013-08-18 MED ORDER — ATORVASTATIN CALCIUM 10 MG PO TABS
10.0000 mg | ORAL_TABLET | Freq: Every day | ORAL | Status: DC
  Administered 2013-08-18: 10 mg via ORAL
  Filled 2013-08-18 (×2): qty 1

## 2013-08-18 SURGICAL SUPPLY — 84 items
BAG BANDED W/RUBBER/TAPE 36X54 (MISCELLANEOUS) ×2 IMPLANT
BAG SNAP BAND KOVER 36X36 (MISCELLANEOUS) ×2 IMPLANT
BALLN MUSTANG 10.0X40 75 (BALLOONS) ×2
BALLOON MUSTANG 10.0X40 75 (BALLOONS) ×1 IMPLANT
BENZOIN TINCTURE PRP APPL 2/3 (GAUZE/BANDAGES/DRESSINGS) ×2 IMPLANT
BLADE SURG ROTATE 9660 (MISCELLANEOUS) IMPLANT
CANISTER SUCTION 2500CC (MISCELLANEOUS) ×2 IMPLANT
CATH BEACON 5.038 65CM KMP-01 (CATHETERS) ×2 IMPLANT
CATH OMNI FLUSH .035X70CM (CATHETERS) ×2 IMPLANT
CATH VANSCH 5FR 6CM (CATHETERS) ×2 IMPLANT
CLIP LIGATING EXTRA MED SLVR (CLIP) IMPLANT
CLIP LIGATING EXTRA SM BLUE (MISCELLANEOUS) IMPLANT
COVER DOME SNAP 22 D (MISCELLANEOUS) ×2 IMPLANT
COVER MAYO STAND STRL (DRAPES) ×2 IMPLANT
COVER PROBE W GEL 5X96 (DRAPES) ×2 IMPLANT
COVER SURGICAL LIGHT HANDLE (MISCELLANEOUS) ×2 IMPLANT
DEVICE CLOSURE PERCLS PRGLD 6F (VASCULAR PRODUCTS) ×4 IMPLANT
DEVICE TORQUE H2O (MISCELLANEOUS) ×2 IMPLANT
DRAPE TABLE COVER HEAVY DUTY (DRAPES) ×2 IMPLANT
DRESSING OPSITE X SMALL 2X3 (GAUZE/BANDAGES/DRESSINGS) ×4 IMPLANT
DRYSEAL FLEXSHEATH 12FR 33CM (SHEATH) ×1
DRYSEAL FLEXSHEATH 16FR 33CM (SHEATH) ×1
ELECT CAUTERY BLADE 6.4 (BLADE) IMPLANT
ELECT REM PT RETURN 9FT ADLT (ELECTROSURGICAL) ×4
ELECTRODE REM PT RTRN 9FT ADLT (ELECTROSURGICAL) ×2 IMPLANT
EXCLUDER TNK LEG 26MX12X14 (Endovascular Graft) ×1 IMPLANT
EXCLUDER TRUNK LEG 26MX12X14 (Endovascular Graft) ×2 IMPLANT
GAUZE SPONGE 2X2 8PLY STRL LF (GAUZE/BANDAGES/DRESSINGS) ×2 IMPLANT
GLOVE BIO SURGEON STRL SZ7.5 (GLOVE) ×2 IMPLANT
GLOVE BIOGEL PI IND STRL 7.0 (GLOVE) ×1 IMPLANT
GLOVE BIOGEL PI IND STRL 7.5 (GLOVE) ×2 IMPLANT
GLOVE BIOGEL PI IND STRL 8 (GLOVE) ×2 IMPLANT
GLOVE BIOGEL PI INDICATOR 7.0 (GLOVE) ×1
GLOVE BIOGEL PI INDICATOR 7.5 (GLOVE) ×2
GLOVE BIOGEL PI INDICATOR 8 (GLOVE) ×2
GLOVE ECLIPSE 6.5 STRL STRAW (GLOVE) ×2 IMPLANT
GLOVE SS BIOGEL STRL SZ 7 (GLOVE) ×1 IMPLANT
GLOVE SS BIOGEL STRL SZ 7.5 (GLOVE) ×1 IMPLANT
GLOVE SUPERSENSE BIOGEL SZ 7 (GLOVE) ×1
GLOVE SUPERSENSE BIOGEL SZ 7.5 (GLOVE) ×1
GOWN PREVENTION PLUS XLARGE (GOWN DISPOSABLE) ×2 IMPLANT
GOWN STRL NON-REIN LRG LVL3 (GOWN DISPOSABLE) ×6 IMPLANT
GRAFT BALLN CATH 65CM (STENTS) ×1 IMPLANT
GUIDEWIRE ANGLED .035X150CM (WIRE) ×2 IMPLANT
KIT BASIN OR (CUSTOM PROCEDURE TRAY) ×2 IMPLANT
KIT ENCORE 26 ADVANTAGE (KITS) ×2 IMPLANT
KIT ROOM TURNOVER OR (KITS) ×2 IMPLANT
LEG CONTRALATERAL 20X11.5 (Vascular Products) ×1 IMPLANT
NEEDLE PERC 18GX7CM (NEEDLE) ×2 IMPLANT
NS IRRIG 1000ML POUR BTL (IV SOLUTION) ×2 IMPLANT
PACK AORTA (CUSTOM PROCEDURE TRAY) ×2 IMPLANT
PAD ARMBOARD 7.5X6 YLW CONV (MISCELLANEOUS) ×4 IMPLANT
PENCIL BUTTON HOLSTER BLD 10FT (ELECTRODE) IMPLANT
PERCLOSE PROGLIDE 6F (VASCULAR PRODUCTS) ×8
PROTECTION STATION PRESSURIZED (MISCELLANEOUS) ×2
SHEATH AVANTI 11CM 8FR (MISCELLANEOUS) ×2 IMPLANT
SHEATH BRITE TIP 8FR 23CM (MISCELLANEOUS) ×2 IMPLANT
SHEATH DRYSEAL FLEX 12FR 33CM (SHEATH) ×1 IMPLANT
SHEATH DRYSEAL FLEX 16FR 33CM (SHEATH) ×1 IMPLANT
SPONGE GAUZE 2X2 STER 10/PKG (GAUZE/BANDAGES/DRESSINGS) ×2
STAPLER VISISTAT 35W (STAPLE) IMPLANT
STATION PROTECTION PRESSURIZED (MISCELLANEOUS) ×1 IMPLANT
STENT GRAFT BALLN CATH 65CM (STENTS) ×1
STENT GRAFT CONTRALAT 20X11.5 (Vascular Products) ×1 IMPLANT
STOPCOCK MORSE 400PSI 3WAY (MISCELLANEOUS) ×2 IMPLANT
STRIP CLOSURE SKIN 1/2X4 (GAUZE/BANDAGES/DRESSINGS) ×2 IMPLANT
SUT ETHILON 3 0 PS 1 (SUTURE) IMPLANT
SUT PROLENE 5 0 C 1 24 (SUTURE) IMPLANT
SUT VIC AB 2-0 CTX 36 (SUTURE) IMPLANT
SUT VIC AB 3-0 SH 18 (SUTURE) IMPLANT
SUT VIC AB 3-0 SH 27 (SUTURE)
SUT VIC AB 3-0 SH 27X BRD (SUTURE) IMPLANT
SUT VICRYL 4-0 PS2 18IN ABS (SUTURE) ×4 IMPLANT
SYR 20CC LL (SYRINGE) ×4 IMPLANT
SYR 30ML LL (SYRINGE) IMPLANT
SYR 5ML LL (SYRINGE) ×2 IMPLANT
SYR MEDRAD MARK V 150ML (SYRINGE) ×2 IMPLANT
SYRINGE 10CC LL (SYRINGE) ×6 IMPLANT
TOWEL OR 17X24 6PK STRL BLUE (TOWEL DISPOSABLE) ×4 IMPLANT
TOWEL OR 17X26 10 PK STRL BLUE (TOWEL DISPOSABLE) ×4 IMPLANT
TRAY FOLEY CATH 16FRSI W/METER (SET/KITS/TRAYS/PACK) ×2 IMPLANT
TUBING HIGH PRESSURE 120CM (CONNECTOR) ×2 IMPLANT
WIRE AMPLATZ SS-J .035X180CM (WIRE) ×4 IMPLANT
WIRE BENTSON .035X145CM (WIRE) ×4 IMPLANT

## 2013-08-18 NOTE — Preoperative (Signed)
Beta Blockers   Reason not to administer Beta Blockers:Not Applicable 

## 2013-08-18 NOTE — Anesthesia Procedure Notes (Signed)
Procedure Name: Intubation Date/Time: 08/18/2013 7:37 AM Performed by: Ellin Goodie Pre-anesthesia Checklist: Patient identified, Emergency Drugs available, Suction available, Patient being monitored and Timeout performed Patient Re-evaluated:Patient Re-evaluated prior to inductionOxygen Delivery Method: Circle system utilized Preoxygenation: Pre-oxygenation with 100% oxygen Intubation Type: IV induction Ventilation: Mask ventilation without difficulty Laryngoscope Size: Mac and 4 Grade View: Grade I Tube type: Oral Tube size: 7.5 mm Number of attempts: 1 Airway Equipment and Method: Stylet Placement Confirmation: ETT inserted through vocal cords under direct vision,  positive ETCO2 and breath sounds checked- equal and bilateral Secured at: 22 cm Tube secured with: Tape Dental Injury: Teeth and Oropharynx as per pre-operative assessment  Comments: Atraumatic induction and intubation with MAC 4 blade.  Dr. Gypsy Balsam verified placement of ETT.  Carlynn Herald, CRNA

## 2013-08-18 NOTE — Op Note (Signed)
OPERATIVE REPORT  DATE OF SURGERY: 08/18/2013  PATIENT: [name] [name], 79 y.o. male MRN: [id number]  DOB: 1929-12-08  PRE-OPERATIVE DIAGNOSIS: Infrarenal abdominal aortic aneurysm  POST-OPERATIVE DIAGNOSIS:  Same  PROCEDURE: Gore stent graft repair of infrarenal abdominal aortic aneurysm  SURGEON:  Gretta Began, M.D.  PHYSICIAN ASSISTANT: Collins  ANESTHESIA:  Gen.  EBL: 100 ml  Total I/O In: 1000 [I.V.:1000] Out: 430 [Urine:430]  BLOOD ADMINISTERED: None  DRAINS: None  SPECIMEN: None  COUNTS CORRECT:  YES  PLAN OF CARE: PACU   PATIENT DISPOSITION:  PACU - hemodynamically stable  PROCEDURE DETAILS: The patient was taken up in place supervision an area of both groins are prepped and draped in usual sterile fashion. Using SonoSite ultrasound for visualization the right and left groins were accessed with 18-gauge needle with a single wall puncture. A guidewire was passed centrally this was confirmed with fluoroscopy. 2 different Perclose devices were partially deployed at 2:00 and 10:00 and the left in place for eventual closure. A 8 French sheath was passed bilaterally. On the right a pigtail catheter was positioned over the Jackpot wire and the Kevil wire was exchanged for an Amplatz superstiff wire. A 16 inch French dry seal sheath was passed over the Amplatz superstiff wire. The main body device was placed plus through the right groin and this was a 16 x 12 x 14 cm main body. This was positioned with the limbs crossed. The catheter was then positioned at the level of the renal arteries and AP projection was undertaken. This revealed the level renal arteries. The main body of the device was deployed and repeat the arteriogram showed good positioning with good flow into the renal arteries. The pigtail catheter was withdrawn back into the aortic sac and directional catheter was used with the angled Glidewire to cannulate the contralateral gate. Once this was accomplished the  pedicle cath was repositioned and was rolled in the aorta to confirm that this was in the device which it was. The Amplatz superstiff wire was then exchanged the pigtail catheter. Retrograde injection through the right groin sheath revealed location of the hypogastric artery takeoff. A 12 French dry fro sheath was then plaque passed over the Amplatz was exchanged for an 8 [place] sheath. A 20 x 11.5 cm limb was chosen and was deployed with the appropriate overlap the contralateral gate. Following this the ipsilateral limb of the main body device was completely deployed. The catheter was repositioned after a Coda balloon was used to gently dilate the upper and lower attachment sites and the junction. This did reveal some narrowing in the left iliac artery where the graft had been deployed. For this reason a 10 mm x 40 mm angioplasty balloon was brought onto the field and the left iliac artery was angioplastied inside the existing stent graft. Completion arteriogram revealed excellent positioning with no evidence of endoleak and no evidence of stenosis. Patient had been given 5000 units of intravenous heparin prior to placement of the 18 French sheath. The sheaths were removed and the Perclose devices had been placed were secured. This gave excellent hemostasis and the patient was given 50 g of protamine for reversal of the heparin. The incisions were closed with a single subcuticular 4-0 Vicryl suture. Sterile dressing was applied. The patient had easily palpable popliteal pulses and good Doppler signal in the anterior tibial and posterior tibial bilaterally. Patient was transferred to the recovery in stable condition   Gretta Began, M.D. 08/18/2013 12:06 PM

## 2013-08-18 NOTE — Transfer of Care (Signed)
Immediate Anesthesia Transfer of Care Note  Patient: [name] [name]  Procedure(s) Performed: Procedure(s): ABDOMINAL AORTIC ENDOVASCULAR STENT GRAFT (N/A)  Patient Location: PACU  Anesthesia Type:General  Level of Consciousness: alert  and sedated  Airway & Oxygen Therapy: Patient connected to face mask oxygen  Post-op Assessment: Report given to PACU RN  Post vital signs: stable  Complications: No apparent anesthesia complications

## 2013-08-18 NOTE — Interval H&P Note (Signed)
History and Physical Interval Note:  08/18/2013 7:18 AM  [name] [name]  has presented today for surgery, with the diagnosis of AAA  The various methods of treatment have been discussed with the patient and family. After consideration of risks, benefits and other options for treatment, the patient has consented to  Procedure(s): ABDOMINAL AORTIC ENDOVASCULAR STENT GRAFT (N/A) as a surgical intervention .  The patient's history has been reviewed, patient examined, no change in status, stable for surgery.  I have reviewed the patient's chart and labs.  Questions were answered to the patient's satisfaction.     Hallelujah Wysong

## 2013-08-18 NOTE — H&P (View-Only) (Signed)
   Patient name: [name] [name] MRN: [id number] DOB: 02/03/1930 Sex: male   Referred by: [place]  Reason for referral:  Chief Complaint  Patient presents with  . AAA    evaluate AAA was seen as hospital consult    HISTORY OF PRESENT ILLNESS: The patient presents today for continued discussion of his infrarenal abdominal aortic aneurysm. He had been admitted to the hospital with left leg pain and back pain which was felt to be sciatica in nature. MRI in the hospital revealed a large infrarenal abdominal aortic aneurysm. This was asymptomatic. I discussed this with him in the hospital and recommended that we see him with CT angiogram for further evaluation as an outpatient. He underwent CT angiogram this morning and we are seeing him this afternoon for further discussion. He reports that he has had marked improvement in his leg discomfort. He has had cardiac clearance with a 2-D echocardiogram and Cardiolite study which showed both showed him to be low risk from a cardiac standpoint. He has no symptoms referable to his aneurysm.  Past Medical History  Diagnosis Date  . Aortic stenosis     s/p AVR in 2004  . Spasmodic torticollis     recent with persistant headaches  . Sinus bradycardia   . Bifascicular block   . Hypercholesterolemia     On Simvastatin  . Coronary artery disease     s/p CABG in 2004  . Right bundle branch block   . Abnormal Holter monitor finding November 2012    Runs of SVT with profound bradycardia  . AAA (abdominal aortic aneurysm)     Past Surgical History  Procedure Laterality Date  . Coronary artery bypass graft  10-30-02    x8 per Dr. Bartle with LIMA to LAD, SVG to PD, SVG to 2nd DX/1st DX, & SVG to subbranch of the 1st DX & 1st OM  . Aortic valve replacement  10-30-02    insertion of a bioprosthetic #23mm AVR    History   Social History  . Marital Status: Widowed    Spouse Name: N/A    Number of Children: N/A  . Years of Education: N/A    Occupational History  . Not on file.   Social History Main Topics  . Smoking status: Former Smoker    Types: Cigarettes    Quit date: 08/15/1953  . Smokeless tobacco: Former User    Quit date: 08/15/1954  . Alcohol Use: No  . Drug Use: No  . Sexual Activity: Not on file   Other Topics Concern  . Not on file   Social History Narrative  . No narrative on file    Family History  Problem Relation Age of Onset  . Hypertension Mother   . Hypertension Sister     Allergies as of 08/15/2013  . (No Known Allergies)    Current Outpatient Prescriptions on File Prior to Visit  Medication Sig Dispense Refill  . amLODipine (NORVASC) 2.5 MG tablet Take 2.5-5 mg by mouth 2 (two) times daily. Takes 5mg in morning and 2.5mg at bedtime      . aspirin EC 325 MG tablet Take 325 mg by mouth every other day.      . bimatoprost (LUMIGAN) 0.03 % ophthalmic solution Place 1 drop into both eyes at bedtime.       . brimonidine (ALPHAGAN P) 0.1 % SOLN Place 1 drop into both eyes every evening.      . furosemide (LASIX) 80 MG tablet Take   80 mg by mouth daily.      . guaiFENesin (MUCINEX) 600 MG 12 hr tablet Take 600 mg by mouth daily as needed for to loosen phlegm.      . loratadine (CLARITIN) 10 MG tablet Take 10 mg by mouth daily as needed for allergies.      . meloxicam (MOBIC) 7.5 MG tablet Take 1 tablet (7.5 mg total) by mouth daily.  30 tablet  0  . potassium chloride SA (K-DUR,KLOR-CON) 20 MEQ tablet Take 20 mEq by mouth daily.      . rosuvastatin (CRESTOR) 5 MG tablet Take 5 mg by mouth daily.        . cyclobenzaprine (FLEXERIL) 10 MG tablet Take 1 tablet (10 mg total) by mouth 3 (three) times daily as needed for muscle spasms.  30 tablet  0  . traMADol (ULTRAM) 50 MG tablet Take 1 tablet (50 mg total) by mouth every 6 (six) hours as needed for severe pain.  30 tablet  0   No current facility-administered medications on file prior to visit.     PHYSICAL EXAMINATION:  General: The  patient is a well-nourished male, in no acute distress. Vital signs are BP 192/85  Pulse 63  Resp 18  Ht 5' 6" (1.676 m)  Wt 157 lb (71.215 kg)  BMI 25.35 kg/m2 Pulmonary: There is a good air exchange bilaterally without wheezing or rales. Abdomen: Soft and non-tender with normal pitch bowel sounds. Prominent aortic pulsation Musculoskeletal: There are no major deformities.  There is no significant extremity pain. Neurologic: No focal weakness or paresthesias are detected, Skin: There are no ulcer or rashes noted. Psychiatric: The patient has normal affect. Cardiovascular: There is a regular rate and rhythm without significant murmur appreciated. Pulse status 2+ radial and 2+ femoral pulses bilaterally  CT scan today shows 5.5 cm infrarenal abdominal aortic aneurysm. He has an adequate infrarenal neck for stent graft. He does have some ectasia of his iliac arteries but he is a stent graft candidate. He does have some atherosclerotic changes of this the femoral vessels but does have adequate vessels for delivery of the stent graft  Impression and Plan:  5.5 cm infrarenal abdominal aortic aneurysm. I discussed this with the patient. I have recommended elective stent graft repair. I explained the procedure which would be expected that one night hospitalization and Antionne Enrique return to his baseline. I explained either cutdown or most likely puncture closure of his femoral artery access. We will schedule this at his earliest in the    Teasia Zapf [facility] [place] Office: [phone]         

## 2013-08-18 NOTE — Anesthesia Postprocedure Evaluation (Signed)
  Anesthesia Post-op Note  Patient: [name] [name]  Procedure(s) Performed: Procedure(s): ABDOMINAL AORTIC ENDOVASCULAR STENT GRAFT (N/A)  Patient Location: PACU  Anesthesia Type:General  Level of Consciousness: awake and alert   Airway and Oxygen Therapy: Patient Spontanous Breathing  Post-op Pain: none  Post-op Assessment: Post-op Vital signs reviewed  Post-op Vital Signs: stable  Complications: No apparent anesthesia complications

## 2013-08-18 NOTE — Anesthesia Preprocedure Evaluation (Addendum)
Anesthesia Evaluation  Patient identified by MRN, date of birth, ID band Patient awake    Reviewed: Allergy & Precautions, H&P , NPO status , Patient's Chart, lab work & pertinent test results, reviewed documented beta blocker date and time   History of Anesthesia Complications Negative for: history of anesthetic complications  Airway Mallampati: II TM Distance: >3 FB Neck ROM: Full    Dental  (+) Edentulous Upper, Missing and Dental Advisory Given   Pulmonary former smoker,  breath sounds clear to auscultation        Cardiovascular + CAD, + CABG and + Peripheral Vascular Disease + dysrhythmias + Valvular Problems/Murmurs AS Rhythm:Regular  AVR    Neuro/Psych    GI/Hepatic negative GI ROS, Neg liver ROS,   Endo/Other  negative endocrine ROS  Renal/GU      Musculoskeletal   Abdominal (+)  Abdomen: soft.    Peds  Hematology   Anesthesia Other Findings   Reproductive/Obstetrics                        Anesthesia Physical Anesthesia Plan  ASA: IV  Anesthesia Plan: General   Post-op Pain Management:    Induction: Intravenous  Airway Management Planned: Oral ETT  Additional Equipment: Arterial line  Intra-op Plan:   Post-operative Plan: Extubation in OR  Informed Consent: I have reviewed the patients History and Physical, chart, labs and discussed the procedure including the risks, benefits and alternatives for the proposed anesthesia with the patient or authorized representative who has indicated his/her understanding and acceptance.   Dental advisory given  Plan Discussed with: CRNA and Surgeon  Anesthesia Plan Comments:        Anesthesia Quick Evaluation

## 2013-08-18 NOTE — Progress Notes (Signed)
[facility]  Patient Status: Active (receiving services up to time of hospitalization)  [facility] is providing the following services: PT and MSW  If patient discharges after hours, please call [phone].   [name] [name] 08/18/2013, 3:14 PM

## 2013-08-18 NOTE — Progress Notes (Signed)
Utilization review completed.  

## 2013-08-18 NOTE — Progress Notes (Signed)
[facility] Postoperative Visit  Subjective:  Lower back ache otherwise, no complaints  PE:  Abdomen:  Soft NT/ND Lungs:  Non labored Incisions:  Minimal bloody drainage on bandage bilateral groins-non hematoma as groins are soft. Extremities:  + audible doppler signals left DP and right peroneal Cardiac:  Regular   Assessment/Plan:  79 y/o who is s/p Gore stent graft repair of infrarenal abdominal aortic aneurysm  -pt is doing well this afternoon and resting comfortably -will mobilize later today -anticipate discharge in the morning.  Doreatha Massed 08/18/2013 1:56 PM

## 2013-08-19 LAB — BASIC METABOLIC PANEL
BUN: 11 mg/dL (ref 6–23)
CO2: 27 mEq/L (ref 19–32)
Calcium: 7.2 mg/dL — ABNORMAL LOW (ref 8.4–10.5)
Chloride: 104 mEq/L (ref 96–112)
Creatinine, Ser: 0.87 mg/dL (ref 0.50–1.35)
Glucose, Bld: 102 mg/dL — ABNORMAL HIGH (ref 70–99)
Potassium: 4 mEq/L (ref 3.5–5.1)

## 2013-08-19 LAB — CBC
HCT: 31.3 % — ABNORMAL LOW (ref 39.0–52.0)
MCH: 31.5 pg (ref 26.0–34.0)
MCV: 93.2 fL (ref 78.0–100.0)
Platelets: 96 10*3/uL — ABNORMAL LOW (ref 150–400)
RBC: 3.36 MIL/uL — ABNORMAL LOW (ref 4.22–5.81)
RDW: 14.1 % (ref 11.5–15.5)

## 2013-08-19 NOTE — Plan of Care (Signed)
Problem: Consults Goal: Diagnosis CEA/CES/AAA Stent Abdominal Aortic Aneurysm Stent (AAA)  Problem: Phase II Discharge Progression Outcomes Goal: Other Discharge Outcomes/Goals Patient is discharged home. He lives by himself, but his niece and nephew will call on him and keep tabs on him. Did instruct the patient to make sure he moves bowels within the next few days. Suggested drinking prune juice and maybe buying a over the counter laxative.

## 2013-08-19 NOTE — Progress Notes (Signed)
Patient has been discharged home today. IV's have been D/C. Have reviewed discharge instructions with the patient as well as his niece. Patient lives by himself, niece and nephew will be calling in and checking up on him. Patient has not had a BM in about 4 days, instructed the patient to drink a glass of prune juice daily til he has a BM, and suggested to niece that she pickup some form of laxative for him to use.

## 2013-08-19 NOTE — Progress Notes (Addendum)
[facility] [place]  Post-op EVAR Date of Surgery: 08/18/2013 Surgeon: Surgeon(s): Larina Earthly, MD POD: 1 Day Post-Op Device: Endoleak: No  History of Present Illness  [name] [name] is a 79 y.o. male who is s/p EVAR. The patient denies back pain; denies abdominal pain; denies lower extremity pain.  He is Ambulating and taking PO well without nausea or vomiting. Pt. has not voided with foley out  IMAGING: Dg Chest 2 View  08/18/2013   CLINICAL DATA:  Preoperative evaluation for aortic aneurysm repair  EXAM: CHEST  2 VIEW  COMPARISON:  Report of prior chest radiograph November 02, 2002 available ; images from that study are not available.  FINDINGS: There is no edema or consolidation. There is mild scarring in the left upper lobe. The heart size and pulmonary vascularity are normal. Patient is status post aortic valve replacement and coronary artery bypass grafting. There is extensive atherosclerotic change in the aorta. There is no pneumothorax. There is no adenopathy. There is degenerative change in the thoracic spine.  IMPRESSION: No edema or consolidation.   Electronically Signed   By: Bretta Bang M.D.   On: 08/18/2013 07:32   Dg Chest Portable 1 View  08/18/2013   CLINICAL DATA:  Postop stent graft placement  EXAM: PORTABLE CHEST - 1 VIEW  COMPARISON:  08/18/2013 at 0729 hr  FINDINGS: Sequelae of prior CABG and aortic valve replacement are again identified. The cardiomediastinal silhouette is within normal limits. Lung volumes are diminished compared to the prior study with mild accentuation of the central pulmonary vasculature. Minimal patchy opacity in the lung bases likely reflect subsegmental atelectasis. There is no evidence of edema or pleural effusion. No pneumothorax is identified. No acute osseous abnormality is seen.  IMPRESSION: Hypoinflation with bibasilar subsegmental atelectasis.   Electronically Signed   By: Sebastian Ache   On: 08/18/2013 11:08     Significant Diagnostic Studies: CBC Lab Results  Component Value Date   WBC 5.5 08/19/2013   HGB 10.6* 08/19/2013   HCT 31.3* 08/19/2013   MCV 93.2 08/19/2013   PLT 96* 08/19/2013     BMET    Component Value Date/Time   NA 137 08/19/2013 0430   K 4.0 08/19/2013 0430   CL 104 08/19/2013 0430   CO2 27 08/19/2013 0430   GLUCOSE 102* 08/19/2013 0430   BUN 11 08/19/2013 0430   CREATININE 0.87 08/19/2013 0430   CALCIUM 7.2* 08/19/2013 0430   GFRNONAA 79* 08/19/2013 0430   GFRAA >90 08/19/2013 0430    COAG Lab Results  Component Value Date   INR 1.23 08/18/2013   INR 1.05 08/18/2013   No results found for this basename: PTT     I/O last 3 completed shifts: In: 3140 [P.O.:340; I.V.:2750; IV Piggyback:50] Out: 3505 [Urine:3505] No data found.   Physical Examination  BP Readings from Last 3 Encounters:  08/19/13 113/51  08/19/13 113/51  08/15/13 192/85   Temp Readings from Last 3 Encounters:  08/19/13 97.3 F (36.3 C) Oral  08/19/13 97.3 F (36.3 C) Oral  07/31/13 97.6 F (36.4 C) Oral   SpO2 Readings from Last 3 Encounters:  08/19/13 97%  08/19/13 97%  08/08/13 95%   Pulse Readings from Last 3 Encounters:  08/19/13 54  08/19/13 54  08/15/13 63    General: A&O x 3, WDWN male in NAD Gait: Normal Pulmonary: normal non-labored breathing  Cardiac: RRR Abdomen: soft, NT, NABS Bilateral groin wounds: clean, dry, intact, without hematoma Vascular Exam/Pulses:Doppler  DP/PTsignals  Extremities without ischemic changes, no Gangrene , no cellulitis; no open wounds;   Neurologic: A&O X 3; Appropriate Affect  Assessment: [name] [name] is a 79 y.o. male who is 1 Day Post-Op EVAR.  Pt is doing well with no complaints  Plan: Home  The importance of surveillance of the endograft was discussed with the patient  A CTA of abdomen and pelvis will be scheduled for one month to assess for endoleak.  The patient will follow up with [place] in one month with  these studies.   SignedClinton Gallant [facility]  08/19/2013 7:12 AM.   Addendum  I have independently interviewed and examined the patient, and I agree with the physician assistant's findings.  Perc. Access site ok without hematoma.  No pulsatile AAA in abd.  Awaiting void to D/C home.  Leonides Sake, MD [facility] [place] Office: [phone] Pager: [phone]  08/19/2013, 8:52 AM

## 2013-08-21 ENCOUNTER — Telehealth: Payer: Self-pay | Admitting: Vascular Surgery

## 2013-08-21 NOTE — Discharge Summary (Signed)
[facility] Discharge Summary   Patient ID:  [name] [name] MRN: [id number] DOB/AGE: 06-01-1930 79 y.o.  Admit date: 08/18/2013 Discharge date: 08/21/2013 Date of Surgery: 08/18/2013 Surgeon: Surgeon(s): Larina Earthly, MD  Admission Diagnosis: AAA  Discharge Diagnoses:  AAA  Secondary Diagnoses: Past Medical History  Diagnosis Date  . Aortic stenosis     s/p AVR in 2004  . Spasmodic torticollis     recent with persistant headaches  . Sinus bradycardia   . Bifascicular block   . Hypercholesterolemia     On Simvastatin  . Coronary artery disease     s/p CABG in 2004  . Right bundle branch block   . Abnormal Holter monitor finding November 2012    Runs of SVT with profound bradycardia  . AAA (abdominal aortic aneurysm)     Procedure(s): ABDOMINAL AORTIC ENDOVASCULAR STENT GRAFT  Discharged Condition: good  HPI: The patient presents today for continued discussion of his infrarenal abdominal aortic aneurysm. He had been admitted to the hospital with left leg pain and back pain which was felt to be sciatica in nature. MRI in the hospital revealed a large infrarenal abdominal aortic aneurysm. This was asymptomatic. I discussed this with him in the hospital and recommended that we see him with CT angiogram for further evaluation as an outpatient. He underwent CT angiogram this morning and we are seeing him this afternoon for further discussion. He reports that he has had marked improvement in his leg discomfort. He has had cardiac clearance with a 2-D echocardiogram and Cardiolite study which showed both showed him to be low risk from a cardiac standpoint. He has no symptoms referable to his aneurysm.   He underwent Gore stent graft repair of infrarenal abdominal aortic aneurysm by Dr. Tawanna Cooler Early 08/18/2013.  He had an uneventful stay.  He was ambulating and eating well the next morning.  Incision soft without hematoma.  He independently voided prior to going home.       Hospital Course:  [name] [name] is a 79 y.o. male is S/P Neither Procedure(s): ABDOMINAL AORTIC ENDOVASCULAR STENT GRAFT Extubated: POD # 0 Physical exam: Bilateral groin wounds: clean, dry, intact, without hematoma  Vascular Exam/Pulses:Doppler DP/PTsignals  Pt. Ambulating, voiding and taking PO diet without difficulty. Pt pain controlled with PO pain meds. Labs as below Complications:none  Consults:     Significant Diagnostic Studies: CBC Lab Results  Component Value Date   WBC 5.5 08/19/2013   HGB 10.6* 08/19/2013   HCT 31.3* 08/19/2013   MCV 93.2 08/19/2013   PLT 96* 08/19/2013    BMET    Component Value Date/Time   NA 137 08/19/2013 0430   K 4.0 08/19/2013 0430   CL 104 08/19/2013 0430   CO2 27 08/19/2013 0430   GLUCOSE 102* 08/19/2013 0430   BUN 11 08/19/2013 0430   CREATININE 0.87 08/19/2013 0430   CALCIUM 7.2* 08/19/2013 0430   GFRNONAA 79* 08/19/2013 0430   GFRAA >90 08/19/2013 0430   COAG Lab Results  Component Value Date   INR 1.23 08/18/2013   INR 1.05 08/18/2013     Disposition:  Discharge to :Home Discharge Orders   Future Appointments Provider Department Dept Phone   09/19/2013 3:15 PM Larina Earthly, MD [facility] [phone]   11/27/2013 11:15 AM Cassell Clement, MD [facility] Office [phone]   Future Orders Complete By Expires   ABDOMINAL PROCEDURE/ANEURYSM REPAIR/AORTO-BIFEMORAL BYPASS:  Call MD for increased abdominal pain; cramping diarrhea;  nausea/vomiting  As directed    Call MD for:  redness, tenderness, or signs of infection (pain, swelling, bleeding, redness, odor or green/yellow discharge around incision site)  As directed    Call MD for:  severe or increased pain, loss or decreased feeling  in affected limb(s)  As directed    Call MD for:  temperature >100.5  As directed    Discharge patient  As directed    Comments:     Discharge pt to home once patient has independently  voided.   Discharge wound care:  As directed    Comments:     Shower daily with soap and water starting 08/20/13   Driving Restrictions  As directed    Comments:     No driving for 2 weeks   Lifting restrictions  As directed    Comments:     No lifting for 2 weeks   Nursing communication  As directed    Scheduling Instructions:     Please give paper Rx to patient at discharge.  It is located on the paper chart.   Resume previous diet  As directed        Medication List         amLODipine 2.5 MG tablet  Commonly known as:  NORVASC  Take 2.5-5 mg by mouth 2 (two) times daily. Takes 5mg  in morning and 2.5mg  at bedtime     aspirin EC 325 MG tablet  Take 325 mg by mouth every other day.     bimatoprost 0.03 % ophthalmic solution  Commonly known as:  LUMIGAN  Place 1 drop into both eyes at bedtime.     brimonidine 0.1 % Soln  Commonly known as:  ALPHAGAN P  Place 1 drop into both eyes every evening.     cyclobenzaprine 10 MG tablet  Commonly known as:  FLEXERIL  Take 1 tablet (10 mg total) by mouth 3 (three) times daily as needed for muscle spasms.     furosemide 80 MG tablet  Commonly known as:  LASIX  Take 80 mg by mouth daily.     guaiFENesin 600 MG 12 hr tablet  Commonly known as:  MUCINEX  Take 600 mg by mouth daily as needed for to loosen phlegm.     loratadine 10 MG tablet  Commonly known as:  CLARITIN  Take 10 mg by mouth daily as needed for allergies.     meloxicam 7.5 MG tablet  Commonly known as:  MOBIC  Take 1 tablet (7.5 mg total) by mouth daily.     oxyCODONE 5 MG immediate release tablet  Commonly known as:  ROXICODONE  Take 1 tablet (5 mg total) by mouth every 6 (six) hours as needed for severe pain.     potassium chloride SA 20 MEQ tablet  Commonly known as:  K-DUR,KLOR-CON  Take 20 mEq by mouth daily.     rosuvastatin 5 MG tablet  Commonly known as:  CRESTOR  Take 5 mg by mouth daily.     traMADol 50 MG tablet  Commonly known as:  ULTRAM   Take 1 tablet (50 mg total) by mouth every 6 (six) hours as needed for severe pain.       Verbal and written Discharge instructions given to the patient. Wound care per Discharge AVS     Follow-up Information   Follow up with EARLY, TODD, MD In 4 weeks. (Office will call you to arrange your appt (sent))    Specialty:  Vascular  Surgery   Contact information:   [address] [place] [address] [phone]      A CTA of abdomen and pelvis will be scheduled for one month to assess for endoleak.  The patient will follow up with [place] in one month with these studies.    SignedMosetta Pigeon 08/21/2013, 8:14 AM  - For VQI Registry use --- Instructions: Press F2 to tab through selections.  Delete question if not applicable.   Post-op:  Time to Extubation: [x ] In OR, [ ]  < 12 hrs, [ ]  12-24 hrs, [ ]  >=24 hrs Vasopressors Req. Post-op: No MI: [x ] No, [ ]  Troponin only, [ ]  EKG or Clinical New Arrhythmia: No CHF: No ICU Stay: 1 days Transfusion: No  If yes, 0 units given  Complications: Resp failure: [x ] none, [ ]  Pneumonia, [ ]  Ventilator Chg in renal function: [x ] none, [ ]  Inc. Cr > 0.5, [ ]  Temp. Dialysis, [ ]  Permanent dialysis Leg ischemia: x[ ]  No, [ ]  Yes, no Surgery needed, [ ]  Yes, Surgery needed, [ ]  Amputation Bowel ischemia: [ x] No, [ ]  Medical Rx, [ ]  Surgical Rx Wound complication: [x ] No, [ ]  Superficial separation/infection, [ ]  Return to OR Return to OR: No  Return to OR for bleeding: No Stroke: [x ] None, [ ]  Minor, [ ]  Major  Discharge medications: Statin use:  Yes ASA use:  Yes Plavix use:  No  for medical reason   Beta blocker use:  No  for medical reason

## 2013-08-21 NOTE — Telephone Encounter (Addendum)
Message copied by Fredrich Birks on Mon Aug 21, 2013 10:35 AM ------      Message from: Sharee Pimple      Created: Fri Aug 18, 2013  1:27 PM      Regarding: schedule                   ----- Message -----         From: Dara Lords, PA-C         Sent: 08/18/2013   1:07 PM           To: Sharee Pimple, CMA, [facility]            S/p EVAR 08/18/13.  F/u with Dr. Arbie Cookey in 4 weeks with CTA protocol.            Thanks,      Samantha ------  08/21/13: spoke with pt and mailed appt letter, dpm

## 2013-08-22 ENCOUNTER — Encounter (HOSPITAL_COMMUNITY): Payer: Self-pay | Admitting: Vascular Surgery

## 2013-08-22 ENCOUNTER — Ambulatory Visit: Payer: Medicare Other | Admitting: Cardiology

## 2013-09-18 ENCOUNTER — Encounter: Payer: Self-pay | Admitting: Vascular Surgery

## 2013-09-19 ENCOUNTER — Ambulatory Visit
Admission: RE | Admit: 2013-09-19 | Discharge: 2013-09-19 | Disposition: A | Discharge status: Home / Self Care | Payer: Medicare Other | Source: Ambulatory Visit | Attending: Vascular Surgery | Admitting: Vascular Surgery

## 2013-09-19 ENCOUNTER — Ambulatory Visit (INDEPENDENT_AMBULATORY_CARE_PROVIDER_SITE_OTHER): Payer: Self-pay | Admitting: Vascular Surgery

## 2013-09-19 ENCOUNTER — Encounter: Payer: Self-pay | Admitting: Vascular Surgery

## 2013-09-19 VITALS — BP 175/91 | HR 55 | Ht 66.0 in | Wt 155.2 lb

## 2013-09-19 DIAGNOSIS — I714 Abdominal aortic aneurysm, without rupture, unspecified: Secondary | ICD-10-CM

## 2013-09-19 DIAGNOSIS — Z48812 Encounter for surgical aftercare following surgery on the circulatory system: Secondary | ICD-10-CM

## 2013-09-19 MED ORDER — IOHEXOL 350 MG/ML SOLN
80.0000 mL | Freq: Once | INTRAVENOUS | Status: AC | PRN
  Administered 2013-09-19: 80 mL via INTRAVENOUS

## 2013-09-19 NOTE — Addendum Note (Signed)
Addended by: Mena Goes on: 09/19/2013 05:18 PM   Modules accepted: Orders

## 2013-09-19 NOTE — Progress Notes (Signed)
The patient presents today for followup of the stent graft repair of infrarenal abdominal aortic aneurysm by myself on 08/18/2013. He is well hospital discharge on postoperative day 1. He continues to do well. He has had no groin complications. He has return to his usual state of health.  On physical exam both groin puncture sites I had excellent healing with no evidence of false aneurysm. He does have palpable popliteal pulses bilaterally. Abdominal exam reveals no pulsatile mass and no tenderness  He did undergo CT scan today and I reviewed this with him. This revealed excellent positioning of the stent graft with no evidence of endoleak.  Impression and plan stable status post stent graft repair of abdominal aortic aneurysm. The patient will resume full activities without limitations. We will see him again in 6 months with repeat CT scan

## 2013-11-03 ENCOUNTER — Emergency Department (HOSPITAL_COMMUNITY): Payer: Medicare Other

## 2013-11-03 ENCOUNTER — Emergency Department (HOSPITAL_COMMUNITY)
Admission: EM | Admit: 2013-11-03 | Discharge: 2013-11-03 | Disposition: A | Discharge status: Home / Self Care | Payer: Medicare Other | Attending: Emergency Medicine | Admitting: Emergency Medicine

## 2013-11-03 ENCOUNTER — Encounter (HOSPITAL_COMMUNITY): Payer: Self-pay | Admitting: Emergency Medicine

## 2013-11-03 DIAGNOSIS — Z87891 Personal history of nicotine dependence: Secondary | ICD-10-CM | POA: Insufficient documentation

## 2013-11-03 DIAGNOSIS — G243 Spasmodic torticollis: Secondary | ICD-10-CM | POA: Insufficient documentation

## 2013-11-03 DIAGNOSIS — I4510 Unspecified right bundle-branch block: Secondary | ICD-10-CM | POA: Insufficient documentation

## 2013-11-03 DIAGNOSIS — I359 Nonrheumatic aortic valve disorder, unspecified: Secondary | ICD-10-CM | POA: Insufficient documentation

## 2013-11-03 DIAGNOSIS — S01112A Laceration without foreign body of left eyelid and periocular area, initial encounter: Secondary | ICD-10-CM

## 2013-11-03 DIAGNOSIS — E7800 Pure hypercholesterolemia, unspecified: Secondary | ICD-10-CM | POA: Insufficient documentation

## 2013-11-03 DIAGNOSIS — S40019A Contusion of unspecified shoulder, initial encounter: Secondary | ICD-10-CM | POA: Insufficient documentation

## 2013-11-03 DIAGNOSIS — I714 Abdominal aortic aneurysm, without rupture, unspecified: Secondary | ICD-10-CM | POA: Insufficient documentation

## 2013-11-03 DIAGNOSIS — Z7982 Long term (current) use of aspirin: Secondary | ICD-10-CM | POA: Insufficient documentation

## 2013-11-03 DIAGNOSIS — W19XXXA Unspecified fall, initial encounter: Secondary | ICD-10-CM

## 2013-11-03 DIAGNOSIS — Y939 Activity, unspecified: Secondary | ICD-10-CM | POA: Insufficient documentation

## 2013-11-03 DIAGNOSIS — R9431 Abnormal electrocardiogram [ECG] [EKG]: Secondary | ICD-10-CM | POA: Insufficient documentation

## 2013-11-03 DIAGNOSIS — Z79899 Other long term (current) drug therapy: Secondary | ICD-10-CM | POA: Insufficient documentation

## 2013-11-03 DIAGNOSIS — Y929 Unspecified place or not applicable: Secondary | ICD-10-CM | POA: Insufficient documentation

## 2013-11-03 DIAGNOSIS — I2510 Atherosclerotic heart disease of native coronary artery without angina pectoris: Secondary | ICD-10-CM | POA: Insufficient documentation

## 2013-11-03 DIAGNOSIS — W108XXA Fall (on) (from) other stairs and steps, initial encounter: Secondary | ICD-10-CM | POA: Insufficient documentation

## 2013-11-03 DIAGNOSIS — S0180XA Unspecified open wound of other part of head, initial encounter: Secondary | ICD-10-CM | POA: Insufficient documentation

## 2013-11-03 DIAGNOSIS — Z951 Presence of aortocoronary bypass graft: Secondary | ICD-10-CM | POA: Insufficient documentation

## 2013-11-03 DIAGNOSIS — I452 Bifascicular block: Secondary | ICD-10-CM | POA: Insufficient documentation

## 2013-11-03 DIAGNOSIS — I498 Other specified cardiac arrhythmias: Secondary | ICD-10-CM | POA: Insufficient documentation

## 2013-11-03 MED ORDER — ACETAMINOPHEN 325 MG PO TABS
650.0000 mg | ORAL_TABLET | Freq: Once | ORAL | Status: AC
  Administered 2013-11-03: 650 mg via ORAL
  Filled 2013-11-03: qty 2

## 2013-11-03 NOTE — ED Notes (Signed)
Dr. Wofford at the bedside.  

## 2013-11-03 NOTE — ED Notes (Signed)
PA student finished suturing.

## 2013-11-03 NOTE — ED Provider Notes (Signed)
CSN: [id number]     Arrival date & time 11/03/13  0714 History   First MD Initiated Contact with Patient 11/03/13 [phone]     Chief Complaint  Patient presents with  . Fall  . Shoulder Pain     (Consider location/radiation/quality/duration/timing/severity/associated sxs/prior Treatment) HPI Comments: 80 -year-old male who presents after falling by slipping on ice. He states he stepped outside slipped on ice, landed on his porch, and bounced down 3-4 stairs.  He complains of a laceration on his left eyebrow and left sided shoulder pain.  Patient is a 80 y.o. male presenting with fall and shoulder pain.  Fall This is a new problem. The current episode started 1 to 2 hours ago. Episode frequency: once. The problem has been resolved. Pertinent negatives include no chest pain, no abdominal pain, no headaches and no shortness of breath. Nothing aggravates the symptoms. Nothing relieves the symptoms.  Shoulder Pain Pertinent negatives include no chest pain, no abdominal pain, no headaches and no shortness of breath.    Past Medical History  Diagnosis Date  . Aortic stenosis     s/p AVR in 2004  . Spasmodic torticollis     recent with persistant headaches  . Sinus bradycardia   . Bifascicular block   . Hypercholesterolemia     On Simvastatin  . Coronary artery disease     s/p CABG in 2004  . Right bundle branch block   . Abnormal Holter monitor finding November 2012    Runs of SVT with profound bradycardia  . AAA (abdominal aortic aneurysm)    Past Surgical History  Procedure Laterality Date  . Coronary artery bypass graft  10-30-02    x8 per Dr. Cyndia Bent with LIMA to LAD, SVG to PD, SVG to 2nd DX/1st DX, & SVG to subbranch of the 1st DX & 1st OM  . Aortic valve replacement  10-30-02    insertion of a bioprosthetic #79mm AVR  . Abdominal aortic endovascular stent graft N/A 08/18/2013    Procedure: ABDOMINAL AORTIC ENDOVASCULAR STENT GRAFT;  Surgeon: Rosetta Posner, MD;  Location: [facility] OR;   Service: Vascular;  Laterality: N/A;   Family History  Problem Relation Age of Onset  . Hypertension Mother   . Hypertension Sister    History  Substance Use Topics  . Smoking status: Former Smoker    Types: Cigarettes    Quit date: 08/15/1953  . Smokeless tobacco: Former Systems developer    Quit date: 08/15/1954  . Alcohol Use: No    Review of Systems  Respiratory: Negative for shortness of breath.   Cardiovascular: Negative for chest pain.  Gastrointestinal: Negative for abdominal pain.  Neurological: Negative for headaches.  All other systems reviewed and are negative.      Allergies  Review of patient's allergies indicates no known allergies.  Home Medications   Current Outpatient Rx  Name  Route  Sig  Dispense  Refill  . amLODipine (NORVASC) 2.5 MG tablet   Oral   Take 2.5-5 mg by mouth 2 (two) times daily. Takes 5mg  in morning and 2.5mg  at bedtime         . aspirin EC 325 MG tablet   Oral   Take 325 mg by mouth every other day.         . bimatoprost (LUMIGAN) 0.03 % ophthalmic solution   Both Eyes   Place 1 drop into both eyes at bedtime.          . brimonidine (ALPHAGAN P) 0.1 %  SOLN   Both Eyes   Place 1 drop into both eyes 2 (two) times daily.          . Cholecalciferol (VITAMIN D PO)   Oral   Take 1 tablet by mouth daily.         . cyclobenzaprine (FLEXERIL) 10 MG tablet   Oral   Take 1 tablet (10 mg total) by mouth 3 (three) times daily as needed for muscle spasms.   30 tablet   0   . furosemide (LASIX) 80 MG tablet   Oral   Take 80 mg by mouth daily.         Marland Kitchen guaiFENesin (MUCINEX) 600 MG 12 hr tablet   Oral   Take 600 mg by mouth daily as needed for to loosen phlegm.         . loratadine (CLARITIN) 10 MG tablet   Oral   Take 10 mg by mouth daily as needed for allergies.         . potassium chloride SA (K-DUR,KLOR-CON) 20 MEQ tablet   Oral   Take 20 mEq by mouth daily.         . rosuvastatin (CRESTOR) 5 MG tablet   Oral    Take 5 mg by mouth daily.           Marland Kitchen VITAMIN E PO   Oral   Take 1 tablet by mouth daily.          BP 176/80  Pulse 77  Temp(Src) 97.9 F (36.6 C) (Oral)  Resp 20  Wt 157 lb (71.215 kg)  SpO2 99% Physical Exam  Nursing note and vitals reviewed. Constitutional: He is oriented to person, place, and time. He appears well-developed and well-nourished. No distress.  HENT:  Head: Normocephalic. Head is without raccoon's eyes and without Battle's sign.  Nose: Nose normal.  2.5 cm linear laceration through left eyebrow  Eyes: Conjunctivae and EOM are normal. Pupils are equal, round, and reactive to light. No scleral icterus.  Neck: No spinous process tenderness and no muscular tenderness present.  Cardiovascular: Normal rate, regular rhythm, normal heart sounds and intact distal pulses.   No murmur heard. Pulmonary/Chest: Effort normal and breath sounds normal. He has no rales. He exhibits no tenderness.  Abdominal: Soft. There is no tenderness. There is no rebound and no guarding.  Musculoskeletal: Normal range of motion. He exhibits no edema.       Left shoulder: He exhibits tenderness and bony tenderness. He exhibits normal range of motion, no swelling, no effusion, no crepitus, no deformity, normal pulse and normal strength.       Thoracic back: He exhibits no tenderness and no bony tenderness.       Lumbar back: He exhibits no tenderness and no bony tenderness.  No evidence of trauma to extremities, except as noted.  2+ distal pulses.    Neurological: He is alert and oriented to person, place, and time.  Skin: Skin is warm and dry. No rash noted.  Psychiatric: He has a normal mood and affect.    ED Course  LACERATION REPAIR Date/Time: 11/03/2013 5:59 PM Performed by: Serita Grit DAVID III Authorized by: Serita Grit DAVID III Consent: Verbal consent obtained. Risks and benefits: risks, benefits and alternatives were discussed Body area: head/neck Location details:  left eyebrow Laceration length: 2 cm Foreign bodies: no foreign bodies Tendon involvement: none Nerve involvement: none Vascular damage: no Anesthesia: local infiltration Local anesthetic: lidocaine 2% with epinephrine Preparation: Patient was  prepped and draped in the usual sterile fashion. Irrigation solution: saline Irrigation method: jet lavage Amount of cleaning: standard Debridement: none Degree of undermining: none Skin closure: 6-0 Prolene Number of sutures: 3 Technique: simple Approximation: close Approximation difficulty: simple Patient tolerance: Patient tolerated the procedure well with no immediate complications. Comments: Repair performed by PA student, supervised by me.    (including critical care time) Labs Review Labs Reviewed - No data to display Imaging Review Dg Chest 2 View  11/03/2013   CLINICAL DATA:  [place] today with left posterior shoulder pain, history of aortic stenosis  EXAM: CHEST  2 VIEW  COMPARISON:  Chest x-ray of 08/18/2013  FINDINGS: No active infiltrate or effusion is seen. Mediastinal contours appear stable. Mild cardiomegaly is stable. Median sternotomy sutures are noted from prior CABG and aortic valve repair. and aortoiliac stent also is noted.  IMPRESSION: Stable chest x-ray with mild cardiomegaly.  No active lung disease   Electronically Signed   By: Ivar Drape M.D.   On: 11/03/2013 09:23   Ct Head Wo Contrast  11/03/2013   CLINICAL DATA:  Head injury status post fall  EXAM: CT HEAD WITHOUT CONTRAST  CT CERVICAL SPINE WITHOUT CONTRAST  TECHNIQUE: Multidetector CT imaging of the head and cervical spine was performed following the standard protocol without intravenous contrast. Multiplanar CT image reconstructions of the cervical spine were also generated.  COMPARISON:  None.  FINDINGS: CT HEAD FINDINGS  No acute intracranial abnormality. Specifically, no hemorrhage, hydrocephalus, mass lesion, acute infarction, or significant intracranial injury.  No acute calvarial abnormality. Global atrophy and diffuse areas of low attenuation within the subcortical, deep, and periventricular white matter regions. A punctate focal area of low attenuation projects within the posterior aspect of the left frontal lobe. Focal punctate area of low attenuation projects within the posterior aspect of the internal capsule region on the right. There is diffuse thickening of the osseous structures of the maxillary sinuses as well as areas of mucosal thickening within the mastoid sinuses and ethmoid air cells. Postsurgical changes also identified within the maxillary sinuses. The mastoid air cells are patent.  CT CERVICAL SPINE FINDINGS  There is no evidence of acute fracture, dislocation, nor malalignment. An oblique lucency projects along the base of the posterior ring of C2 on the right. 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 There is no evidence of canal stenosis. Areas of disc space narrowing are appreciated throughout the cervical spine most severe at the C for 5, C5-6, C6-7 levels. There also areas of endplate sclerosis at these levels and peripheral hypertrophic spurring. Degenerative disc disease changes are appreciated at the C7-T1 level. Multilevel facet hypertrophy and endplate sclerosis is appreciated primarily within the lower cervical spine left greater than right. The paraspinous soft tissues are grossly unremarkable. Coarse atherosclerotic calcifications are identified within the carotid vessels.  IMPRESSION: 1. Involutional and chronic changes without evidence of focal or acute intracranial abnormalities. 2. Multilevel spondylosis within the cervical spine without evidence of acute osseous abnormalities. Atherosclerotic disease is appreciated within the carotid vessels. 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   Electronically Signed   By: Margaree Mackintosh M.D.   On: 11/03/2013 09:24   Ct Cervical Spine Wo Contrast  11/03/2013   CLINICAL DATA:  Head injury status post  fall  EXAM: CT HEAD WITHOUT CONTRAST  CT CERVICAL SPINE WITHOUT CONTRAST  TECHNIQUE: Multidetector CT imaging of the head and cervical spine was performed following the standard protocol without intravenous contrast. Multiplanar CT image reconstructions of the cervical spine were also generated.  COMPARISON:  None.  FINDINGS: CT HEAD FINDINGS  No acute intracranial abnormality. Specifically, no hemorrhage, hydrocephalus, mass lesion, acute infarction, or significant intracranial injury. No acute calvarial abnormality. Global atrophy and diffuse areas of low attenuation within the subcortical, deep, and periventricular white matter regions. A punctate focal area of low attenuation projects within the posterior aspect of the left frontal lobe. Focal punctate area of low attenuation projects within the posterior aspect of the internal capsule region on the right. There is diffuse thickening of the osseous structures of the maxillary sinuses as well as areas of mucosal thickening within the mastoid sinuses and ethmoid air cells. Postsurgical changes also identified within the maxillary sinuses. The mastoid air cells are patent.  CT CERVICAL SPINE FINDINGS  There is no evidence of acute fracture, dislocation, nor malalignment. An oblique lucency projects along the base of the posterior ring of C2 on the right. 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 There is no evidence of canal stenosis. Areas of disc space narrowing are appreciated throughout the cervical spine most severe at the C for 5, C5-6, C6-7 levels. There also areas of endplate sclerosis at these levels and peripheral hypertrophic spurring. Degenerative disc disease  changes are appreciated at the C7-T1 level. Multilevel facet hypertrophy and endplate sclerosis is appreciated primarily within the lower cervical spine left greater than right. The paraspinous soft tissues are grossly unremarkable. Coarse atherosclerotic calcifications are identified within the carotid vessels.  IMPRESSION: 1. Involutional and chronic changes without evidence of focal or acute intracranial abnormalities. 2. Multilevel spondylosis within the cervical spine without evidence of acute osseous abnormalities. Atherosclerotic disease is appreciated within the carotid vessels. 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   Electronically Signed   By: Margaree Mackintosh M.D.   On: 11/03/2013 09:24   Dg Shoulder Left  11/03/2013   CLINICAL DATA:  [place] today with posterior left shoulder pain  EXAM: LEFT SHOULDER - 2+ VIEW  COMPARISON:  None.  FINDINGS: The left humeral head is in normal position and the left glenohumeral joint space is unremarkable. There is degenerative spurring from the acromion and the [facility] joint which may result in impingement. The ribs that are visualized appear intact.  IMPRESSION: Degenerative change may result in impingement. However no fracture is seen.   Electronically Signed   By: Ivar Drape M.D.   On: 11/03/2013 09:24  All radiology studies independently viewed by me.     EKG Interpretation  None  MDM   Final diagnoses:  Fall  Shoulder contusion  Laceration of left eyebrow    80 year old male with mechanical fall.  Only apparent injuries are an eyebrow laceration and left shoulder pain. No other injuries based on history or exam. Plain CT head and neck, plain films of chest and shoulder. Lac will need to be repaired.  Lac repaired.  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  He has no neck pain or tenderness, no pain with ROM.  Will give  number for outpatient follow up.  Houston Siren III, MD 11/03/13 1800

## 2013-11-03 NOTE — Discharge Instructions (Signed)
Fall Prevention and Home Safety Falls cause injuries and can affect all age groups. It is possible to use preventive measures to significantly decrease the likelihood of falls. There are many simple measures which can make your home safer and prevent falls. OUTDOORS  Repair cracks and edges of walkways and driveways.  Remove high doorway thresholds.  Trim shrubbery on the main path into your home.  Have good outside lighting.  Clear walkways of tools, rocks, debris, and clutter.  Check that handrails are not broken and are securely fastened. Both sides of steps should have handrails.  Have leaves, snow, and ice cleared regularly.  Use sand or salt on walkways during winter months.  In the garage, clean up grease or oil spills. BATHROOM  Install night lights.  Install grab bars by the toilet and in the tub and shower.  Use non-skid mats or decals in the tub or shower.  Place a plastic non-slip stool in the shower to sit on, if needed.  Keep floors dry and clean up all water on the floor immediately.  Remove soap buildup in the tub or shower on a regular basis.  Secure bath mats with non-slip, double-sided rug tape.  Remove throw rugs and tripping hazards from the floors. BEDROOMS  Install night lights.  Make sure a bedside light is easy to reach.  Do not use oversized bedding.  Keep a telephone by your bedside.  Have a firm chair with side arms to use for getting dressed.  Remove throw rugs and tripping hazards from the floor. KITCHEN  Keep handles on pots and pans turned toward the center of the stove. Use back burners when possible.  Clean up spills quickly and allow time for drying.  Avoid walking on wet floors.  Avoid hot utensils and knives.  Position shelves so they are not too high or low.  Place commonly used objects within easy reach.  If necessary, use a sturdy step stool with a grab bar when reaching.  Keep electrical cables out of the  way.  Do not use floor polish or wax that makes floors slippery. If you must use wax, use non-skid floor wax.  Remove throw rugs and tripping hazards from the floor. STAIRWAYS  Never leave objects on stairs.  Place handrails on both sides of stairways and use them. Fix any loose handrails. Make sure handrails on both sides of the stairways are as long as the stairs.  Check carpeting to make sure it is firmly attached along stairs. Make repairs to worn or loose carpet promptly.  Avoid placing throw rugs at the top or bottom of stairways, or properly secure the rug with carpet tape to prevent slippage. Get rid of throw rugs, if possible.  Have an electrician put in a light switch at the top and bottom of the stairs. OTHER FALL PREVENTION TIPS  Wear low-heel or rubber-soled shoes that are supportive and fit well. Wear closed toe shoes.  When using a stepladder, make sure it is fully opened and both spreaders are firmly locked. Do not climb a closed stepladder.  Add color or contrast paint or tape to grab bars and handrails in your home. Place contrasting color strips on first and last steps.  Learn and use mobility aids as needed. Install an electrical emergency response system.  Turn on lights to avoid dark areas. Replace light bulbs that burn out immediately. Get light switches that glow.  Arrange furniture to create clear pathways. Keep furniture in the same place.  Firmly attach carpet with non-skid or double-sided tape.  Eliminate uneven floor surfaces.  Select a carpet pattern that does not visually hide the edge of steps.  Be aware of all pets. OTHER HOME SAFETY TIPS  Set the water temperature for 120 F (48.8 C).  Keep emergency numbers on or near the telephone.  Keep smoke detectors on every level of the home and near sleeping areas. Document Released: 08/14/2002 Document Revised: 02/23/2012 Document Reviewed: 11/13/2011 [facility] Patient Information 2014  [place].  Facial Laceration  A facial laceration is a cut on the face. These injuries can be painful and cause bleeding. Lacerations usually heal quickly, but they need special care to reduce scarring. DIAGNOSIS  Your health care provider will take a medical history, ask for details about how the injury occurred, and examine the wound to determine how deep the cut is. TREATMENT  Some facial lacerations may not require closure. Others may not be able to be closed because of an increased risk of infection. The risk of infection and the chance for successful closure will depend on various factors, including the amount of time since the injury occurred. The wound may be cleaned to help prevent infection. If closure is appropriate, pain medicines may be given if needed. Your health care provider will use stitches (sutures), wound glue (adhesive), or skin adhesive strips to repair the laceration. These tools bring the skin edges together to allow for faster healing and a better cosmetic outcome. If needed, you may also be given a tetanus shot. HOME CARE INSTRUCTIONS  Only take over-the-counter or prescription medicines as directed by your health care provider.  Follow your health care provider's instructions for wound care. These instructions will vary depending on the technique used for closing the wound. For Sutures:  Keep the wound clean and dry.   If you were given a bandage (dressing), you should change it at least once a day. Also change the dressing if it becomes wet or dirty, or as directed by your health care provider.   Wash the wound with soap and water 2 times a day. Rinse the wound off with water to remove all soap. Pat the wound dry with a clean towel.   After cleaning, apply a thin layer of the antibiotic ointment recommended by your health care provider. This will help prevent infection and keep the dressing from sticking.   You may shower as usual after the first 24  hours. Do not soak the wound in water until the sutures are removed.   Get your sutures removed as directed by your health care provider. With facial lacerations, sutures should usually be taken out after 4 5 days to avoid stitch marks.   Wait a few days after your sutures are removed before applying any makeup. For Skin Adhesive Strips:  Keep the wound clean and dry.   Do not get the skin adhesive strips wet. You may bathe carefully, using caution to keep the wound dry.   If the wound gets wet, pat it dry with a clean towel.   Skin adhesive strips will fall off on their own. You may trim the strips as the wound heals. Do not remove skin adhesive strips that are still stuck to the wound. They will fall off in time.  For Wound Adhesive:  You may briefly wet your wound in the shower or bath. Do not soak or scrub the wound. Do not swim. Avoid periods of heavy sweating until the skin adhesive has fallen  off on its own. After showering or bathing, gently pat the wound dry with a clean towel.   Do not apply liquid medicine, cream medicine, ointment medicine, or makeup to your wound while the skin adhesive is in place. This may loosen the film before your wound is healed.   If a dressing is placed over the wound, be careful not to apply tape directly over the skin adhesive. This may cause the adhesive to be pulled off before the wound is healed.   Avoid prolonged exposure to sunlight or tanning lamps while the skin adhesive is in place.  The skin adhesive will usually remain in place for 5 10 days, then naturally fall off the skin. Do not pick at the adhesive film.  After Healing: Once the wound has healed, cover the wound with sunscreen during the day for 1 full year. This can help minimize scarring. Exposure to ultraviolet light in the first year will darken the scar. It can take 1 2 years for the scar to lose its redness and to heal completely.  SEEK IMMEDIATE MEDICAL CARE IF:  You  have redness, pain, or swelling around the wound.   You see ayellowish-white fluid (pus) coming from the wound.   You have chills or a fever.  MAKE SURE YOU:  Understand these instructions.  Will watch your condition.  Will get help right away if you are not doing well or get worse. Document Released: 10/01/2004 Document Revised: 06/14/2013 Document Reviewed: 04/06/2013 [facility] Patient Information 2014 [place], [place].

## 2013-11-03 NOTE — ED Notes (Signed)
Ambulated in hallway without any difficulty

## 2013-11-03 NOTE — ED Notes (Signed)
Pt presents via POV from home after falling this am down 4 stairs.  Pt states his feet slipped on the ice and he fell backwards hitting his head and Left shoulder.  Pt presents with a approx. 2.5cm laceration in the left eyebrow.  Pt is also complaining of Left shoulder pain, no deformity, tender to the touch.

## 2013-11-13 ENCOUNTER — Other Ambulatory Visit: Payer: Self-pay | Admitting: Geriatric Medicine

## 2013-11-13 ENCOUNTER — Ambulatory Visit
Admission: RE | Admit: 2013-11-13 | Discharge: 2013-11-13 | Disposition: A | Discharge status: Home / Self Care | Payer: Medicare Other | Source: Ambulatory Visit | Attending: Geriatric Medicine | Admitting: Geriatric Medicine

## 2013-11-13 DIAGNOSIS — M25579 Pain in unspecified ankle and joints of unspecified foot: Secondary | ICD-10-CM

## 2013-11-27 ENCOUNTER — Encounter: Payer: Self-pay | Admitting: Cardiology

## 2013-11-27 ENCOUNTER — Ambulatory Visit (INDEPENDENT_AMBULATORY_CARE_PROVIDER_SITE_OTHER): Payer: Medicare Other | Admitting: Cardiology

## 2013-11-27 VITALS — BP 159/74 | HR 59 | Ht 66.0 in | Wt 158.0 lb

## 2013-11-27 DIAGNOSIS — Z952 Presence of prosthetic heart valve: Secondary | ICD-10-CM

## 2013-11-27 DIAGNOSIS — Z79899 Other long term (current) drug therapy: Secondary | ICD-10-CM

## 2013-11-27 DIAGNOSIS — I259 Chronic ischemic heart disease, unspecified: Secondary | ICD-10-CM

## 2013-11-27 DIAGNOSIS — Z953 Presence of xenogenic heart valve: Secondary | ICD-10-CM

## 2013-11-27 DIAGNOSIS — I119 Hypertensive heart disease without heart failure: Secondary | ICD-10-CM

## 2013-11-27 LAB — BASIC METABOLIC PANEL
BUN: 16 mg/dL (ref 6–23)
CALCIUM: 9.4 mg/dL (ref 8.4–10.5)
CO2: 32 mEq/L (ref 19–32)
Chloride: 98 mEq/L (ref 96–112)
Creatinine, Ser: 0.9 mg/dL (ref 0.4–1.5)
GFR: 82.69 mL/min (ref >60.00)
Glucose, Bld: 115 mg/dL — ABNORMAL HIGH (ref 70–99)
Potassium: 3.8 mEq/L (ref 3.5–5.1)
SODIUM: 138 meq/L (ref 135–145)

## 2013-11-27 MED ORDER — LISINOPRIL 5 MG PO TABS: 5.0000 mg | ORAL_TABLET | Freq: Every day | ORAL | Status: DC

## 2013-11-27 NOTE — Progress Notes (Signed)
Quick Note:  Please report to patient. The recent labs are stable. Continue same medication and careful diet. ______ 

## 2013-11-27 NOTE — Assessment & Plan Note (Signed)
The patient is not having any symptoms from his bioprosthetic aortic valve.  No CHF.

## 2013-11-27 NOTE — Progress Notes (Signed)
[name] [name] Date of Birth:  04-21-1930 [address] [place], [place]  [address] [phone]         Fax   [phone]  History of Present Illness: This pleasant 80 year old gentleman is seen for a followup office visit.  He was seen in November 2014 in [facility] emergency room for back pain.  An MRI revealed a previously unknown large abdominal aortic aneurysm measuring 5.4 x 5.3 cm. he underwent preoperative risk assessment with echocardiogram and nuclear stress test.  His echocardiogram on 08/09/13 showed good bioprosthetic aortic valve function and his ejection fraction was 60-65%.  His Lexi scan Myoview stress test showed ejection fraction of 58% and there was a moderate sized old inferior wall scar with minimal peri-infarct ischemia.  He went on to have a successful treatment as is 5.5 cm abdominal aortic aneurysm by Dr. Sherren Mocha early on 08/18/13 treated with endovascular stent graft. He has a history of previous aortic stenosis. In 2004 he underwent surgery for aortic valve stenosis with insertion of a bioprosthetic valve. At the same time he underwent coronary artery bypass graft surgery x5 .The patient has done well postoperatively. The patient has a history of marked chronic intrinsic sinus bradycardia with bifascicular block. He has not had complete heart block. He is not having any symptoms from his bradycardia. His last visit he has continued to do well. He denies any chest pain. He's had no dizziness or syncope despite his chronic bradycardia. He remains physically active.   His last EKG done in the emergency room on 07/29/13 showed normal sinus rhythm with bifascicular block.  Current Outpatient Prescriptions  Medication Sig Dispense Refill  . amLODipine (NORVASC) 2.5 MG tablet Take 2.5-5 mg by mouth 2 (two) times daily. Takes 5mg  in morning and 2.5mg  at bedtime      . aspirin EC 325 MG tablet Take 325 mg by mouth every other day.      . bimatoprost  (LUMIGAN) 0.03 % ophthalmic solution Place 1 drop into both eyes at bedtime.       . brimonidine (ALPHAGAN P) 0.1 % SOLN Place 1 drop into both eyes 2 (two) times daily.       . Cholecalciferol (VITAMIN D PO) Take 1 tablet by mouth daily.      . cyclobenzaprine (FLEXERIL) 10 MG tablet Take 1 tablet (10 mg total) by mouth 3 (three) times daily as needed for muscle spasms.  30 tablet  0  . furosemide (LASIX) 80 MG tablet Take 80 mg by mouth daily.      Marland Kitchen guaiFENesin (MUCINEX) 600 MG 12 hr tablet Take 600 mg by mouth daily as needed for to loosen phlegm.      . loratadine (CLARITIN) 10 MG tablet Take 10 mg by mouth daily as needed for allergies.      . meloxicam (MOBIC) 7.5 MG tablet Take 7.5 mg by mouth daily.      . potassium chloride SA (K-DUR,KLOR-CON) 20 MEQ tablet Take 20 mEq by mouth daily.      . rosuvastatin (CRESTOR) 5 MG tablet Take 5 mg by mouth daily.        . traMADol (ULTRAM) 50 MG tablet Take by mouth every 6 (six) hours as needed.      Marland Kitchen VITAMIN E PO Take 1 tablet by mouth daily.      Marland Kitchen lisinopril (PRINIVIL,ZESTRIL) 5 MG tablet Take 1 tablet (5 mg total) by mouth daily.  30 tablet  5  No current facility-administered medications for this visit.    No Known Allergies  Patient Active Problem List   Diagnosis Date Noted  . AAA (abdominal aortic aneurysm) 08/18/2013  . Abdominal aneurysm without mention of rupture 08/15/2013  . Abdominal aortic aneurysm 08/02/2013  . Left leg pain 07/30/2013  . Back pain 07/30/2013  . S/P aortic valve replacement with bioprosthetic valve 09/11/2011  . Hx of CABG 09/11/2011  . Dizziness 08/05/2011  . Sinus bradycardia 05/06/2011  . Ischemic heart disease 05/06/2011  . Benign hypertensive heart disease without heart failure 05/06/2011    History  Smoking status  . Former Smoker  . Types: Cigarettes  . Quit date: 08/15/1953  Smokeless tobacco  . Former Systems developer  . Quit date: 08/15/1954    History  Alcohol Use No    Family History   Problem Relation Age of Onset  . Hypertension Mother   . Hypertension Sister     Review of Systems: Constitutional: no fever chills diaphoresis or fatigue or change in weight.  Head and neck: no hearing loss, no epistaxis, no photophobia or visual disturbance. Respiratory: No cough, shortness of breath or wheezing. Cardiovascular: No chest pain peripheral edema, palpitations. Gastrointestinal: No abdominal distention, no abdominal pain, no change in bowel habits hematochezia or melena. Genitourinary: No dysuria, no frequency, no urgency, no nocturia. Musculoskeletal:No arthralgias, no back pain, no gait disturbance or myalgias. Neurological: No dizziness, no headaches, no numbness, no seizures, no syncope, no weakness, no tremors. Hematologic: No lymphadenopathy, no easy bruising. Psychiatric: No confusion, no hallucinations, no sleep disturbance.    Physical Exam: Filed Vitals:   11/27/13 1051  BP: 159/74  Pulse: 59   the general appearance reveals a well-developed well-nourished elderly gentleman in no distress.The head and neck exam reveals pupils equal and reactive.  Extraocular movements are full.  There is no scleral icterus.  The mouth and pharynx are normal.  The neck is supple.  The carotids reveal no bruits.  The jugular venous pressure is normal.  The  thyroid is not enlarged.  There is no lymphadenopathy.  The chest is clear to percussion and auscultation.  There are no rales or rhonchi.  Expansion of the chest is symmetrical.  The precordium is quiet.  The first heart sound is normal.  The second heart sound is physiologically split.  There is a soft systolic ejection murmur across the prosthetic aortic valve.  There is no diastolic murmur..  There is no abnormal lift or heave.  The abdomen is soft and nontender.  The bowel sounds are normal.  The liver and spleen are not enlarged.  There are no abdominal masses.  There are no abdominal bruits.  Extremities reveal good pedal  pulses.  There is no phlebitis or edema.  There is no cyanosis or clubbing.  Strength is normal and symmetrical in all extremities.  There is no lateralizing weakness.  There are no sensory deficits.  The skin is warm and dry.  There is no rash.     Assessment / Plan: Continue same medication.  Start lisinopril 5 mg one daily.  Check a baseline basal metabolic panel today and another one in 7-10 days after starting lisinopril.  Recheck in 4 months for followup office visit and EKG.

## 2013-11-27 NOTE — Assessment & Plan Note (Signed)
Blood pressure today is high and we are going to start him on lisinopril 5 mg one daily.  Previously his renal function has been normal.  We are checking a basal metabolic panel today.

## 2013-11-27 NOTE — Patient Instructions (Signed)
Will obtain labs today and call you with the results (bmet)  ADD LISINOPRIL 5 MG DIALY, RX SENT TO [facility]  Return December 06, 2013 for labs only (ok to eat and drink)  Your physician wants you to follow-up in: 4 month ov/ekg  You will receive a reminder letter in the mail two months in advance. If you don't receive a letter, please call our office to schedule the follow-up appointment.

## 2013-11-27 NOTE — Assessment & Plan Note (Signed)
The patient has not been having any chest pain to suggest angina.  He took a bad fall from his porch because of the ice and has extensive ecchymoses and bruising over his left anterior chest.  He was checked out at the hospital and fortunately did not break any bones.  He also injured his left foot and ankle which is still swollen and mildly ecchymotic

## 2013-12-06 ENCOUNTER — Telehealth: Payer: Self-pay | Admitting: *Deleted

## 2013-12-06 ENCOUNTER — Other Ambulatory Visit (INDEPENDENT_AMBULATORY_CARE_PROVIDER_SITE_OTHER): Payer: Medicare Other

## 2013-12-06 DIAGNOSIS — Z951 Presence of aortocoronary bypass graft: Secondary | ICD-10-CM

## 2013-12-06 DIAGNOSIS — E7800 Pure hypercholesterolemia, unspecified: Secondary | ICD-10-CM

## 2013-12-06 DIAGNOSIS — Z79899 Other long term (current) drug therapy: Secondary | ICD-10-CM

## 2013-12-06 LAB — HEPATIC FUNCTION PANEL
ALT: 14 U/L (ref 0–53)
AST: 20 U/L (ref 0–37)
Albumin: 4.1 g/dL (ref 3.5–5.2)
Alkaline Phosphatase: 68 U/L (ref 39–117)
BILIRUBIN DIRECT: 0 mg/dL (ref 0.0–0.3)
BILIRUBIN TOTAL: 0.6 mg/dL (ref 0.3–1.2)
Total Protein: 6.5 g/dL (ref 6.0–8.3)

## 2013-12-06 LAB — BASIC METABOLIC PANEL
BUN: 20 mg/dL (ref 6–23)
CHLORIDE: 97 meq/L (ref 96–112)
CO2: 32 mEq/L (ref 19–32)
CREATININE: 1 mg/dL (ref 0.4–1.5)
Calcium: 8.9 mg/dL (ref 8.4–10.5)
GFR: 74.33 mL/min (ref >60.00)
Glucose, Bld: 143 mg/dL — ABNORMAL HIGH (ref 70–99)
Potassium: 3.8 mEq/L (ref 3.5–5.1)
Sodium: 137 mEq/L (ref 135–145)

## 2013-12-06 NOTE — Progress Notes (Signed)
Quick Note:  Please report to patient. The recent labs are stable. Continue same medication and careful diet. BS higher. Watch carbs. ______

## 2013-12-06 NOTE — Telephone Encounter (Signed)
Advised patient of lab results  

## 2013-12-06 NOTE — Telephone Encounter (Signed)
Message copied by Earvin Hansen on Wed Dec 06, 2013  5:29 PM ------      Message from: Darlin Coco      Created: Wed Dec 06, 2013  4:20 PM       Please report to patient.  The recent labs are stable. Continue same medication and careful diet.  BS higher.  Watch carbs. ------

## 2013-12-08 ENCOUNTER — Other Ambulatory Visit: Payer: Self-pay | Admitting: Cardiology

## 2014-01-10 ENCOUNTER — Other Ambulatory Visit: Payer: Self-pay | Admitting: Cardiology

## 2014-03-19 ENCOUNTER — Other Ambulatory Visit: Payer: Self-pay | Admitting: Vascular Surgery

## 2014-03-19 LAB — CREATININE, SERUM: CREATININE: 0.9 mg/dL (ref 0.50–1.35)

## 2014-03-19 LAB — BUN: BUN: 13 mg/dL (ref 6–23)

## 2014-03-20 ENCOUNTER — Ambulatory Visit: Payer: Medicare Other | Admitting: Vascular Surgery

## 2014-03-20 ENCOUNTER — Ambulatory Visit
Admission: RE | Admit: 2014-03-20 | Discharge: 2014-03-20 | Disposition: A | Discharge status: Home / Self Care | Payer: Medicare Other | Source: Ambulatory Visit | Attending: Vascular Surgery | Admitting: Vascular Surgery

## 2014-03-20 DIAGNOSIS — I714 Abdominal aortic aneurysm, without rupture, unspecified: Secondary | ICD-10-CM

## 2014-03-20 MED ORDER — IOHEXOL 350 MG/ML SOLN
80.0000 mL | Freq: Once | INTRAVENOUS | Status: AC | PRN
  Administered 2014-03-20: 80 mL via INTRAVENOUS

## 2014-03-28 ENCOUNTER — Encounter: Payer: Self-pay | Admitting: Cardiology

## 2014-03-28 ENCOUNTER — Ambulatory Visit (INDEPENDENT_AMBULATORY_CARE_PROVIDER_SITE_OTHER): Payer: Medicare Other | Admitting: Cardiology

## 2014-03-28 VITALS — BP 123/59 | HR 49 | Ht 66.0 in | Wt 157.0 lb

## 2014-03-28 DIAGNOSIS — Z952 Presence of prosthetic heart valve: Secondary | ICD-10-CM

## 2014-03-28 DIAGNOSIS — Z951 Presence of aortocoronary bypass graft: Secondary | ICD-10-CM

## 2014-03-28 DIAGNOSIS — R5383 Other fatigue: Principal | ICD-10-CM

## 2014-03-28 DIAGNOSIS — Z953 Presence of xenogenic heart valve: Secondary | ICD-10-CM

## 2014-03-28 DIAGNOSIS — I498 Other specified cardiac arrhythmias: Secondary | ICD-10-CM

## 2014-03-28 DIAGNOSIS — R5381 Other malaise: Secondary | ICD-10-CM | POA: Insufficient documentation

## 2014-03-28 DIAGNOSIS — R001 Bradycardia, unspecified: Secondary | ICD-10-CM

## 2014-03-28 LAB — BASIC METABOLIC PANEL
BUN: 17 mg/dL (ref 6–23)
CHLORIDE: 100 meq/L (ref 96–112)
CO2: 28 mEq/L (ref 19–32)
Calcium: 9 mg/dL (ref 8.4–10.5)
Creatinine, Ser: 1.1 mg/dL (ref 0.4–1.5)
GFR: 70.28 mL/min (ref >60.00)
Glucose, Bld: 112 mg/dL — ABNORMAL HIGH (ref 70–99)
POTASSIUM: 3.9 meq/L (ref 3.5–5.1)
SODIUM: 137 meq/L (ref 135–145)

## 2014-03-28 LAB — CBC WITH DIFFERENTIAL/PLATELET
Basophils Absolute: 0 10*3/uL (ref 0.0–0.1)
Basophils Relative: 0.8 % (ref 0.0–3.0)
EOS PCT: 2.5 % (ref 0.0–5.0)
Eosinophils Absolute: 0.1 10*3/uL (ref 0.0–0.7)
HEMATOCRIT: 37.6 % — AB (ref 39.0–52.0)
HEMOGLOBIN: 12.6 g/dL — AB (ref 13.0–17.0)
LYMPHS ABS: 1 10*3/uL (ref 0.7–4.0)
Lymphocytes Relative: 17.3 % (ref 12.0–46.0)
MCHC: 33.6 g/dL (ref 30.0–36.0)
MCV: 94.2 fl (ref 78.0–100.0)
MONO ABS: 0.4 10*3/uL (ref 0.1–1.0)
Monocytes Relative: 7.4 % (ref 3.0–12.0)
NEUTROS ABS: 4.1 10*3/uL (ref 1.4–7.7)
Neutrophils Relative %: 72 % (ref 43.0–77.0)
Platelets: 182 10*3/uL (ref 150.0–400.0)
RBC: 4 Mil/uL — AB (ref 4.22–5.81)
RDW: 14.7 % (ref 11.5–15.5)
WBC: 5.7 10*3/uL (ref 4.0–10.5)

## 2014-03-28 LAB — TSH: TSH: 1.4 u[IU]/mL (ref 0.35–4.50)

## 2014-03-28 NOTE — Assessment & Plan Note (Signed)
The patient is not having any symptoms of shortness of breath or congestive heart failure or peripheral edema.  No orthopnea or paroxysmal nocturnal dyspnea.

## 2014-03-28 NOTE — Assessment & Plan Note (Signed)
The patient has marked sinus bradycardia.  He has occasional mild dizziness.  No syncope.

## 2014-03-28 NOTE — Patient Instructions (Addendum)
Will obtain labs today and call you with the results (BMET/TSH/CBC)  DECREASE YOU ASPIRIN TO 81 MG EVERY OTHER DAY  OK TO USE TYLENOL AS NEEDED FOR LEG PAIN  Your physician recommends that you schedule a follow-up appointment in: 4 month ov/ekg/bmet

## 2014-03-28 NOTE — Progress Notes (Signed)
[name] [name] Date of Birth:  02-12-1930 [place] [address] [place], [place]  [address] [phone]        Fax   [phone]   History of Present Illness: This pleasant 80 year old gentleman is seen for a followup office visit. He was seen in November 2014 in [facility] emergency room for back pain. An MRI revealed a previously unknown large abdominal aortic aneurysm measuring 5.4 x 5.3 cm. he underwent preoperative risk assessment with echocardiogram and nuclear stress test. His echocardiogram on 08/09/13 showed good bioprosthetic aortic valve function and his ejection fraction was 60-65%. His Lexi scan Myoview stress test showed ejection fraction of 58% and there was a moderate sized old inferior wall scar with minimal peri-infarct ischemia. He went on to have a successful treatment as is 5.5 cm abdominal aortic aneurysm by Dr. Sherren Mocha early on 08/18/13 treated with endovascular stent graft.  He has a history of previous aortic stenosis. In 2004 he underwent surgery for aortic valve stenosis with insertion of a bioprosthetic valve. At the same time he underwent coronary artery bypass graft surgery x5 .The patient has done well postoperatively. The patient has a history of marked chronic intrinsic sinus bradycardia with bifascicular block. He has not had complete heart block.  He has had occasional mild dizziness.  No syncope. His last visit he has continued to do well. He denies any chest pain.  He remains physically active.  Since last visit he has been having some arthritic complaints in his left leg. He has noted some easy bruising.  He has been on full dose aspirin every other day..   Current Outpatient Prescriptions  Medication Sig Dispense Refill  . acetaminophen (TYLENOL) 325 MG tablet Take 325 mg by mouth every 6 (six) hours as needed.      Marland Kitchen amLODipine (NORVASC) 2.5 MG tablet Take 2.5-5 mg by mouth 2 (two) times daily. Takes 5mg  in morning and 2.5mg   at bedtime      . aspirin 81 MG tablet Take 81 mg by mouth every other day.      . bimatoprost (LUMIGAN) 0.03 % ophthalmic solution Place 1 drop into both eyes at bedtime.       . brimonidine (ALPHAGAN P) 0.1 % SOLN Place 1 drop into both eyes 2 (two) times daily.       . Cholecalciferol (VITAMIN D PO) Take 1 tablet by mouth daily.      . cyclobenzaprine (FLEXERIL) 10 MG tablet Take 1 tablet (10 mg total) by mouth 3 (three) times daily as needed for muscle spasms.  30 tablet  0  . furosemide (LASIX) 80 MG tablet TAKE 1 TABLET BY MOUTH EVERY DAY  30 tablet  6  . guaiFENesin (MUCINEX) 600 MG 12 hr tablet Take 600 mg by mouth daily as needed for to loosen phlegm.      Marland Kitchen KLOR-CON M20 20 MEQ tablet TAKE 1 TABLET (20 MEQ TOTAL) BY MOUTH DAILY.  30 tablet  5  . lisinopril (PRINIVIL,ZESTRIL) 5 MG tablet Take 1 tablet (5 mg total) by mouth daily.  30 tablet  5  . loratadine (CLARITIN) 10 MG tablet Take 10 mg by mouth daily as needed for allergies.      . meloxicam (MOBIC) 7.5 MG tablet Take 7.5 mg by mouth daily.      . potassium chloride SA (K-DUR,KLOR-CON) 20 MEQ tablet Take 20 mEq by mouth daily.      . rosuvastatin (CRESTOR) 5 MG  tablet Take 5 mg by mouth daily.        . traMADol (ULTRAM) 50 MG tablet Take by mouth every 6 (six) hours as needed.      Marland Kitchen VITAMIN E PO Take 1 tablet by mouth daily.       No current facility-administered medications for this visit.    No Known Allergies  Patient Active Problem List   Diagnosis Date Noted  . Malaise and fatigue 03/28/2014  . AAA (abdominal aortic aneurysm) 08/18/2013  . Abdominal aneurysm without mention of rupture 08/15/2013  . Abdominal aortic aneurysm 08/02/2013  . Left leg pain 07/30/2013  . Back pain 07/30/2013  . S/P aortic valve replacement with bioprosthetic valve 09/11/2011  . Hx of CABG 09/11/2011  . Dizziness 08/05/2011  . Sinus bradycardia 05/06/2011  . Ischemic heart disease 05/06/2011  . Benign hypertensive heart disease without  heart failure 05/06/2011    History  Smoking status  . Former Smoker  . Types: Cigarettes  . Quit date: 08/15/1953  Smokeless tobacco  . Former Systems developer  . Quit date: 08/15/1954    History  Alcohol Use No    Family History  Problem Relation Age of Onset  . Hypertension Mother   . Hypertension Sister     Review of Systems: Constitutional: no fever chills diaphoresis or fatigue or change in weight.  Head and neck: no hearing loss, no epistaxis, no photophobia or visual disturbance. Respiratory: No cough, shortness of breath or wheezing. Cardiovascular: No chest pain peripheral edema, palpitations. Gastrointestinal: No abdominal distention, no abdominal pain, no change in bowel habits hematochezia or melena. Genitourinary: No dysuria, no frequency, no urgency, no nocturia. Musculoskeletal:No arthralgias, no back pain, no gait disturbance or myalgias. Neurological: No dizziness, no headaches, no numbness, no seizures, no syncope, no weakness, no tremors. Hematologic: No lymphadenopathy, no easy bruising. Psychiatric: No confusion, no hallucinations, no sleep disturbance.    Physical Exam: Filed Vitals:   03/28/14 0848  BP: 123/59  Pulse: 49   the general appearance reveals a well-developed alert elderly gentleman in no distress.The head and neck exam reveals pupils equal and reactive.  Extraocular movements are full.  There is no scleral icterus.  The mouth and pharynx are normal.  The neck is supple.  The carotids reveal no bruits.  The jugular venous pressure is normal.  The  thyroid is not enlarged.  There is no lymphadenopathy.  The chest is clear to percussion and auscultation.  There are no rales or rhonchi.  Expansion of the chest is symmetrical.  The precordium is quiet.  The first heart sound is normal.  The second heart sound is physiologically split.  There is grade 2/6 systolic murmur at the base.  No diastolic murmur..  There is no abnormal lift or heave.  The abdomen  is soft and nontender.  The bowel sounds are normal.  The liver and spleen are not enlarged.  There are no abdominal masses.  There are no abdominal bruits.  Extremities reveal 1+ pedal pulses.  There is no phlebitis or edema.  There is no cyanosis or clubbing.  Strength is normal and symmetrical in all extremities.  There is no lateralizing weakness.  There are no sensory deficits.  The skin is warm and dry.  There is no rash.  EKG shows marked sinus bradycardia.  There is bifascicular block.  Since last tracing of 07/29/13, heart rate is slower.  Assessment / Plan: 1.  Ischemic heart disease status post CABG in 2004. 2.  history of previous aortic stenosis.  Underwent aortic valve replacement in 2004 with a bioprosthetic valve. 3. status post stent graft repair of abdominal aortic aneurysm by Dr. Donnetta Hutching in December of 2014 4.  Chronic sinus bradycardia with bifascicular block 5. benign hypertensive heart disease without heart failure 6. Osteoarthritis 7. easy bruising  Disposition continue same medication except reduce aspirin to just 81 mg every other day instead of full dose aspirin.  This should help his bruising.  For his osteoarthritis of her legs he will use Tylenol. We are checking a TSH basal metabolic panel and CBC today Recheck in 4 months for office visit EKG and basal metabolic panel.

## 2014-03-28 NOTE — Progress Notes (Signed)
Quick Note:  Please report to patient. The recent labs are stable. Continue same medication and careful diet. The anemia is improved since last visit. ______

## 2014-03-28 NOTE — Assessment & Plan Note (Signed)
The patient has had no recurrent chest pain or angina 

## 2014-04-02 ENCOUNTER — Encounter: Payer: Self-pay | Admitting: Vascular Surgery

## 2014-04-03 ENCOUNTER — Encounter: Payer: Self-pay | Admitting: Vascular Surgery

## 2014-04-03 ENCOUNTER — Ambulatory Visit (INDEPENDENT_AMBULATORY_CARE_PROVIDER_SITE_OTHER): Payer: Medicare Other | Admitting: Vascular Surgery

## 2014-04-03 VITALS — BP 111/73 | HR 72 | Ht 66.0 in | Wt 155.0 lb

## 2014-04-03 DIAGNOSIS — Z48812 Encounter for surgical aftercare following surgery on the circulatory system: Secondary | ICD-10-CM

## 2014-04-03 DIAGNOSIS — I714 Abdominal aortic aneurysm, without rupture, unspecified: Secondary | ICD-10-CM

## 2014-04-03 NOTE — Addendum Note (Signed)
Addended by: Mena Goes on: 04/03/2014 04:22 PM   Modules accepted: Orders

## 2014-04-03 NOTE — Progress Notes (Signed)
Patient name: [name] [name] MRN: [id number] DOB: 30-Jan-1930 Sex: male   Referred by: [name] [name]  Reason for referral:  Chief Complaint  Patient presents with  . Re-evaluation    6 month f/u with CTA abd/pelvis    HISTORY OF PRESENT ILLNESS: Patient presents today for CT followup of stent graft repair of 6 cm abdominal aortic aneurysm. His procedures on 08/18/2013. He continues to be quite active in his usual health. He does report some stiffness in his hips and thigh he first arises in the morning and this is improved after walking. No claudication type symptoms.  Past Medical History  Diagnosis Date  . Aortic stenosis     s/p AVR in 2004  . Spasmodic torticollis     recent with persistant headaches  . Sinus bradycardia   . Bifascicular block   . Hypercholesterolemia     On Simvastatin  . Coronary artery disease     s/p CABG in 2004  . Right bundle branch block   . Abnormal Holter monitor finding November 2012    Runs of SVT with profound bradycardia  . AAA (abdominal aortic aneurysm)     Past Surgical History  Procedure Laterality Date  . Coronary artery bypass graft  10-30-02    x8 per Dr. Cyndia [name] with LIMA to LAD, SVG to PD, SVG to 2nd DX/1st DX, & SVG to subbranch of the 1st DX & 1st OM  . Aortic valve replacement  10-30-02    insertion of a bioprosthetic #76mm AVR  . Abdominal aortic endovascular stent graft N/A 08/18/2013    Procedure: ABDOMINAL AORTIC ENDOVASCULAR STENT GRAFT;  Surgeon: [name] Posner, MD;  Location: [facility] OR;  Service: Vascular;  Laterality: N/A;    History   Social History  . Marital Status: Widowed    Spouse Name: N/A    Number of Children: N/A  . Years of Education: N/A   Occupational History  . Not on file.   Social History Main Topics  . Smoking status: Former Smoker    Types: Cigarettes    Quit date: 08/15/1953  . Smokeless tobacco: Former Systems developer    Quit date: 08/15/1954  . Alcohol Use: No  . Drug Use: No  . Sexual Activity: Not  on file   Other Topics Concern  . Not on file   Social History Narrative  . No narrative on file    Family History  Problem Relation Age of Onset  . Hypertension Mother   . Hypertension Sister     Allergies as of 04/03/2014  . (No Known Allergies)    Current Outpatient Prescriptions on File Prior to Visit  Medication Sig Dispense Refill  . acetaminophen (TYLENOL) 325 MG tablet Take 325 mg by mouth every 6 (six) hours as needed.      Marland Kitchen amLODipine (NORVASC) 2.5 MG tablet Take 2.5-5 mg by mouth 2 (two) times daily. Takes 5mg  in morning and 2.5mg  at bedtime      . bimatoprost (LUMIGAN) 0.03 % ophthalmic solution Place 1 drop into both eyes at bedtime.       . brimonidine (ALPHAGAN P) 0.1 % SOLN Place 1 drop into both eyes 2 (two) times daily.       . furosemide (LASIX) 80 MG tablet TAKE 1 TABLET BY MOUTH EVERY DAY  30 tablet  6  . guaiFENesin (MUCINEX) 600 MG 12 hr tablet Take 600 mg by mouth daily as needed for to loosen phlegm.      Marland Kitchen  KLOR-CON M20 20 MEQ tablet TAKE 1 TABLET (20 MEQ TOTAL) BY MOUTH DAILY.  30 tablet  5  . lisinopril (PRINIVIL,ZESTRIL) 5 MG tablet Take 1 tablet (5 mg total) by mouth daily.  30 tablet  5  . loratadine (CLARITIN) 10 MG tablet Take 10 mg by mouth daily as needed for allergies.      . rosuvastatin (CRESTOR) 5 MG tablet Take 5 mg by mouth daily.        Marland Kitchen VITAMIN E PO Take 1 tablet by mouth daily.      Marland Kitchen aspirin 81 MG tablet Take 81 mg by mouth every other day.      . Cholecalciferol (VITAMIN D PO) Take 1 tablet by mouth daily.      . cyclobenzaprine (FLEXERIL) 10 MG tablet Take 1 tablet (10 mg total) by mouth 3 (three) times daily as needed for muscle spasms.  30 tablet  0  . meloxicam (MOBIC) 7.5 MG tablet Take 7.5 mg by mouth daily.      . potassium chloride SA (K-DUR,KLOR-CON) 20 MEQ tablet Take 20 mEq by mouth daily.      . traMADol (ULTRAM) 50 MG tablet Take by mouth every 6 (six) hours as needed.       No current facility-administered medications  on file prior to visit.        PHYSICAL EXAMINATION:  General: The patient is a well-nourished male, in no acute distress. Vital signs are BP 111/73  Pulse 72  Ht 5\' 6"  (1.676 m)  Wt 155 lb (70.308 kg)  BMI 25.03 kg/m2  SpO2 96% Pulmonary: There is a good air exchange bilaterally  Abdomen: Soft and non-tender. No masses noted and no pulsatile nature of his aorta Musculoskeletal: There are no major deformities.  There is no significant extremity pain. Neurologic: No focal weakness or paresthesias are detected, Skin: There are no ulcer or rashes noted. Psychiatric: The patient has normal affect. Cardiovascular: 2+ femoral and dorsalis pedis pulses bilaterally   CT scan from 03/20/2014 was reviewed and discussed with the patient. This shows excellent positioning of the stent graft with no evidence of endoleak. Maximal diameter is 5.9 cm down from 6.0 cm from a study 6 months ago  Impression and Plan:  Stable status post stent graft repair December 2014. Patient continues usual activities. We will see him again in one year with repeat CT scan    [name] [name] [facility]: [phone]

## 2014-04-18 ENCOUNTER — Other Ambulatory Visit: Payer: Self-pay | Admitting: *Deleted

## 2014-04-18 MED ORDER — FUROSEMIDE 80 MG PO TABS: ORAL_TABLET | ORAL | Status: DC

## 2014-04-18 MED ORDER — POTASSIUM CHLORIDE CRYS ER 20 MEQ PO TBCR: EXTENDED_RELEASE_TABLET | ORAL | Status: DC

## 2014-04-18 MED ORDER — LISINOPRIL 5 MG PO TABS: 5.0000 mg | ORAL_TABLET | Freq: Every day | ORAL | Status: DC

## 2014-07-30 ENCOUNTER — Ambulatory Visit (INDEPENDENT_AMBULATORY_CARE_PROVIDER_SITE_OTHER): Payer: Medicare Other | Admitting: Cardiology

## 2014-07-30 VITALS — BP 140/70 | HR 53 | Ht 68.0 in | Wt 156.0 lb

## 2014-07-30 DIAGNOSIS — I119 Hypertensive heart disease without heart failure: Secondary | ICD-10-CM

## 2014-07-30 DIAGNOSIS — R42 Dizziness and giddiness: Secondary | ICD-10-CM

## 2014-07-30 DIAGNOSIS — R5381 Other malaise: Secondary | ICD-10-CM

## 2014-07-30 DIAGNOSIS — Z951 Presence of aortocoronary bypass graft: Secondary | ICD-10-CM

## 2014-07-30 DIAGNOSIS — I452 Bifascicular block: Secondary | ICD-10-CM

## 2014-07-30 DIAGNOSIS — R001 Bradycardia, unspecified: Secondary | ICD-10-CM

## 2014-07-30 DIAGNOSIS — R5383 Other fatigue: Secondary | ICD-10-CM

## 2014-07-30 DIAGNOSIS — I259 Chronic ischemic heart disease, unspecified: Secondary | ICD-10-CM

## 2014-07-30 NOTE — Assessment & Plan Note (Signed)
The patient has not been experiencing any chest pain or angina. 

## 2014-07-30 NOTE — Assessment & Plan Note (Signed)
We checked thyroid function and CBC in July 2015 and both were satisfactory

## 2014-07-30 NOTE — Assessment & Plan Note (Signed)
The patient has sinus bradycardia and bifascicular block.  He has been having problems of dizzy spells and near syncope.  This is been going on for several weeks.  We will have him wear an event monitor for 3 weeks.

## 2014-07-30 NOTE — Progress Notes (Signed)
[name] [name] Date of Birth:  07/29/1930 [name] [address] [place], [place]  [address] [phone]        Fax   [phone]   History of Present Illness: This pleasant 80 year old gentleman is seen for a followup office visit. He was seen in November 2014 in [facility] emergency room for back pain. An MRI revealed a previously unknown large abdominal aortic aneurysm measuring 5.4 x 5.3 cm. he underwent preoperative risk assessment with echocardiogram and nuclear stress test. His echocardiogram on 08/09/13 showed good bioprosthetic aortic valve function and his ejection fraction was 60-65%. His Lexi scan Myoview stress test showed ejection fraction of 58% and there was a moderate sized old inferior wall scar with minimal peri-infarct ischemia. He went on to have a successful treatment as is 5.5 cm abdominal aortic aneurysm by Dr. Sherren Mocha early on 08/18/13 treated with endovascular stent graft.  He has a history of previous aortic stenosis. In 2004 he underwent surgery for aortic valve stenosis with insertion of a bioprosthetic valve. At the same time he underwent coronary artery bypass graft surgery x5 .The patient has done well postoperatively. The patient has a history of marked chronic intrinsic sinus bradycardia with bifascicular block. He has not had complete heart block.  He has had occasional mild dizziness.  No syncope. His last visit he has continued to do well. He denies any chest pain.  He remains physically active.  Since last visit he has been having some arthritic complaints in his left leg. He has noted some easy bruising.  This has improved since he cut back on his baby aspirin to just every other day.   Current Outpatient Prescriptions  Medication Sig Dispense Refill  . acetaminophen (TYLENOL) 325 MG tablet Take 325 mg by mouth every 6 (six) hours as needed.    Marland Kitchen amLODipine (NORVASC) 2.5 MG tablet Take 2.5-5 mg by mouth 2 (two) times daily.  Takes 5mg  in morning and 2.5mg  at bedtime    . aspirin 81 MG tablet Take 81 mg by mouth every other day.    . bimatoprost (LUMIGAN) 0.03 % ophthalmic solution Place 1 drop into both eyes at bedtime.     . brimonidine (ALPHAGAN P) 0.1 % SOLN Place 1 drop into both eyes 2 (two) times daily.     . Cholecalciferol (VITAMIN D PO) Take 1 tablet by mouth daily.    . cyclobenzaprine (FLEXERIL) 10 MG tablet Take 1 tablet (10 mg total) by mouth 3 (three) times daily as needed for muscle spasms. 30 tablet 0  . furosemide (LASIX) 80 MG tablet TAKE 1 TABLET BY MOUTH EVERY DAY 90 tablet 0  . guaiFENesin (MUCINEX) 600 MG 12 hr tablet Take 600 mg by mouth daily as needed for to loosen phlegm.    Marland Kitchen lisinopril (PRINIVIL,ZESTRIL) 5 MG tablet Take 1 tablet (5 mg total) by mouth daily. 90 tablet 0  . loratadine (CLARITIN) 10 MG tablet Take 10 mg by mouth daily as needed for allergies.    . meloxicam (MOBIC) 7.5 MG tablet Take 7.5 mg by mouth daily.    . potassium chloride SA (KLOR-CON M20) 20 MEQ tablet TAKE 1 TABLET (20 MEQ TOTAL) BY MOUTH DAILY. 90 tablet 0  . rosuvastatin (CRESTOR) 5 MG tablet Take 5 mg by mouth daily.      . traMADol (ULTRAM) 50 MG tablet Take by mouth every 6 (six) hours as needed.    Marland Kitchen VITAMIN E PO Take 1  tablet by mouth daily.     No current facility-administered medications for this visit.    No Known Allergies  Patient Active Problem List   Diagnosis Date Noted  . Dizziness of unknown cause 07/30/2014  . Malaise and fatigue 03/28/2014  . AAA (abdominal aortic aneurysm) 08/18/2013  . Abdominal aneurysm without mention of rupture 08/15/2013  . Abdominal aortic aneurysm 08/02/2013  . Left leg pain 07/30/2013  . Back pain 07/30/2013  . S/P aortic valve replacement with bioprosthetic valve 09/11/2011  . Hx of CABG 09/11/2011  . Dizziness 08/05/2011  . Sinus bradycardia 05/06/2011  . Ischemic heart disease 05/06/2011  . Benign hypertensive heart disease without heart failure  05/06/2011    History  Smoking status  . Former Smoker  . Types: Cigarettes  . Quit date: 08/15/1953  Smokeless tobacco  . Former Systems developer  . Quit date: 08/15/1954    History  Alcohol Use No    Family History  Problem Relation Age of Onset  . Hypertension Mother   . Hypertension Sister     Review of Systems: Constitutional: no fever chills diaphoresis or fatigue or change in weight.  Head and neck: no hearing loss, no epistaxis, no photophobia or visual disturbance. Respiratory: No cough, shortness of breath or wheezing. Cardiovascular: No chest pain peripheral edema, palpitations. Gastrointestinal: No abdominal distention, no abdominal pain, no change in bowel habits hematochezia or melena. Genitourinary: No dysuria, no frequency, no urgency, no nocturia. Musculoskeletal:No arthralgias, no back pain, no gait disturbance or myalgias. Neurological: No dizziness, no headaches, no numbness, no seizures, no syncope, no weakness, no tremors. Hematologic: No lymphadenopathy, no easy bruising. Psychiatric: No confusion, no hallucinations, no sleep disturbance.    Physical Exam: Filed Vitals:   07/30/14 0905  BP: 140/70  Pulse: 53   the general appearance reveals a well-developed alert elderly gentleman in no distress.The head and neck exam reveals pupils equal and reactive.  Extraocular movements are full.  There is no scleral icterus.  The mouth and pharynx are normal.  The neck is supple.  The carotids reveal no bruits.  The jugular venous pressure is normal.  The  thyroid is not enlarged.  There is no lymphadenopathy.  The chest is clear to percussion and auscultation.  There are no rales or rhonchi.  Expansion of the chest is symmetrical.  The precordium is quiet.  The first heart sound is normal.  The second heart sound is physiologically split.  There is grade 2/6 systolic murmur at the base.  No diastolic murmur..  There is no abnormal lift or heave.  The abdomen is soft and  nontender.  The bowel sounds are normal.  The liver and spleen are not enlarged.  There are no abdominal masses.  There are no abdominal bruits.  Extremities reveal 1+ pedal pulses.  There is no phlebitis or edema.  There is no cyanosis or clubbing.  Strength is normal and symmetrical in all extremities.  There is no lateralizing weakness.  There are no sensory deficits.  The skin is warm and dry.  There is no rash.  EKG shows marked sinus bradycardia.  There is bifascicular block.  Since last tracing of 07/29/13, heart rate is slower.  Assessment / Plan: 1.  Ischemic heart disease status post CABG in 2004. 2. history of previous aortic stenosis.  Underwent aortic valve replacement in 2004 with a bioprosthetic valve. 3. status post stent graft repair of abdominal aortic aneurysm by Dr. Donnetta Hutching in December of 2014 4.  Chronic sinus bradycardia with bifascicular block 5. benign hypertensive heart disease without heart failure 6. Osteoarthritis 7. easy bruising 8.  Worsening dizzy spells with near syncope.  Disposition: We will have him wear an event monitor for 3 weeks to see if he might need a pacemaker.  He has sinus bradycardia and bifascicular block and he is not on any AV blocking drugs. Recheck in 4 months for office visit and EKG. : We are checking a TSH basal metabolic panel and CBC today Recheck in 4 months for office visit EKG and basal metabolic panel.

## 2014-07-30 NOTE — Assessment & Plan Note (Signed)
The blood pressure is remaining stable on current therapy.  He is not having any symptoms of CHF.

## 2014-07-30 NOTE — Patient Instructions (Signed)
Your physician has recommended that you wear an event monitor. Event monitors are medical devices that record the heart's electrical activity. Doctors most often [place] these monitors to diagnose arrhythmias. Arrhythmias are problems with the speed or rhythm of the heartbeat. The monitor is a small, portable device. You can wear one while you do your normal daily activities. This is usually used to diagnose what is causing palpitations/syncope (passing out). [facility] recommends that you continue on your current medications as directed. Please refer to the Current Medication list given to you today.  Your physician recommends that you schedule a follow-up appointment in: [place]

## 2014-07-31 ENCOUNTER — Encounter (INDEPENDENT_AMBULATORY_CARE_PROVIDER_SITE_OTHER): Payer: Medicare Other

## 2014-07-31 ENCOUNTER — Encounter: Payer: Self-pay | Admitting: *Deleted

## 2014-07-31 DIAGNOSIS — I452 Bifascicular block: Secondary | ICD-10-CM

## 2014-07-31 DIAGNOSIS — R001 Bradycardia, unspecified: Secondary | ICD-10-CM

## 2014-07-31 DIAGNOSIS — R42 Dizziness and giddiness: Secondary | ICD-10-CM

## 2014-07-31 NOTE — Progress Notes (Signed)
Patient ID: [name] [name], male   DOB: [birth year], 81 y.o.   MRN: [id number] [employer] 30 day cardiac event monitor applied to patient.

## 2014-08-18 IMAGING — CR DG ANKLE COMPLETE 3+V*L*
3 series · 3 of 3 positions shown · non-contrast
Comparison: None.

CLINICAL DATA: Pain and swelling since a fall last week.

EXAM:
LEFT ANKLE COMPLETE - 3+ VIEW

[t ankle joint ap left]
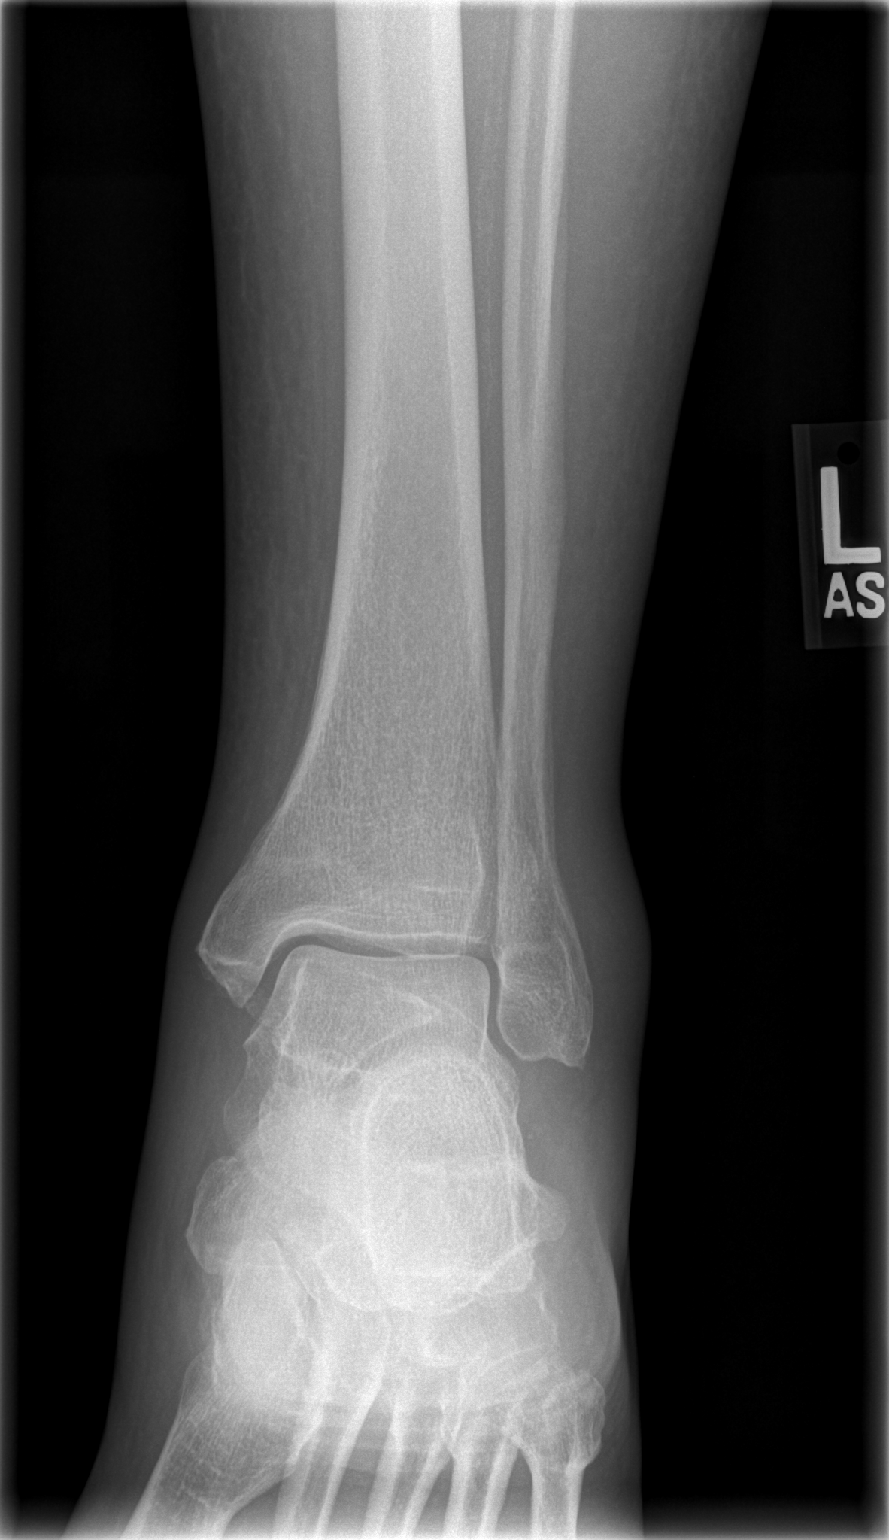

[t ankle joint oblique left]
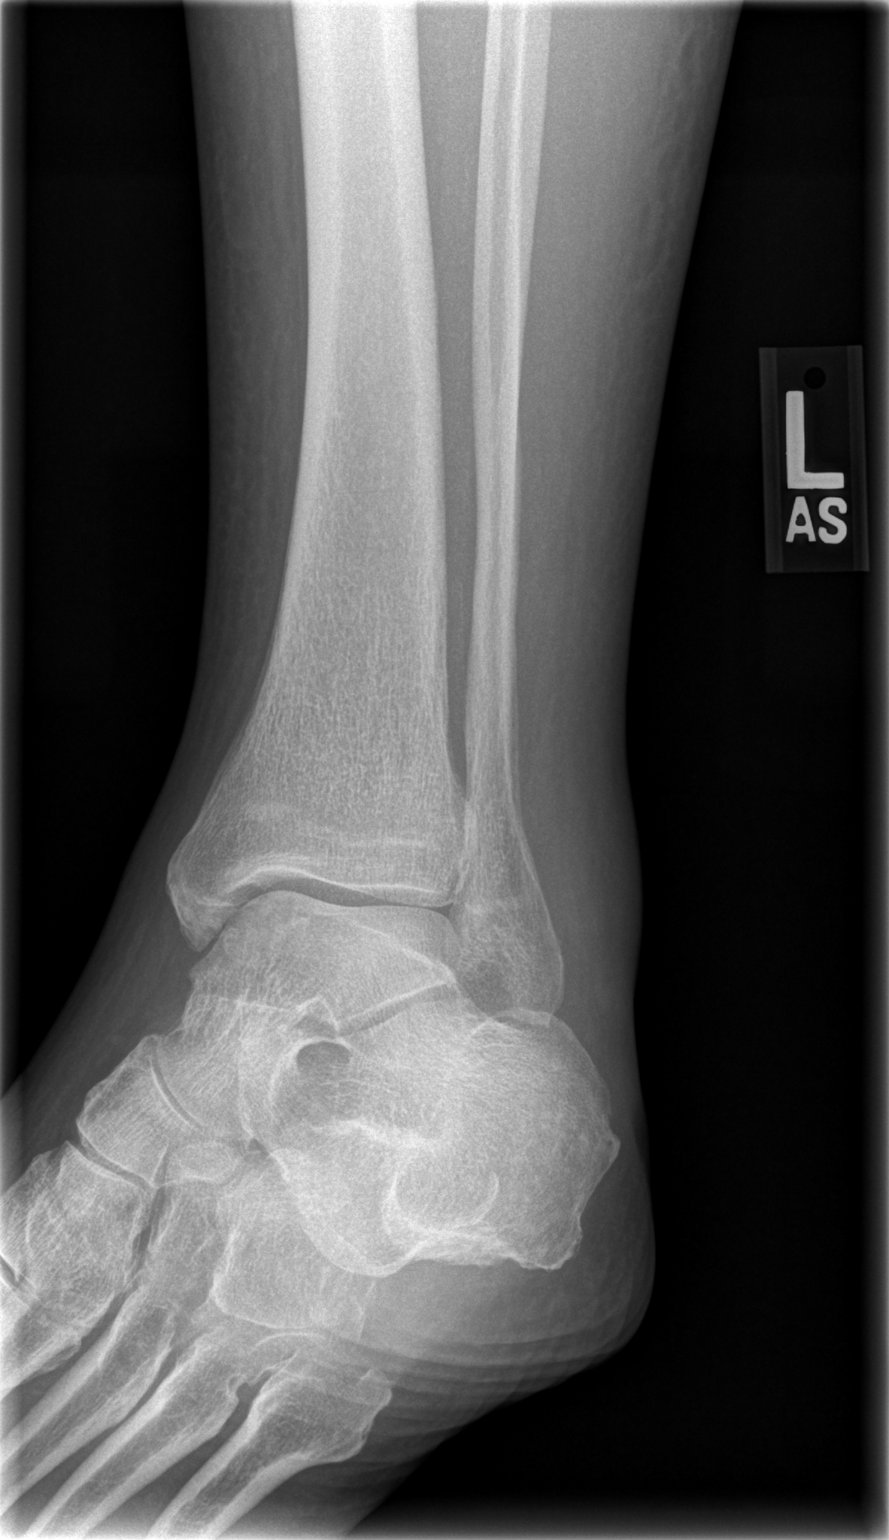

[t ankle joint lat left]
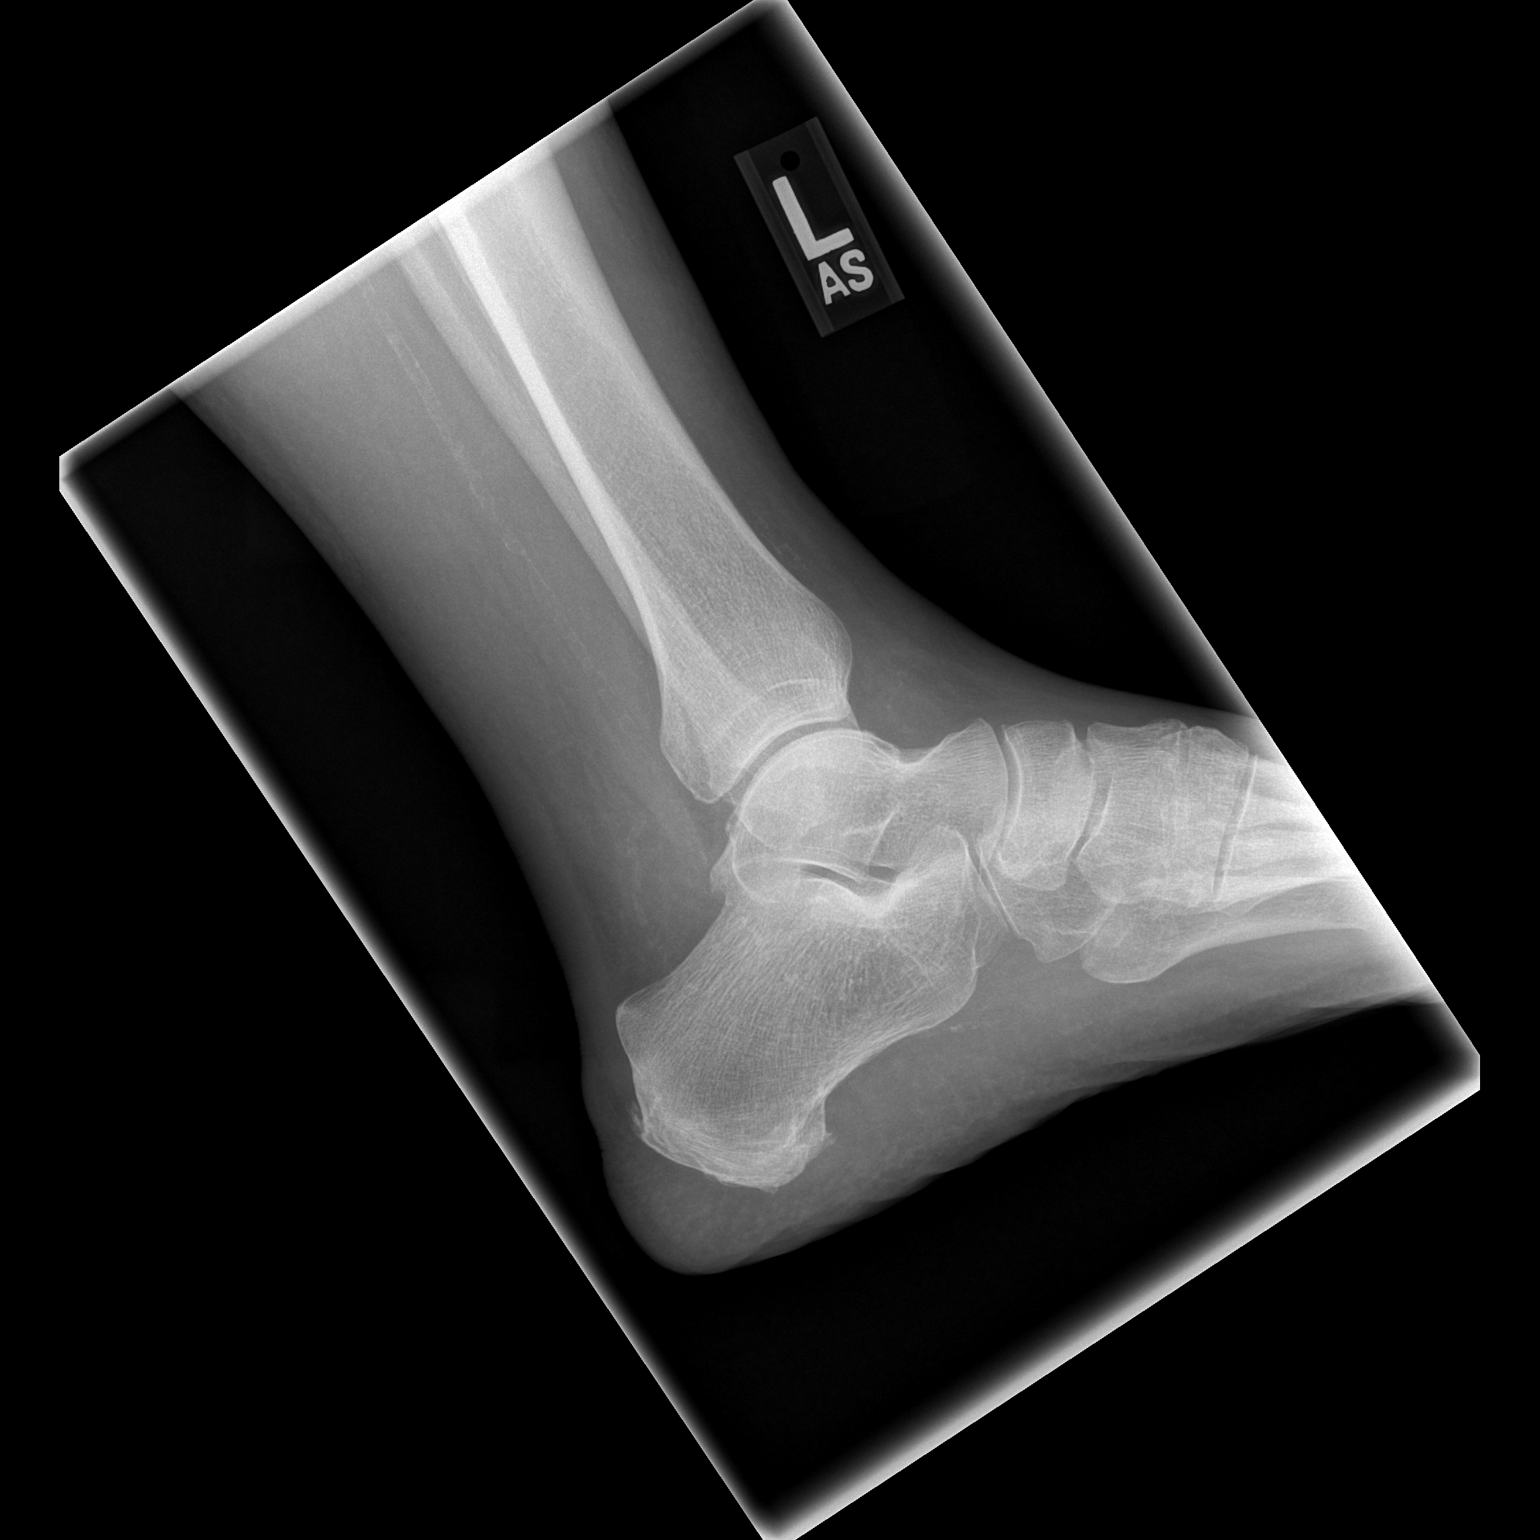

[3 of 3 positions shown; findings below may reference images not displayed]

FINDINGS: On the oblique view there appears to be an avulsion fracture through
the dorsal aspect of either the medial or middle cuneiform which is
not appreciable on the foot radiographs. This could be documented by
a CT scan without contrast if clinically indicated.

There is soft tissue swelling around the ankle joint. There is no
joint effusion. Vascular calcifications seen around the medial
malleolus.
IMPRESSION: Probable avulsion fracture through the dorsal aspect of the middle
or medial cuneiform.

No acute abnormality of the bones of the ankle. Circumferential soft
tissue swelling.

## 2014-09-03 ENCOUNTER — Other Ambulatory Visit: Payer: Self-pay | Admitting: Cardiology

## 2014-09-04 NOTE — Telephone Encounter (Signed)
Darlin Coco, MD at 07/30/2014 9:17 AM  lisinopril (PRINIVIL,ZESTRIL) 5 MG tablet  Take 1 tablet (5 mg total) by mouth daily     Your physician recommends that you continue on your current medications as directed.  Please refer to the Current Medication list given to you today.

## 2014-09-11 ENCOUNTER — Telehealth: Payer: Self-pay | Admitting: *Deleted

## 2014-09-11 NOTE — Telephone Encounter (Signed)
Advised patient of results  30 day event monitor reviewed by  Dr. Mare Ferrari  Interpretation:  No atrial fibrillation, occasional PVC's

## 2014-10-19 ENCOUNTER — Other Ambulatory Visit: Payer: Self-pay | Admitting: Cardiology

## 2014-10-27 ENCOUNTER — Emergency Department (HOSPITAL_COMMUNITY): Payer: Medicare Other

## 2014-10-27 ENCOUNTER — Emergency Department (HOSPITAL_COMMUNITY)
Admission: EM | Admit: 2014-10-27 | Discharge: 2014-10-27 | Disposition: A | Discharge status: Home / Self Care | Payer: Medicare Other | Attending: Emergency Medicine | Admitting: Emergency Medicine

## 2014-10-27 ENCOUNTER — Encounter (HOSPITAL_COMMUNITY): Payer: Self-pay | Admitting: *Deleted

## 2014-10-27 DIAGNOSIS — I2510 Atherosclerotic heart disease of native coronary artery without angina pectoris: Secondary | ICD-10-CM | POA: Diagnosis not present

## 2014-10-27 DIAGNOSIS — Z8669 Personal history of other diseases of the nervous system and sense organs: Secondary | ICD-10-CM | POA: Diagnosis not present

## 2014-10-27 DIAGNOSIS — Z87891 Personal history of nicotine dependence: Secondary | ICD-10-CM | POA: Diagnosis not present

## 2014-10-27 DIAGNOSIS — Z951 Presence of aortocoronary bypass graft: Secondary | ICD-10-CM | POA: Diagnosis not present

## 2014-10-27 DIAGNOSIS — K5900 Constipation, unspecified: Secondary | ICD-10-CM | POA: Diagnosis not present

## 2014-10-27 DIAGNOSIS — Z7982 Long term (current) use of aspirin: Secondary | ICD-10-CM | POA: Insufficient documentation

## 2014-10-27 DIAGNOSIS — R109 Unspecified abdominal pain: Secondary | ICD-10-CM

## 2014-10-27 DIAGNOSIS — Z791 Long term (current) use of non-steroidal anti-inflammatories (NSAID): Secondary | ICD-10-CM | POA: Insufficient documentation

## 2014-10-27 DIAGNOSIS — Z9889 Other specified postprocedural states: Secondary | ICD-10-CM | POA: Insufficient documentation

## 2014-10-27 DIAGNOSIS — E780 Pure hypercholesterolemia: Secondary | ICD-10-CM | POA: Insufficient documentation

## 2014-10-27 LAB — COMPREHENSIVE METABOLIC PANEL
ALT: 15 U/L (ref 0–53)
AST: 19 U/L (ref 0–37)
Albumin: 4.4 g/dL (ref 3.5–5.2)
Alkaline Phosphatase: 59 U/L (ref 39–117)
Anion gap: 8 (ref 5–15)
BUN: 20 mg/dL (ref 6–23)
CALCIUM: 9.4 mg/dL (ref 8.4–10.5)
CHLORIDE: 99 mmol/L (ref 96–112)
CO2: 31 mmol/L (ref 19–32)
CREATININE: 1.27 mg/dL (ref 0.50–1.35)
GFR calc non Af Amer: 51 mL/min — ABNORMAL LOW (ref >90)
GFR, EST AFRICAN AMERICAN: 59 mL/min — AB (ref >90)
Glucose, Bld: 221 mg/dL — ABNORMAL HIGH (ref 70–99)
Potassium: 4.3 mmol/L (ref 3.5–5.1)
SODIUM: 138 mmol/L (ref 135–145)
Total Bilirubin: 1.1 mg/dL (ref 0.3–1.2)
Total Protein: 6.6 g/dL (ref 6.0–8.3)

## 2014-10-27 LAB — URINALYSIS, ROUTINE W REFLEX MICROSCOPIC
Bilirubin Urine: NEGATIVE
GLUCOSE, UA: NEGATIVE mg/dL
Hgb urine dipstick: NEGATIVE
KETONES UR: 15 mg/dL — AB
LEUKOCYTES UA: NEGATIVE
Nitrite: NEGATIVE
PH: 5.5 (ref 5.0–8.0)
PROTEIN: NEGATIVE mg/dL
Specific Gravity, Urine: 1.022 (ref 1.005–1.030)
Urobilinogen, UA: 1 mg/dL (ref 0.0–1.0)

## 2014-10-27 LAB — CBC WITH DIFFERENTIAL/PLATELET
BASOS ABS: 0 10*3/uL (ref 0.0–0.1)
Basophils Relative: 0 % (ref 0–1)
Eosinophils Absolute: 0.1 10*3/uL (ref 0.0–0.7)
Eosinophils Relative: 1 % (ref 0–5)
HCT: 37.4 % — ABNORMAL LOW (ref 39.0–52.0)
HEMOGLOBIN: 12.7 g/dL — AB (ref 13.0–17.0)
LYMPHS PCT: 6 % — AB (ref 12–46)
Lymphs Abs: 0.6 10*3/uL — ABNORMAL LOW (ref 0.7–4.0)
MCH: 31 pg (ref 26.0–34.0)
MCHC: 34 g/dL (ref 30.0–36.0)
MCV: 91.2 fL (ref 78.0–100.0)
Monocytes Absolute: 0.4 10*3/uL (ref 0.1–1.0)
Monocytes Relative: 4 % (ref 3–12)
NEUTROS ABS: 7.7 10*3/uL (ref 1.7–7.7)
NEUTROS PCT: 89 % — AB (ref 43–77)
PLATELETS: 187 10*3/uL (ref 150–400)
RBC: 4.1 MIL/uL — ABNORMAL LOW (ref 4.22–5.81)
RDW: 13.7 % (ref 11.5–15.5)
WBC: 8.6 10*3/uL (ref 4.0–10.5)

## 2014-10-27 LAB — LIPASE, BLOOD: LIPASE: 21 U/L (ref 11–59)

## 2014-10-27 LAB — POC OCCULT BLOOD, ED: FECAL OCCULT BLD: NEGATIVE

## 2014-10-27 NOTE — ED Provider Notes (Signed)
CSN: [id number]     Arrival date & time 10/27/14  1935 History   First MD Initiated Contact with Patient 10/27/14 1959     Chief Complaint  Patient presents with  . Constipation     (Consider location/radiation/quality/duration/timing/severity/associated sxs/prior Treatment) HPI Comments: Patient is an 81 year old male with history of aortic stenosis, coronary artery disease. He presents with complaints of abdominal discomfort which has worsened over the past several days. He has not had a bowel movement since Thursday. He denies any fevers or chills. He denies any urinary complaints. He has had episodes similar to this in the past.  Patient is a 81 y.o. male presenting with constipation. The history is provided by the patient.  Constipation Severity:  Moderate Time since last bowel movement:  3 days Timing:  Constant Progression:  Worsening Chronicity:  New Stool description:  None produced Relieved by:  Nothing Worsened by:  Nothing tried Ineffective treatments: MiraLAX. Associated symptoms: abdominal pain     Past Medical History  Diagnosis Date  . Aortic stenosis     s/p AVR in 2004  . Spasmodic torticollis     recent with persistant headaches  . Sinus bradycardia   . Bifascicular block   . Hypercholesterolemia     On Simvastatin  . Coronary artery disease     s/p CABG in 2004  . Right bundle branch block   . Abnormal Holter monitor finding November 2012    Runs of SVT with profound bradycardia  . AAA (abdominal aortic aneurysm)    Past Surgical History  Procedure Laterality Date  . Coronary artery bypass graft  10-30-02    x8 per Dr. Cyndia Bent with LIMA to LAD, SVG to PD, SVG to 2nd DX/1st DX, & SVG to subbranch of the 1st DX & 1st OM  . Aortic valve replacement  10-30-02    insertion of a bioprosthetic #58mm AVR  . Abdominal aortic endovascular stent graft N/A 08/18/2013    Procedure: ABDOMINAL AORTIC ENDOVASCULAR STENT GRAFT;  Surgeon: Rosetta Posner, MD;  Location:  [facility] OR;  Service: Vascular;  Laterality: N/A;   Family History  Problem Relation Age of Onset  . Hypertension Mother   . Hypertension Sister    History  Substance Use Topics  . Smoking status: Former Smoker    Types: Cigarettes    Quit date: 08/15/1953  . Smokeless tobacco: Former Systems developer    Quit date: 08/15/1954  . Alcohol Use: No    Review of Systems  Gastrointestinal: Positive for abdominal pain and constipation.  All other systems reviewed and are negative.     Allergies  Review of patient's allergies indicates no known allergies.  Home Medications   Prior to Admission medications   Medication Sig Start Date End Date Taking? Authorizing Provider  acetaminophen (TYLENOL) 325 MG tablet Take 325 mg by mouth every 6 (six) hours as needed.    Historical Provider, MD  amLODipine (NORVASC) 2.5 MG tablet Take 2.5-5 mg by mouth 2 (two) times daily. Takes 5mg  in morning and 2.5mg  at bedtime    Historical Provider, MD  aspirin 81 MG tablet Take 81 mg by mouth every other day.    Historical Provider, MD  bimatoprost (LUMIGAN) 0.03 % ophthalmic solution Place 1 drop into both eyes at bedtime.     Historical Provider, MD  brimonidine (ALPHAGAN P) 0.1 % SOLN Place 1 drop into both eyes 2 (two) times daily.     Historical Provider, MD  Cholecalciferol (VITAMIN D PO) Take  1 tablet by mouth daily.    Historical Provider, MD  cyclobenzaprine (FLEXERIL) 10 MG tablet Take 1 tablet (10 mg total) by mouth 3 (three) times daily as needed for muscle spasms. 07/31/13   Bobby Rumpf [place], PA-C  furosemide (LASIX) 80 MG tablet TAKE 1 TABLET BY MOUTH EVERY DAY 10/22/14   Lendon Colonel, NP  guaiFENesin (MUCINEX) 600 MG 12 hr tablet Take 600 mg by mouth daily as needed for to loosen phlegm.    Historical Provider, MD  KLOR-CON M20 20 MEQ tablet TAKE 1 TABLET (20 MEQ TOTAL) BY MOUTH DAILY. 10/22/14   Lendon Colonel, NP  lisinopril (PRINIVIL,ZESTRIL) 5 MG tablet TAKE 1 TABLET (5 MG TOTAL) BY MOUTH  DAILY. 09/04/14   Darlin Coco, MD  loratadine (CLARITIN) 10 MG tablet Take 10 mg by mouth daily as needed for allergies.    Historical Provider, MD  meloxicam (MOBIC) 7.5 MG tablet Take 7.5 mg by mouth daily.    Historical Provider, MD  rosuvastatin (CRESTOR) 5 MG tablet Take 5 mg by mouth daily.      Historical Provider, MD  traMADol (ULTRAM) 50 MG tablet Take by mouth every 6 (six) hours as needed.    Historical Provider, MD  VITAMIN E PO Take 1 tablet by mouth daily.    Historical Provider, MD   BP 160/57 mmHg  Pulse 61  Temp(Src) 97.6 F (36.4 C) (Oral)  Resp 16  Ht 5\' 8"  (1.727 m)  Wt 157 lb (71.215 kg)  BMI 23.88 kg/m2  SpO2 99% Physical Exam  Constitutional: He is oriented to person, place, and time. He appears well-developed and well-nourished. No distress.  HENT:  Head: Normocephalic and atraumatic.  Neck: Normal range of motion. Neck supple.  Cardiovascular: Normal rate, regular rhythm and normal heart sounds.   No murmur heard. Pulmonary/Chest: Effort normal and breath sounds normal. No respiratory distress. He has no wheezes.  Abdominal: Soft. Bowel sounds are normal. He exhibits no distension. There is tenderness. There is no rebound and no guarding.  There is tenderness to palpation in all 4 quadrants.  Musculoskeletal: Normal range of motion. He exhibits no edema.  Neurological: He is alert and oriented to person, place, and time.  Skin: Skin is warm and dry. He is not diaphoretic.  Nursing note and vitals reviewed.   ED Course  Procedures (including critical care time) Labs Review Labs Reviewed  CBC WITH DIFFERENTIAL/PLATELET  COMPREHENSIVE METABOLIC PANEL  LIPASE, BLOOD  URINALYSIS, ROUTINE W REFLEX MICROSCOPIC  POC OCCULT BLOOD, ED    Imaging Review No results found.   EKG Interpretation None      MDM   Final diagnoses:  Abdominal pain    Patient is an 81 year old male who presents with complaints of abdominal pain and no bowel movement  in the past 3 days. His rectal exam reveals no fecal impaction and stool was heme-negative. His laboratory studies do not show an elevation of white count and electrolytes are essentially unremarkable. Acute abdominal series shows no bowel obstruction, however he does appear to have a good deal of stool. As he has not had a bowel movement in 3 days, this fits the clinical picture. I will recommend magnesium citrate and when necessary return.    Veryl Speak, MD 10/27/14 2141

## 2014-10-27 NOTE — ED Notes (Signed)
The pt has not had a normal bm since Thursday.  Since this am he has had  abd pain with nv

## 2014-10-27 NOTE — Discharge Instructions (Signed)
Magnesium citrate: Drink entire 10 ounce bottle mixed with equal parts Sprite or Gatorade for relief of constipation.  Return to the emergency department if you develop worsening pain, high fever, bloody stool, or other new and concerning symptoms.   Constipation Constipation is when a person has fewer than three bowel movements a week, has difficulty having a bowel movement, or has stools that are dry, hard, or larger than normal. As people grow older, constipation is more common. If you try to fix constipation with medicines that make you have a bowel movement (laxatives), the problem may get worse. Long-term laxative use may cause the muscles of the colon to become weak. A low-fiber diet, not taking in enough fluids, and taking certain medicines may make constipation worse.  CAUSES   Certain medicines, such as antidepressants, pain medicine, iron supplements, antacids, and water pills.   Certain diseases, such as diabetes, irritable bowel syndrome (IBS), thyroid disease, or depression.   Not drinking enough water.   Not eating enough fiber-rich foods.   Stress or travel.   Lack of physical activity or exercise.   Ignoring the urge to have a bowel movement.   Using laxatives too much.  SIGNS AND SYMPTOMS   Having fewer than three bowel movements a week.   Straining to have a bowel movement.   Having stools that are hard, dry, or larger than normal.   Feeling full or bloated.   Pain in the lower abdomen.   Not feeling relief after having a bowel movement.  DIAGNOSIS  Your health care provider will take a medical history and perform a physical exam. Further testing may be done for severe constipation. Some tests may include:  A barium enema X-ray to examine your rectum, colon, and, sometimes, your small intestine.   A sigmoidoscopy to examine your lower colon.   A colonoscopy to examine your entire colon. TREATMENT  Treatment will depend on the severity  of your constipation and what is causing it. Some dietary treatments include drinking more fluids and eating more fiber-rich foods. Lifestyle treatments may include regular exercise. If these diet and lifestyle recommendations do not help, your health care provider may recommend taking over-the-counter laxative medicines to help you have bowel movements. Prescription medicines may be prescribed if over-the-counter medicines do not work.  HOME CARE INSTRUCTIONS   Eat foods that have a lot of fiber, such as fruits, vegetables, whole grains, and beans.  Limit foods high in fat and processed sugars, such as french fries, hamburgers, cookies, candies, and soda.   A fiber supplement may be added to your diet if you cannot get enough fiber from foods.   Drink enough fluids to keep your urine clear or pale yellow.   Exercise regularly or as directed by your health care provider.   Go to the restroom when you have the urge to go. Do not hold it.   Only take over-the-counter or prescription medicines as directed by your health care provider. Do not take other medicines for constipation without talking to your health care provider first.  [place] IF:   You have bright red blood in your stool.   Your constipation lasts for more than 4 days or gets worse.   You have abdominal or rectal pain.   You have thin, pencil-like stools.   You have unexplained weight loss. MAKE SURE YOU:   Understand these instructions.  Will watch your condition.  Will get help right away if you are not doing  well or get worse. Document Released: 05/22/2004 Document Revised: 08/29/2013 Document Reviewed: 06/05/2013 [facility] Patient Information 2015 [place], [place]. This information is not intended to replace advice given to you by your health care provider. Make sure you discuss any questions you have with your health care provider.

## 2014-10-27 NOTE — ED Notes (Signed)
Taken to xray via wheelchair at this time.

## 2014-10-30 DIAGNOSIS — K439 Ventral hernia without obstruction or gangrene: Secondary | ICD-10-CM | POA: Insufficient documentation

## 2014-11-08 ENCOUNTER — Ambulatory Visit (INDEPENDENT_AMBULATORY_CARE_PROVIDER_SITE_OTHER): Payer: Self-pay | Admitting: General Surgery

## 2014-11-28 ENCOUNTER — Ambulatory Visit (INDEPENDENT_AMBULATORY_CARE_PROVIDER_SITE_OTHER): Payer: Medicare Other | Admitting: Cardiology

## 2014-11-28 ENCOUNTER — Encounter: Payer: Self-pay | Admitting: Cardiology

## 2014-11-28 VITALS — BP 138/68 | HR 60 | Ht 68.0 in | Wt 150.1 lb

## 2014-11-28 DIAGNOSIS — Z951 Presence of aortocoronary bypass graft: Secondary | ICD-10-CM | POA: Diagnosis not present

## 2014-11-28 DIAGNOSIS — K4020 Bilateral inguinal hernia, without obstruction or gangrene, not specified as recurrent: Secondary | ICD-10-CM

## 2014-11-28 DIAGNOSIS — Z953 Presence of xenogenic heart valve: Secondary | ICD-10-CM

## 2014-11-28 DIAGNOSIS — Z954 Presence of other heart-valve replacement: Secondary | ICD-10-CM

## 2014-11-28 DIAGNOSIS — K5901 Slow transit constipation: Secondary | ICD-10-CM | POA: Diagnosis not present

## 2014-11-28 DIAGNOSIS — I119 Hypertensive heart disease without heart failure: Secondary | ICD-10-CM | POA: Diagnosis not present

## 2014-11-28 NOTE — Patient Instructions (Addendum)
START MIRALAX 17 G DAILY   START COLACE 200 MG DAILY  DRINK PLENTY OF WATER  Your physician wants you to follow-up in: [place] will receive a reminder letter in the mail two months in advance. If you don't receive a letter, please call our office to schedule the follow-up appointment.

## 2014-11-28 NOTE — Progress Notes (Signed)
Cardiology Office Note   Date:  11/28/2014   ID:  [name] [name], DOB Feb 23, 1930, MRN [id number]  PCP:  [name] Argyle, MD  Cardiologist:   [name] Coco, MD   Chief Complaint  Patient presents with  . Follow-up    benign hypertensive heart disease without heart failure      History of Present Illness: [name] [name] is a 81 y.o. male who presents for follow-up office visit, and for preoperative surgical clearance for inguinal herniorrhaphy  This pleasant 81 year old gentleman is seen for a followup office visit. He was seen in November 2014 in [facility] emergency room for back pain. An MRI revealed a previously unknown large abdominal aortic aneurysm measuring 5.4 x 5.3 cm. he underwent preoperative risk assessment with echocardiogram and nuclear stress test. His echocardiogram on 08/09/13 showed good bioprosthetic aortic valve function and his ejection fraction was 60-65%. His Lexi scan Myoview stress test showed ejection fraction of 58% and there was a moderate sized old inferior wall scar with minimal peri-infarct ischemia. He went on to have a successful treatment as is 5.5 cm abdominal aortic aneurysm by Dr. Sherren Mocha early on 08/18/13 treated with endovascular stent graft. He has a history of previous aortic stenosis. In 2004 he underwent surgery for aortic valve stenosis with insertion of a bioprosthetic valve. At the same time he underwent coronary artery bypass graft surgery x5 .The patient has done well postoperatively. The patient has a history of marked chronic intrinsic sinus bradycardia with bifascicular block. He has not had complete heart block. He has had occasional mild dizziness. No syncope. His last visit he has continued to do well. He denies any chest pain. He remains physically active.  Since last visit he has been having some arthritic complaints in his left leg. He has noted some easy bruising. This has improved since he cut back on his baby  aspirin to just every other day. The patient has bilateral inguinal hernias, larger on the right.  He has seen Dr. Rosendo Gros, a surgeon, who has recommended laparoscopic repair. The patient has been having some constipation.  Yesterday he had some nausea and vomiting.  He has been trying to avoid straining because of his inguinal hernias.  His appetite is down and his weight is down 6 pounds since last visit.  Past Medical History  Diagnosis Date  . Aortic stenosis     s/p AVR in 2004  . Spasmodic torticollis     recent with persistant headaches  . Sinus bradycardia   . Bifascicular block   . Hypercholesterolemia     On Simvastatin  . Coronary artery disease     s/p CABG in 2004  . Right bundle branch block   . Abnormal Holter monitor finding November 2012    Runs of SVT with profound bradycardia  . AAA (abdominal aortic aneurysm)     Past Surgical History  Procedure Laterality Date  . Coronary artery bypass graft  10-30-02    x8 per Dr. Cyndia Bent with LIMA to LAD, SVG to PD, SVG to 2nd DX/1st DX, & SVG to subbranch of the 1st DX & 1st OM  . Aortic valve replacement  10-30-02    insertion of a bioprosthetic #26mm AVR  . Abdominal aortic endovascular stent graft N/A 08/18/2013    Procedure: ABDOMINAL AORTIC ENDOVASCULAR STENT GRAFT;  Surgeon: Rosetta Posner, MD;  Location: [facility] OR;  Service: Vascular;  Laterality: N/A;     Current Outpatient Prescriptions  Medication Sig Dispense Refill  .  acetaminophen (TYLENOL) 325 MG tablet Take 325 mg by mouth every 6 (six) hours as needed.    Marland Kitchen amLODipine (NORVASC) 2.5 MG tablet Take 2.5-5 mg by mouth 2 (two) times daily. Takes 5mg  in morning and 2.5mg  at bedtime    . aspirin 81 MG tablet Take 81 mg by mouth every other day.    . brimonidine (ALPHAGAN P) 0.1 % SOLN Place 1 drop into both eyes 2 (two) times daily.     Marland Kitchen docusate sodium (COLACE) 100 MG capsule Take 200 mg by mouth daily.    . furosemide (LASIX) 80 MG tablet TAKE 1 TABLET BY MOUTH EVERY  DAY 90 tablet 0  . guaiFENesin (MUCINEX) 600 MG 12 hr tablet Take 600 mg by mouth daily as needed for to loosen phlegm.    Marland Kitchen KLOR-CON M20 20 MEQ tablet TAKE 1 TABLET (20 MEQ TOTAL) BY MOUTH DAILY. 90 tablet 0  . lisinopril (PRINIVIL,ZESTRIL) 5 MG tablet TAKE 1 TABLET (5 MG TOTAL) BY MOUTH DAILY. 90 tablet 2  . loratadine (CLARITIN) 10 MG tablet Take 10 mg by mouth daily as needed for allergies.    Marland Kitchen LUMIGAN 0.01 % SOLN Place 1 drop into both eyes at bedtime.  3  . polyethylene glycol powder (GLYCOLAX/MIRALAX) powder Take 17 g by mouth daily.    . rosuvastatin (CRESTOR) 5 MG tablet Take 5 mg by mouth daily.       No current facility-administered medications for this visit.    Allergies:   Review of patient's allergies indicates no known allergies.    Social History:  The patient  reports that he quit smoking about 61 years ago. His smoking use included Cigarettes. He quit smokeless tobacco use about 60 years ago. He reports that he does not drink alcohol or use illicit drugs.   Family History:  The patient's family history includes Hypertension in his mother and sister.    ROS:  Please see the history of present illness.   Otherwise, review of systems are positive for none.   All other systems are reviewed and negative.    PHYSICAL EXAM: VS:  BP 138/68 mmHg  Pulse 60  Ht 5\' 8"  (1.727 m)  Wt 150 lb 1.9 oz (68.094 kg)  BMI 22.83 kg/m2 , BMI Body mass index is 22.83 kg/(m^2). GEN: Well nourished, well developed, in no acute distress HEENT: normal Neck: no JVD, carotid bruits, or masses Cardiac: RRR; there is a soft systolic ejection murmur at the base. Respiratory:  clear to auscultation bilaterally, normal work of breathing GI: soft, nontender, nondistended, + BS there are bilateral inguinal hernias larger on the right. MS: no deformity or atrophy Skin: warm and dry, no rash Neuro:  Strength and sensation are intact Psych: euthymic mood, full affect   EKG:  EKG is not ordered  today.    Recent Labs: 03/28/2014: TSH 1.40 10/27/2014: ALT 15; BUN 20; Creatinine 1.27; Hemoglobin 12.7*; Platelets 187; Potassium 4.3; Sodium 138    Lipid Panel    Component Value Date/Time   CHOL 138 03/09/2013 1650   TRIG 84.0 03/09/2013 1650   HDL 43.30 03/09/2013 1650   CHOLHDL 3 03/09/2013 1650   VLDL 16.8 03/09/2013 1650   LDLCALC 78 03/09/2013 1650      Wt Readings from Last 3 Encounters:  11/28/14 150 lb 1.9 oz (68.094 kg)  10/27/14 157 lb (71.215 kg)  07/30/14 156 lb (70.761 kg)         ASSESSMENT AND PLAN:  1. Ischemic heart  disease status post CABG in 2004. 2. history of previous aortic stenosis. Underwent aortic valve replacement in 2004 with a bioprosthetic valve. 3. status post stent graft repair of abdominal aortic aneurysm by Dr. Donnetta Hutching in December of 2014 4. Chronic sinus bradycardia with bifascicular block 5. benign hypertensive heart disease without heart failure 6. Osteoarthritis 7. easy bruising 8. Bilateral inguinal hernias 9.  Constipation  Recommendation: From a cardiac standpoint the patient is cleared for bilateral inguinal herniorrhaphy .  However, I told him that it would be important for him to get relief for his constipation prior to undergoing surgery.  He would not want to be straining to have a stool during the postoperative period.  For his constipation I suggested that he start taking MiraLAX every day.  He should also start Colace 200 mg daily.  He needs to drink more water. The patient will return in 4 months for follow-up office visit.  Recheck in 4 months for office visit and EKG. :   Current medicines are reviewed at length with the patient today.  The patient does not have concerns regarding medicines.  The following changes have been made:  Start taking MiraLAX daily and start taking Colace 200 mg daily  Labs/ tests ordered today include:  No orders of the defined types were placed in this encounter.      Disposition: The patient is cleared from a cardiac standpoint for inguinal hernia surgery.  He will need to have relief from his serious constipation problem prior to surgery. Recheck here for routine follow-up office visit in 4 months   Signed, [name] Coco, MD  11/28/2014 1:17 PM    [facility] [address], [place], [place]  [address] Phone: [phone]; Fax: [phone]

## 2014-12-05 ENCOUNTER — Telehealth: Payer: Self-pay | Admitting: Cardiology

## 2014-12-05 NOTE — Telephone Encounter (Signed)
Received request from Nurse fax box, documents faxed for surgical clearance. To: [facility] Fax number: [phone] Attention: 3.30.16/km

## 2015-01-04 ENCOUNTER — Encounter (HOSPITAL_COMMUNITY): Payer: Self-pay

## 2015-01-07 NOTE — Patient Instructions (Signed)
[name] [name]  01/07/2015   Your procedure is scheduled on:    01/15/2015    Report to [facility] Main  Entrance and follow signs to               [place] a     0700 AM.  Call this number if you have problems the morning of surgery [phone]   Remember:  Do not eat food or drink liquids :After Midnight.     Take these medicines the morning of surgery with A SIP OF WATER:  Norvasc, Eye drops, Claritin if needed,                               You may not have any metal on your body including hair pins and              piercings  Do not wear jewelry, , lotions, powders or perfumes., deodorant.               Do not wear nail polish.  Do not shave  48 hours prior to surgery.              Men may shave face and neck.   Do not bring valuables to the hospital. [name].  Contacts, dentures or bridgework may not be worn into surgery.      Patients discharged the day of surgery will not be allowed to drive home.  Name and phone number of your driver:  Special Instructions: coughing and deep breathing exercises, leg exercises               Please read over the following fact sheets you were given: _____________________________________________________________________             [facility] - Preparing for Surgery Before surgery, you can play an important role.  Because skin is not sterile, your skin needs to be as free of germs as possible.  You can reduce the number of germs on your skin by washing with CHG (chlorahexidine gluconate) soap before surgery.  CHG is an antiseptic cleaner which kills germs and bonds with the skin to continue killing germs even after washing. Please DO NOT use if you have an allergy to CHG or antibacterial soaps.  If your skin becomes reddened/irritated stop using the CHG and inform your nurse when you arrive at Short Stay. Do not shave (including legs and underarms) for at  least 48 hours prior to the first CHG shower.  You may shave your face/neck. Please follow these instructions carefully:  1.  Shower with CHG Soap the night before surgery and the  morning of Surgery.  2.  If you choose to wash your hair, wash your hair first as usual with your  normal  shampoo.  3.  After you shampoo, rinse your hair and body thoroughly to remove the  shampoo.                           4.  Use CHG as you would any other liquid soap.  You can apply chg directly  to the skin and wash  Gently with a scrungie or clean washcloth.  5.  Apply the CHG Soap to your body ONLY FROM THE NECK DOWN.   Do not use on face/ open                           Wound or open sores. Avoid contact with eyes, ears mouth and genitals (private parts).                       Wash face,  Genitals (private parts) with your normal soap.             6.  Wash thoroughly, paying special attention to the area where your surgery  will be performed.  7.  Thoroughly rinse your body with warm water from the neck down.  8.  DO NOT shower/wash with your normal soap after using and rinsing off  the CHG Soap.                9.  Pat yourself dry with a clean towel.            10.  Wear clean pajamas.            11.  Place clean sheets on your bed the night of your first shower and do not  sleep with pets. Day of Surgery : Do not apply any lotions/deodorants the morning of surgery.  Please wear clean clothes to the hospital/surgery center.  FAILURE TO FOLLOW THESE INSTRUCTIONS MAY RESULT IN THE CANCELLATION OF YOUR SURGERY PATIENT SIGNATURE_________________________________  NURSE SIGNATURE__________________________________  ________________________________________________________________________

## 2015-01-08 ENCOUNTER — Encounter (HOSPITAL_COMMUNITY)
Admission: RE | Admit: 2015-01-08 | Discharge: 2015-01-08 | Disposition: A | Discharge status: Home / Self Care | Payer: Medicare Other | Source: Ambulatory Visit | Attending: General Surgery | Admitting: General Surgery

## 2015-01-08 ENCOUNTER — Encounter (INDEPENDENT_AMBULATORY_CARE_PROVIDER_SITE_OTHER): Payer: Self-pay

## 2015-01-08 ENCOUNTER — Encounter (HOSPITAL_COMMUNITY): Payer: Self-pay

## 2015-01-08 DIAGNOSIS — Z01812 Encounter for preprocedural laboratory examination: Secondary | ICD-10-CM | POA: Insufficient documentation

## 2015-01-08 DIAGNOSIS — K4090 Unilateral inguinal hernia, without obstruction or gangrene, not specified as recurrent: Secondary | ICD-10-CM | POA: Diagnosis not present

## 2015-01-08 HISTORY — DX: Unspecified osteoarthritis, unspecified site: M19.90

## 2015-01-08 LAB — BASIC METABOLIC PANEL
Anion gap: 10 (ref 5–15)
BUN: 16 mg/dL (ref 6–20)
CHLORIDE: 101 mmol/L (ref 101–111)
CO2: 30 mmol/L (ref 22–32)
Calcium: 9.3 mg/dL (ref 8.9–10.3)
Creatinine, Ser: 0.99 mg/dL (ref 0.61–1.24)
GLUCOSE: 112 mg/dL — AB (ref 70–99)
POTASSIUM: 4.2 mmol/L (ref 3.5–5.1)
Sodium: 141 mmol/L (ref 135–145)

## 2015-01-08 LAB — CBC
HCT: 37.8 % — ABNORMAL LOW (ref 39.0–52.0)
Hemoglobin: 12 g/dL — ABNORMAL LOW (ref 13.0–17.0)
MCH: 30 pg (ref 26.0–34.0)
MCHC: 31.7 g/dL (ref 30.0–36.0)
MCV: 94.5 fL (ref 78.0–100.0)
PLATELETS: 185 10*3/uL (ref 150–400)
RBC: 4 MIL/uL — ABNORMAL LOW (ref 4.22–5.81)
RDW: 13.9 % (ref 11.5–15.5)
WBC: 7.1 10*3/uL (ref 4.0–10.5)

## 2015-01-08 NOTE — Progress Notes (Signed)
10/27/14- CXR- EPIC  07/30/14- EKG-  08/09/13-ECHO- EPIC  11/28/14- Cardiac clearance- Dr Mare Ferrari in [facility] office visit 08/09/13- Stress and ECHO- EPIC

## 2015-01-14 DIAGNOSIS — K4020 Bilateral inguinal hernia, without obstruction or gangrene, not specified as recurrent: Secondary | ICD-10-CM | POA: Diagnosis present

## 2015-01-14 DIAGNOSIS — Z87891 Personal history of nicotine dependence: Secondary | ICD-10-CM | POA: Diagnosis not present

## 2015-01-14 DIAGNOSIS — I10 Essential (primary) hypertension: Secondary | ICD-10-CM | POA: Diagnosis not present

## 2015-01-14 DIAGNOSIS — I509 Heart failure, unspecified: Secondary | ICD-10-CM | POA: Diagnosis not present

## 2015-01-14 DIAGNOSIS — Z952 Presence of prosthetic heart valve: Secondary | ICD-10-CM | POA: Diagnosis not present

## 2015-01-14 DIAGNOSIS — E780 Pure hypercholesterolemia: Secondary | ICD-10-CM | POA: Diagnosis not present

## 2015-01-14 NOTE — Anesthesia Preprocedure Evaluation (Addendum)
Anesthesia Evaluation  Patient identified by MRN, date of birth, ID band Patient awake    Reviewed: Allergy & Precautions, NPO status , Patient's Chart, lab work & pertinent test results  History of Anesthesia Complications (+) PONVNegative for: history of anesthetic complications  Airway Mallampati: II  TM Distance: >3 FB Neck ROM: Full    Dental  (+) Edentulous Upper, Poor Dentition, Dental Advisory Given   Pulmonary former smoker,    Pulmonary exam normal       Cardiovascular + CAD and + Peripheral Vascular Disease Normal cardiovascular exam+ dysrhythmias Supra Ventricular Tachycardia     Neuro/Psych negative neurological ROS  negative psych ROS   GI/Hepatic Neg liver ROS,   Endo/Other  negative endocrine ROS  Renal/GU negative Renal ROS     Musculoskeletal   Abdominal   Peds  Hematology   Anesthesia Other Findings   Reproductive/Obstetrics                            Anesthesia Physical Anesthesia Plan  ASA: III  Anesthesia Plan: General   Post-op Pain Management:    Induction: Intravenous  Airway Management Planned: Oral ETT  Additional Equipment:   Intra-op Plan:   Post-operative Plan: Extubation in OR  Informed Consent: I have reviewed the patients History and Physical, chart, labs and discussed the procedure including the risks, benefits and alternatives for the proposed anesthesia with the patient or authorized representative who has indicated his/her understanding and acceptance.   Dental advisory given  Plan Discussed with: CRNA, Anesthesiologist and Surgeon  Anesthesia Plan Comments:        Anesthesia Quick Evaluation

## 2015-01-15 ENCOUNTER — Encounter (HOSPITAL_COMMUNITY)
Admission: RE | Disposition: A | Discharge status: Home / Self Care | Payer: Self-pay | Source: Ambulatory Visit | Attending: General Surgery

## 2015-01-15 ENCOUNTER — Ambulatory Visit (HOSPITAL_COMMUNITY)
Admission: RE | Admit: 2015-01-15 | Discharge: 2015-01-15 | Disposition: A | Discharge status: Home / Self Care | Payer: Medicare Other | Source: Ambulatory Visit | Attending: General Surgery | Admitting: General Surgery

## 2015-01-15 ENCOUNTER — Ambulatory Visit (HOSPITAL_COMMUNITY): Payer: Medicare Other | Admitting: Anesthesiology

## 2015-01-15 ENCOUNTER — Encounter (HOSPITAL_COMMUNITY): Payer: Self-pay | Admitting: Certified Registered"

## 2015-01-15 DIAGNOSIS — I10 Essential (primary) hypertension: Secondary | ICD-10-CM | POA: Diagnosis not present

## 2015-01-15 DIAGNOSIS — Z87891 Personal history of nicotine dependence: Secondary | ICD-10-CM | POA: Insufficient documentation

## 2015-01-15 DIAGNOSIS — E780 Pure hypercholesterolemia: Secondary | ICD-10-CM | POA: Insufficient documentation

## 2015-01-15 DIAGNOSIS — Z952 Presence of prosthetic heart valve: Secondary | ICD-10-CM | POA: Insufficient documentation

## 2015-01-15 DIAGNOSIS — K4020 Bilateral inguinal hernia, without obstruction or gangrene, not specified as recurrent: Secondary | ICD-10-CM | POA: Insufficient documentation

## 2015-01-15 DIAGNOSIS — I509 Heart failure, unspecified: Secondary | ICD-10-CM | POA: Diagnosis not present

## 2015-01-15 HISTORY — PX: INGUINAL HERNIA REPAIR: SHX194

## 2015-01-15 SURGERY — REPAIR, HERNIA, INGUINAL, BILATERAL, LAPAROSCOPIC
Anesthesia: General | Site: Groin | Laterality: Bilateral

## 2015-01-15 MED ORDER — ROCURONIUM BROMIDE 100 MG/10ML IV SOLN
INTRAVENOUS | Status: DC | PRN
  Administered 2015-01-15: 20 mg via INTRAVENOUS

## 2015-01-15 MED ORDER — 0.9 % SODIUM CHLORIDE (POUR BTL) OPTIME
TOPICAL | Status: DC | PRN
  Administered 2015-01-15: 1000 mL

## 2015-01-15 MED ORDER — ONDANSETRON HCL 4 MG/2ML IJ SOLN
INTRAMUSCULAR | Status: AC
  Filled 2015-01-15: qty 2

## 2015-01-15 MED ORDER — PROPOFOL 10 MG/ML IV BOLUS
INTRAVENOUS | Status: DC | PRN
  Administered 2015-01-15: 100 mg via INTRAVENOUS

## 2015-01-15 MED ORDER — SODIUM CHLORIDE 0.9 % IJ SOLN: 3.0000 mL | Freq: Two times a day (BID) | INTRAMUSCULAR | Status: DC

## 2015-01-15 MED ORDER — GLYCOPYRROLATE 0.2 MG/ML IJ SOLN
INTRAMUSCULAR | Status: DC | PRN
Start: 2015-01-15 — End: 2015-01-15
  Administered 2015-01-15: 0.4 mg via INTRAVENOUS

## 2015-01-15 MED ORDER — ROCURONIUM BROMIDE 100 MG/10ML IV SOLN
INTRAVENOUS | Status: AC
  Filled 2015-01-15: qty 1

## 2015-01-15 MED ORDER — OXYCODONE HCL 5 MG PO TABS: 5.0000 mg | ORAL_TABLET | ORAL | Status: DC | PRN

## 2015-01-15 MED ORDER — HYDROMORPHONE HCL 1 MG/ML IJ SOLN: 0.2500 mg | INTRAMUSCULAR | Status: DC | PRN

## 2015-01-15 MED ORDER — CEFAZOLIN SODIUM-DEXTROSE 2-3 GM-% IV SOLR
INTRAVENOUS | Status: AC
  Filled 2015-01-15: qty 50

## 2015-01-15 MED ORDER — FENTANYL CITRATE (PF) 100 MCG/2ML IJ SOLN
INTRAMUSCULAR | Status: DC | PRN
  Administered 2015-01-15: 100 ug via INTRAVENOUS

## 2015-01-15 MED ORDER — DEXAMETHASONE SODIUM PHOSPHATE 10 MG/ML IJ SOLN
INTRAMUSCULAR | Status: DC | PRN
  Administered 2015-01-15: 10 mg via INTRAVENOUS

## 2015-01-15 MED ORDER — LACTATED RINGERS IV SOLN
INTRAVENOUS | Status: DC
Start: 2015-01-15 — End: 2015-01-15
  Administered 2015-01-15: 09:00:00 via INTRAVENOUS

## 2015-01-15 MED ORDER — FENTANYL CITRATE (PF) 100 MCG/2ML IJ SOLN
INTRAMUSCULAR | Status: AC
  Filled 2015-01-15: qty 2

## 2015-01-15 MED ORDER — PROMETHAZINE HCL 25 MG/ML IJ SOLN: 6.2500 mg | INTRAMUSCULAR | Status: DC | PRN

## 2015-01-15 MED ORDER — LIDOCAINE HCL (PF) 2 % IJ SOLN
INTRAMUSCULAR | Status: DC | PRN
Start: 2015-01-15 — End: 2015-01-15
  Administered 2015-01-15: 20 mg via INTRADERMAL

## 2015-01-15 MED ORDER — ACETAMINOPHEN 650 MG RE SUPP
650.0000 mg | RECTAL | Status: DC | PRN
  Filled 2015-01-15: qty 1

## 2015-01-15 MED ORDER — NEOSTIGMINE METHYLSULFATE 10 MG/10ML IV SOLN
INTRAVENOUS | Status: DC | PRN
  Administered 2015-01-15: 3 mg via INTRAVENOUS

## 2015-01-15 MED ORDER — CHLORHEXIDINE GLUCONATE 4 % EX LIQD: 1.0000 "application " | Freq: Once | CUTANEOUS | Status: DC

## 2015-01-15 MED ORDER — SODIUM CHLORIDE 0.9 % IJ SOLN: 3.0000 mL | INTRAMUSCULAR | Status: DC | PRN

## 2015-01-15 MED ORDER — DEXAMETHASONE SODIUM PHOSPHATE 10 MG/ML IJ SOLN
INTRAMUSCULAR | Status: AC
  Filled 2015-01-15: qty 1

## 2015-01-15 MED ORDER — CEFAZOLIN SODIUM-DEXTROSE 2-3 GM-% IV SOLR
2.0000 g | INTRAVENOUS | Status: AC
  Administered 2015-01-15: 2 g via INTRAVENOUS

## 2015-01-15 MED ORDER — LIDOCAINE HCL (CARDIAC) 20 MG/ML IV SOLN
INTRAVENOUS | Status: AC
  Filled 2015-01-15: qty 5

## 2015-01-15 MED ORDER — OXYCODONE-ACETAMINOPHEN 5-325 MG PO TABS: 1.0000 | ORAL_TABLET | ORAL | Status: DC | PRN

## 2015-01-15 MED ORDER — ONDANSETRON HCL 4 MG/2ML IJ SOLN
INTRAMUSCULAR | Status: DC | PRN
  Administered 2015-01-15: 4 mg via INTRAVENOUS

## 2015-01-15 MED ORDER — ACETAMINOPHEN 325 MG PO TABS: 650.0000 mg | ORAL_TABLET | ORAL | Status: DC | PRN

## 2015-01-15 MED ORDER — SODIUM CHLORIDE 0.9 % IV SOLN: 250.0000 mL | INTRAVENOUS | Status: DC | PRN

## 2015-01-15 MED ORDER — BUPIVACAINE-EPINEPHRINE 0.25% -1:200000 IJ SOLN
INTRAMUSCULAR | Status: DC | PRN
  Administered 2015-01-15: 9 mL

## 2015-01-15 MED ORDER — BUPIVACAINE-EPINEPHRINE 0.25% -1:200000 IJ SOLN
INTRAMUSCULAR | Status: AC
  Filled 2015-01-15: qty 1

## 2015-01-15 MED ORDER — PROPOFOL 10 MG/ML IV BOLUS
INTRAVENOUS | Status: AC
  Filled 2015-01-15: qty 20

## 2015-01-15 MED ORDER — SUCCINYLCHOLINE CHLORIDE 20 MG/ML IJ SOLN
INTRAMUSCULAR | Status: DC | PRN
  Administered 2015-01-15: 100 mg via INTRAVENOUS

## 2015-01-15 SURGICAL SUPPLY — 38 items
BAG URINE DRAINAGE (UROLOGICAL SUPPLIES) ×2 IMPLANT
BENZOIN TINCTURE PRP APPL 2/3 (GAUZE/BANDAGES/DRESSINGS) ×2 IMPLANT
CABLE HIGH FREQUENCY MONO STRZ (ELECTRODE) ×2 IMPLANT
CATH FOLEY 3WAY  5CC 16FR (CATHETERS) ×1
CATH FOLEY 3WAY 5CC 16FR (CATHETERS) ×1 IMPLANT
DECANTER SPIKE VIAL GLASS SM (MISCELLANEOUS) ×2 IMPLANT
DRAPE LAPAROSCOPIC ABDOMINAL (DRAPES) ×2 IMPLANT
DRSG TEGADERM 2-3/8X2-3/4 SM (GAUZE/BANDAGES/DRESSINGS) IMPLANT
ELECT REM PT RETURN 9FT ADLT (ELECTROSURGICAL) ×2
ELECTRODE REM PT RTRN 9FT ADLT (ELECTROSURGICAL) ×1 IMPLANT
ENDOLOOP SUT PDS II  0 18 (SUTURE) ×2
ENDOLOOP SUT PDS II 0 18 (SUTURE) ×2 IMPLANT
GAUZE SPONGE 2X2 8PLY STRL LF (GAUZE/BANDAGES/DRESSINGS) ×1 IMPLANT
GLOVE BIO SURGEON STRL SZ7.5 (GLOVE) ×2 IMPLANT
GOWN STRL REUS W/TWL XL LVL3 (GOWN DISPOSABLE) ×6 IMPLANT
KIT BASIN OR (CUSTOM PROCEDURE TRAY) ×2 IMPLANT
MESH 3DMAX 4X6 LT LRG (Mesh General) ×2 IMPLANT
MESH 3DMAX 4X6 RT LRG (Mesh General) ×2 IMPLANT
NEEDLE INSUFFLATION 14GA 120MM (NEEDLE) ×2 IMPLANT
NS IRRIG 1000ML POUR BTL (IV SOLUTION) ×2 IMPLANT
PLUG CATH AND CAP STER (CATHETERS) ×2 IMPLANT
RELOAD STAPLE HERNIA 4.0 BLUE (INSTRUMENTS) ×4 IMPLANT
SCISSORS LAP 5X35 DISP (ENDOMECHANICALS) IMPLANT
SET IRRIG TUBING LAPAROSCOPIC (IRRIGATION / IRRIGATOR) IMPLANT
SET IRRIG Y TYPE TUR BLADDER L (SET/KITS/TRAYS/PACK) ×2 IMPLANT
SOLUTION ANTI FOG 6CC (MISCELLANEOUS) IMPLANT
SPONGE GAUZE 2X2 STER 10/PKG (GAUZE/BANDAGES/DRESSINGS) ×1
STAPLER HERNIA 12 8.5 360D (INSTRUMENTS) ×2 IMPLANT
STRIP CLOSURE SKIN 1/2X4 (GAUZE/BANDAGES/DRESSINGS) ×2 IMPLANT
SUT MNCRL AB 4-0 PS2 18 (SUTURE) ×2 IMPLANT
TAPE CLOTH SURG 4X10 WHT LF (GAUZE/BANDAGES/DRESSINGS) ×2 IMPLANT
TOWEL OR 17X26 10 PK STRL BLUE (TOWEL DISPOSABLE) ×2 IMPLANT
TOWEL OR NON WOVEN STRL DISP B (DISPOSABLE) IMPLANT
TRAY FOLEY W/METER SILVER 14FR (SET/KITS/TRAYS/PACK) ×2 IMPLANT
TRAY LAPAROSCOPIC (CUSTOM PROCEDURE TRAY) ×2 IMPLANT
TROCAR CANNULA W/PORT DUAL 5MM (MISCELLANEOUS) ×2 IMPLANT
TROCAR XCEL 12X100 BLDLESS (ENDOMECHANICALS) ×2 IMPLANT
TUBING INSUFFLATION 10FT LAP (TUBING) IMPLANT

## 2015-01-15 NOTE — Op Note (Signed)
01/15/2015  10:13 AM  PATIENT:  [name] [name]  81 y.o. male  PRE-OPERATIVE DIAGNOSIS:  BILATERAL INGUINAL HERNIA  POST-OPERATIVE DIAGNOSIS:  BILATERAL DIRECT INGUINAL HERNIAS  PROCEDURE:  Procedure(s): LAPAROSCOPIC BILATERAL INGUINAL HERNIA REPAIR (Bilateral)  SURGEON:  Surgeon(s) and Role:    * Ralene Ok, MD - Primary  ANESTHESIA:   local and general  EBL:   <5CC  BLOOD ADMINISTERED:none  DRAINS: none   LOCAL MEDICATIONS USED:  BUPIVICAINE   SPECIMEN:  No Specimen  DISPOSITION OF SPECIMEN:  N/A  COUNTS:  YES  TOURNIQUET:  * No tourniquets in log *  DICTATION: .Dragon Dictation Details of the procedure: The patient was taken back to the operating room. The patient was placed in supine position with bilateral SCDs in place.  The patient was prepped and draped in the usual sterile fashion.  After appropriate anitbiotics were confirmed, a time-out was confirmed and all facts were verified.  0.25% Marcaine was used to infiltrate the umbilical area. A 11-blade was used to cut down the skin and blunt dissection was used to get the anterior fashion.  The anterior fascia was incised approximately 1 cm and the muscles were retracted laterally. Blunt dissection was then used to create a space in the preperitoneal area on the right side. At this time a 10 mm camera was then introduced into the space and advanced the pubic tubercle and a 12 mm trocar was placed over this and insufflation was started.  At this time and space was created from medial to laterally the preperitoneal space on the right side.  Cooper's ligament was initially cleaned off.  The hernia sac was identified in the right direct space. Dissection of the spermatic cord was undertaken the vas deferens was identified and protected in all parts of the case.  There was no large indirect hernia seen.  There was a small tear into the hernia sac. A Veress needle right upper quadrant to help evacuate the intraperitoneal air.  The transversalis fascia was everted and stapled to Cooper's ligament using a 4.67mm staple. Once the peritoneum, was taken down to approximately the umbilicus, a Bard 3D Max mesh was  introduced into the preperitoneal space.  The mesh was brought over the direct and indirect hernia spaces.  This was anchored into place and secured to Cooper's ligament with 4.47mm staples from a Coviden hernia stapler. It was anchored to the anterior abdominal wall with 4.8 mm staples. The hernia sac was seen lying posterior to the mesh. There was no staples placed laterally.   The exact same dissection took place on the left side.  There was a small indirect hernia and also large direct hernia.  Again the transversalis fascia was everted and it was stapled to Cooper's ligament using 4.32mm staple.  The Bard 3D max mesh was placed to cover both the direct and indirect spaces.  This was anchored into place and secured to Cooper's ligament with 4.48mm staples from a Coviden hernia stapler. It was anchored to the anterior abdominal wall with 4.8 mm staples. The hernia sac was seen lying posterior to the mesh. There was no staples placed laterally.   The insufflation was evacuated. The trochars were removed. The anterior fascia was reapproximated using #1 Vicryl on a UR- 6.  Intra-abdominal air was evacuated and the Veress needle removed. The skin was reapproximated using 4-0 Monocryl subcuticular fashion the patient was awakened from general anesthesia and taken to recovery in stable condition.   PLAN OF CARE: Discharge to  home after PACU  PATIENT DISPOSITION:  PACU - hemodynamically stable.   Delay start of Pharmacological VTE agent (>24hrs) due to surgical blood loss or risk of bleeding: not applicable

## 2015-01-15 NOTE — H&P (Signed)
History of Present Illness Ralene Ok MD; 11/08/2014 10:54 AM) The patient is a 81 year old male who presents with an inguinal hernia. Patient is an 81 year old male who is referred by Dr. Felipa Eth for evaluation of a right inguinal hernia. Patient states that this hernia is been there for at least 10+ years. The patient previously underwent cardiac surgery in 2002. He states since that time he is continue with hernia which bothers him that time. She states mainly it bothers him when constipated. The patient's had no signs or symptoms of incarceration or strangulation. The patient sees Dr. Mare Ferrari as his cardiologist.   Other Problems Elbert Ewings, CMA; 11/08/2014 10:02 AM) Arthritis Congestive Heart Failure High blood pressure Hypercholesterolemia Inguinal Hernia Vascular Disease  Past Surgical History Elbert Ewings, CMA; 11/08/2014 10:02 AM) Bypass Surgery for Poor Blood Flow to Legs Valve Replacement  Diagnostic Studies History Elbert Ewings, CMA; 11/08/2014 10:02 AM) Colonoscopy >10 years ago  Allergies Elbert Ewings, CMA; 11/08/2014 10:02 AM) No Known Drug Allergies03/11/2014  Medication History Elbert Ewings, CMA; 11/08/2014 10:03 AM) Alphagan P (0.1% Solution, Ophthalmic) Active. AmLODIPine Besylate (2.5MG  Tablet, Oral) Active. Crestor (5MG  Tablet, Oral) Active. Furosemide (80MG  Tablet, Oral) Active. Klor-Con M20 [facility] Tablet ER, Oral) Active. Lisinopril (5MG  Tablet, Oral) Active. Lumigan (0.01% Solution, Ophthalmic) Active. Medications Reconciled  Social History Elbert Ewings, [place]; 11/08/2014 10:02 AM) Alcohol use Remotely quit alcohol use. Caffeine use Coffee, Tea. No drug use Tobacco use Former smoker.  Family History Elbert Ewings, [place]; 11/08/2014 10:02 AM) Arthritis Mother, Sister. Heart Disease Brother. Hypertension Brother. Respiratory Condition Father.  Review of Systems Elbert Ewings CMA; 11/08/2014 10:02 AM) General Not Present- Appetite  Loss, Chills, Fatigue, Fever, Night Sweats, Weight Gain and Weight Loss. Skin Not Present- Change in Wart/Mole, Dryness, Hives, Jaundice, New Lesions, Non-Healing Wounds, Rash and Ulcer. HEENT Present- Wears glasses/contact lenses. Not Present- Earache, Hearing Loss, Hoarseness, Nose Bleed, Oral Ulcers, Ringing in the Ears, Seasonal Allergies, Sinus Pain, Sore Throat, Visual Disturbances and Yellow Eyes. Respiratory Not Present- Bloody sputum, Chronic Cough, Difficulty Breathing, Snoring and Wheezing. Breast Not Present- Breast Mass, Breast Pain, Nipple Discharge and Skin Changes. Cardiovascular Present- Leg Cramps. Not Present- Chest Pain, Difficulty Breathing Lying Down, Palpitations, Rapid Heart Rate, Shortness of Breath and Swelling of Extremities. Gastrointestinal Present- Constipation. Not Present- Abdominal Pain, Bloating, Bloody Stool, Change in Bowel Habits, Chronic diarrhea, Difficulty Swallowing, Excessive gas, Gets full quickly at meals, Hemorrhoids, Indigestion, Nausea, Rectal Pain and Vomiting. Male Genitourinary Not Present- Blood in Urine, Change in Urinary Stream, Frequency, Impotence, Nocturia, Painful Urination, Urgency and Urine Leakage.   Vitals Elbert Ewings CMA; 11/08/2014 10:03 AM) 11/08/2014 10:03 AM Weight: 152 lb Height: 68in Body Surface Area: 1.82 m Body Mass Index: 23.11 kg/m Temp.: 96.28F(Temporal)  Pulse: 57 (Regular)  Resp.: 15 (Unlabored)  BP: 126/72 (Sitting, Left Arm, Standard)    Physical Exam Ralene Ok MD; 11/08/2014 10:54 AM) General Mental Status-Alert. General Appearance-Consistent with stated age. Hydration-Well hydrated. Voice-Normal.  Head and Neck Head-normocephalic, atraumatic with no lesions or palpable masses. Trachea-midline. Thyroid Gland Characteristics - normal size and consistency.  Chest and Lung Exam Chest and lung exam reveals -quiet, even and easy respiratory effort with no use of accessory  muscles and on auscultation, normal breath sounds, no adventitious sounds and normal vocal resonance. Inspection Chest Wall - Normal. Back - normal.  Cardiovascular Cardiovascular examination reveals -normal heart sounds, regular rate and rhythm with no murmurs and normal pedal pulses bilaterally.  Abdomen Inspection Skin - Scar - no surgical  scars. Hernias - Inguinal hernia - Bilateral - Reducible(Right greater than left). Palpation/Percussion Normal exam - Soft, Non Tender, No Rebound tenderness, No Rigidity (guarding) and No hepatosplenomegaly. Auscultation Normal exam - Bowel sounds normal.    Assessment & Plan Ralene Ok MD; 11/08/2014 10:55 AM) BILATERAL INGUINAL HERNIA WITHOUT OBSTRUCTION OR GANGRENE, RECURRENCE NOT SPECIFIED (550.92  K40.20) Impression: 81 year old male with bilateral inguinal hernias, right greater than left.  1.  We'll proceed to the operating room after clearance for a laparoscopic bilateral inguinal hernia repair with mesh. 2. All risks and benefits were discussed with the patient to generally include, but not limited to: infection, bleeding, damage to surrounding structures, acute and chronic nerve pain, and recurrence. Alternatives were offered and described. All questions were answered and the patient voiced understanding of the procedure and wishes to proceed at this point with hernia repair.

## 2015-01-15 NOTE — Progress Notes (Signed)
PACU note-----pt's heart rate 42-44; Dr. Tobias Alexander , anesthes, notified and OK; no need for treatment; pt has history of bradycardia

## 2015-01-15 NOTE — Anesthesia Postprocedure Evaluation (Signed)
Anesthesia Post Note  Patient: [name] [name]  Procedure(s) Performed: Procedure(s) (LRB): LAPAROSCOPIC BILATERAL INGUINAL HERNIA REPAIR (Bilateral)  Anesthesia type: general  Patient location: PACU  Post pain: Pain level controlled  Post assessment: Patient's Cardiovascular Status Stable  Last Vitals:  Filed Vitals:   01/15/15 1025  BP: 153/58  Pulse: 48  Temp: 36.3 C  Resp: 12    Post vital signs: Reviewed and stable  Level of consciousness: sedated  Complications: No apparent anesthesia complications

## 2015-01-15 NOTE — Anesthesia Procedure Notes (Signed)
Procedure Name: Intubation Date/Time: 01/15/2015 8:55 AM Performed by: Lajuana Carry E Pre-anesthesia Checklist: Patient identified, Emergency Drugs available, Suction available and Patient being monitored Patient Re-evaluated:Patient Re-evaluated prior to inductionOxygen Delivery Method: Circle System Utilized Preoxygenation: Pre-oxygenation with 100% oxygen Intubation Type: IV induction Ventilation: Mask ventilation without difficulty Laryngoscope Size: Miller and 3 Grade View: Grade I Tube type: Oral Tube size: 7.0 mm Number of attempts: 2 (first attempt with mac 3 by paramedic student) Airway Equipment and Method: Stylet Placement Confirmation: ETT inserted through vocal cords under direct vision,  positive ETCO2 and breath sounds checked- equal and bilateral Secured at: 22 cm Tube secured with: Tape Dental Injury: Teeth and Oropharynx as per pre-operative assessment

## 2015-01-15 NOTE — Transfer of Care (Signed)
Immediate Anesthesia Transfer of Care Note  Patient: [name] [name]  Procedure(s) Performed: Procedure(s): LAPAROSCOPIC BILATERAL INGUINAL HERNIA REPAIR (Bilateral)  Patient Location: PACU  Anesthesia Type:General  Level of Consciousness:  sedated, patient cooperative and responds to stimulation  Airway & Oxygen Therapy:Patient Spontanous Breathing and Patient connected to face mask oxgen  Post-op Assessment:  Report given to PACU RN and Post -op Vital signs reviewed and stable  Post vital signs:  Reviewed and stable  Last Vitals:  Filed Vitals:   01/15/15 0656  BP: 150/59  Pulse: 58  Temp: 36.6 C  Resp: 18    Complications: No apparent anesthesia complications

## 2015-01-15 NOTE — Discharge Instructions (Signed)
[facility] _______[name] ° °INGUINAL HERNIA REPAIR: POST OP INSTRUCTIONS ° °Always review your discharge instruction sheet given to you by the facility where your surgery was performed. °IF YOU HAVE DISABILITY OR FAMILY LEAVE FORMS, YOU MUST BRING THEM TO THE OFFICE FOR PROCESSING.   °DO NOT GIVE THEM TO YOUR DOCTOR. ° °1. A  prescription for pain medication may be given to you upon discharge.  Take your pain medication as prescribed, if needed.  If narcotic pain medicine is not needed, then you may take acetaminophen (Tylenol) or ibuprofen (Advil) as needed. °2. Take your usually prescribed medications unless otherwise directed. °3. If you need a refill on your pain medication, please contact your pharmacy.  They will contact our office to request authorization. Prescriptions will not be filled after 5 pm or on week-ends. °4. You should follow a light diet the first 24 hours after arrival home, such as soup and crackers, etc.  Be sure to include lots of fluids daily.  Resume your normal diet the day after surgery. °5. Most patients will experience some swelling and bruising around the umbilicus or in the groin and scrotum.  Ice packs and reclining will help.  Swelling and bruising can take several days to resolve.  °6. It is common to experience some constipation if taking pain medication after surgery.  Increasing fluid intake and taking a stool softener (such as Colace) will usually help or prevent this problem from occurring.  A mild laxative (Milk of Magnesia or Miralax) should be taken according to package directions if there are no bowel movements after 48 hours. °7. Unless discharge instructions indicate otherwise, you may remove your bandages 24-48 hours after surgery, and you may shower at that time.  You may have steri-strips (small skin tapes) in place directly over the incision.  These strips should be left on the skin for 7-10 days.  If your surgeon used skin glue on the incision, you  may shower in 24 hours.  The glue will flake off over the next 2-3 weeks.  Any sutures or staples will be removed at the office during your follow-up visit. °8. ACTIVITIES:  You may resume regular (light) daily activities beginning the next day--such as daily self-care, walking, climbing stairs--gradually increasing activities as tolerated.  You may have sexual intercourse when it is comfortable.  Refrain from any heavy lifting or straining until approved by your doctor. °a. You may drive when you are no longer taking prescription pain medication, you can comfortably wear a seatbelt, and you can safely maneuver your car and apply brakes. °b. RETURN TO WORK:  __________________________________________________________ °9. You should see your doctor in the office for a follow-up appointment approximately 2-3 weeks after your surgery.  Make sure that you call for this appointment within a day or two after you arrive home to insure a convenient appointment time. °10. OTHER INSTRUCTIONS:  __________________________________________________________________________________________________________________________________________________________________________________________  °WHEN TO CALL YOUR DOCTOR: °1. Fever over 101.0 °2. Inability to urinate °3. Nausea and/or vomiting °4. Extreme swelling or bruising °5. Continued bleeding from incision. °6. Increased pain, redness, or drainage from the incision ° °The clinic staff is available to answer your questions during regular business hours.  Please don’t hesitate to call and ask to speak to one of the nurses for clinical concerns.  If you have a medical emergency, go to the nearest emergency room or call 911.  A surgeon from [facility] is always on call at the hospital ° ° °[address] [place], [place], [place]  [address] ?  [address], [place], [place]   [address] [phone] ? [phone] ? FAX [phone] Web site:  [email or web address]  General Anesthesia, Care After Refer to this sheet in the next few weeks. These instructions provide you with information on caring for yourself after your procedure. Your health care provider may also give you more specific instructions. Your treatment has been planned according to current medical practices, but problems sometimes occur. Call your health care provider if you have any problems or questions after your procedure. WHAT TO EXPECT AFTER THE PROCEDURE After the procedure, it is typical to experience:  Sleepiness.  Nausea and vomiting. HOME CARE INSTRUCTIONS  For the first 24 hours after general anesthesia:  Have a responsible person with you.  Do not drive a car. If you are alone, do not take public transportation.  Do not drink alcohol.  Do not take medicine that has not been prescribed by your health care provider.  Do not sign important papers or make important decisions.  You may resume a normal diet and activities as directed by your health care provider.  Change bandages (dressings) as directed.  If you have questions or problems that seem related to general anesthesia, call the hospital and ask for the anesthetist or anesthesiologist on call. SEEK MEDICAL CARE IF:  You have nausea and vomiting that continue the day after anesthesia.  You develop a rash. SEEK IMMEDIATE MEDICAL CARE IF:   You have difficulty breathing.  You have chest pain.  You have any allergic problems. Document Released: 11/30/2000 Document Revised: 08/29/2013 Document Reviewed: 03/09/2013 [facility] Patient Information 2015 [place], [place]. This information is not intended to replace advice given to you by your health care provider. Make sure you discuss any questions you have with your health care provider.

## 2015-01-16 ENCOUNTER — Encounter (HOSPITAL_COMMUNITY): Payer: Self-pay | Admitting: General Surgery

## 2015-04-04 ENCOUNTER — Encounter: Payer: Self-pay | Admitting: Vascular Surgery

## 2015-04-08 ENCOUNTER — Other Ambulatory Visit: Payer: Self-pay | Admitting: *Deleted

## 2015-04-08 DIAGNOSIS — Z01812 Encounter for preprocedural laboratory examination: Secondary | ICD-10-CM

## 2015-04-09 ENCOUNTER — Inpatient Hospital Stay: Admission: RE | Admit: 2015-04-09 | Payer: Medicare Other | Source: Ambulatory Visit

## 2015-04-09 ENCOUNTER — Ambulatory Visit: Payer: Medicare Other | Admitting: Vascular Surgery

## 2015-04-13 ENCOUNTER — Other Ambulatory Visit: Payer: Self-pay | Admitting: Adult Health

## 2015-04-17 ENCOUNTER — Other Ambulatory Visit: Payer: Self-pay | Admitting: *Deleted

## 2015-04-17 MED ORDER — POTASSIUM CHLORIDE CRYS ER 20 MEQ PO TBCR: EXTENDED_RELEASE_TABLET | ORAL | Status: DC

## 2015-04-19 ENCOUNTER — Other Ambulatory Visit: Payer: Self-pay | Admitting: Cardiology

## 2015-04-22 LAB — CREATININE, ISTAT: Creatinine, IStat: 1.1 mg/dL (ref 0.6–1.3)

## 2015-04-23 ENCOUNTER — Other Ambulatory Visit: Payer: Self-pay | Admitting: *Deleted

## 2015-04-23 ENCOUNTER — Ambulatory Visit
Admission: RE | Admit: 2015-04-23 | Discharge: 2015-04-23 | Disposition: A | Discharge status: Home / Self Care | Payer: Medicare Other | Source: Ambulatory Visit | Attending: Vascular Surgery | Admitting: Vascular Surgery

## 2015-04-23 DIAGNOSIS — Z48812 Encounter for surgical aftercare following surgery on the circulatory system: Secondary | ICD-10-CM

## 2015-04-23 DIAGNOSIS — I714 Abdominal aortic aneurysm, without rupture, unspecified: Secondary | ICD-10-CM

## 2015-04-23 MED ORDER — POTASSIUM CHLORIDE CRYS ER 20 MEQ PO TBCR: EXTENDED_RELEASE_TABLET | ORAL | Status: DC

## 2015-04-23 MED ORDER — FUROSEMIDE 80 MG PO TABS: 80.0000 mg | ORAL_TABLET | Freq: Every day | ORAL | Status: DC

## 2015-04-23 MED ORDER — IOPAMIDOL (ISOVUE-370) INJECTION 76%
75.0000 mL | Freq: Once | INTRAVENOUS | Status: AC | PRN
  Administered 2015-04-23: 75 mL via INTRAVENOUS

## 2015-04-23 NOTE — Telephone Encounter (Signed)
Spoke with patient and he lost his potassium after picking up on 04/18/15 New Rx sent to [facility] as requested

## 2015-04-30 ENCOUNTER — Ambulatory Visit: Payer: Medicare Other | Admitting: Vascular Surgery

## 2015-05-10 ENCOUNTER — Encounter: Payer: Self-pay | Admitting: Vascular Surgery

## 2015-05-14 ENCOUNTER — Encounter: Payer: Self-pay | Admitting: Vascular Surgery

## 2015-05-14 ENCOUNTER — Ambulatory Visit (INDEPENDENT_AMBULATORY_CARE_PROVIDER_SITE_OTHER): Payer: Medicare Other | Admitting: Vascular Surgery

## 2015-05-14 VITALS — BP 124/62 | HR 67 | Temp 97.6°F | Resp 18 | Ht 67.0 in | Wt 150.6 lb

## 2015-05-14 DIAGNOSIS — I714 Abdominal aortic aneurysm, without rupture, unspecified: Secondary | ICD-10-CM

## 2015-05-14 NOTE — Progress Notes (Signed)
HISTORY AND PHYSICAL     CC:  No complaints Referring Provider:  Lajean Manes, MD  HPI: This is a 81 y.o. male who underwent EVAR in December 2014 by Dr. Donnetta Hutching.  He states that he is doing well since his last visit.  He has undergone bilateral hernia repairs earlier this year by Dr. Gevena Cotton.  He states that he does get some cramping in his calves and feet at night, but not while walking.  He states that he is still on a statin, but had to change from Crestor to Lipitor due to insurance.  Otherwise, he states he has some arthritis in his knees, but is getting along well.  He is on an ACEI and CCB for blood pressure management.  He continues to take a daily aspirin.  Past Medical History  Diagnosis Date  . Aortic stenosis     s/p AVR in 2004  . Spasmodic torticollis     recent with persistant headaches  . Sinus bradycardia   . Hypercholesterolemia     On Simvastatin  . Coronary artery disease     s/p CABG in 2004  . Abnormal Holter monitor finding November 2012    Runs of SVT with profound bradycardia  . AAA (abdominal aortic aneurysm)   . Bifascicular block   . Right bundle branch block   . Arthritis     Past Surgical History  Procedure Laterality Date  . Coronary artery bypass graft  10-30-02    x8 per Dr. Cyndia Bent with LIMA to LAD, SVG to PD, SVG to 2nd DX/1st DX, & SVG to subbranch of the 1st DX & 1st OM  . Aortic valve replacement  10-30-02    insertion of a bioprosthetic #78mm AVR  . Abdominal aortic endovascular stent graft N/A 08/18/2013    Procedure: ABDOMINAL AORTIC ENDOVASCULAR STENT GRAFT;  Surgeon: Rosetta Posner, MD;  Location: [facility] OR;  Service: Vascular;  Laterality: N/A;  . Adeniod ectomy    . Inguinal hernia repair Bilateral 01/15/2015    Procedure: LAPAROSCOPIC BILATERAL INGUINAL HERNIA REPAIR;  Surgeon: Ralene Ok, MD;  Location: [facility] ORS;  Service: General;  Laterality: Bilateral;    No Known Allergies  Current Outpatient Prescriptions  Medication Sig  Dispense Refill  . acetaminophen (TYLENOL) 325 MG tablet Take 325 mg by mouth every 6 (six) hours as needed for mild pain.     Marland Kitchen amLODipine (NORVASC) 2.5 MG tablet Take 2.5-5 mg by mouth 2 (two) times daily. Takes 5mg  in morning and 2.5mg  at bedtime    . aspirin 81 MG tablet Take 81 mg by mouth every other day.    Marland Kitchen atorvastatin (LIPITOR) 10 MG tablet Take 10 mg by mouth daily.    . brimonidine (ALPHAGAN P) 0.1 % SOLN Place 1 drop into both eyes 2 (two) times daily.     Marland Kitchen docusate sodium (COLACE) 100 MG capsule Take 200 mg by mouth daily.    . furosemide (LASIX) 80 MG tablet Take 1 tablet (80 mg total) by mouth daily. 90 tablet 0  . guaiFENesin (MUCINEX) 600 MG 12 hr tablet Take 600 mg by mouth daily as needed for to loosen phlegm.    Marland Kitchen ibuprofen (ADVIL,MOTRIN) 200 MG tablet Take 200 mg by mouth every 6 (six) hours as needed for mild pain.    Marland Kitchen lisinopril (PRINIVIL,ZESTRIL) 5 MG tablet TAKE 1 TABLET (5 MG TOTAL) BY MOUTH DAILY. 90 tablet 2  . loratadine (CLARITIN) 10 MG tablet Take 10 mg by mouth daily  as needed for allergies.    Marland Kitchen LUMIGAN 0.01 % SOLN Place 1 drop into both eyes at bedtime.  3  . magnesium citrate SOLN Take 1 Bottle by mouth as needed for severe constipation.    Marland Kitchen oxyCODONE-acetaminophen (ROXICET) 5-325 MG per tablet Take 1-2 tablets by mouth every 4 (four) hours as needed. 30 tablet 0  . polyethylene glycol powder (GLYCOLAX/MIRALAX) powder Take 17 g by mouth daily.    . potassium chloride (KLOR-CON) 20 MEQ packet Take by mouth daily.    . potassium chloride SA (KLOR-CON M20) 20 MEQ tablet TAKE 1 TABLET (20 MEQ TOTAL) BY MOUTH DAILY. 90 tablet 1   No current facility-administered medications for this visit.    Family History  Problem Relation Age of Onset  . Hypertension Mother   . Hypertension Sister     Social History   Social History  . Marital Status: Widowed    Spouse Name: N/A  . Number of Children: N/A  . Years of Education: N/A   Occupational History  .  Not on file.   Social History Main Topics  . Smoking status: Former Smoker    Types: Cigarettes    Quit date: 08/15/1953  . Smokeless tobacco: Former Systems developer    Quit date: 08/15/1954  . Alcohol Use: No  . Drug Use: No  . Sexual Activity: Not on file   Other Topics Concern  . Not on file   Social History Narrative     ROS: [x]  Positive   [ ]  Negative   [ ]  All sytems reviewed and are negative  Cardiovascular: []  chest pain/pressure []  palpitations []  SOB lying flat []  DOE []  pain in legs while walking []  pain in feet when lying flat []  hx of DVT []  hx of phlebitis []  swelling in legs []  varicose veins  Pulmonary: []  productive cough []  asthma []  wheezing  Neurologic: []  weakness in []  arms []  legs []  numbness in []  arms []  legs [] difficulty speaking or slurred speech []  temporary loss of vision in one eye []  dizziness  Hematologic: []  bleeding problems []  problems with blood clotting easily  GI []  vomiting blood []  blood in stool [x]  bilateral hernia repairs earlier this year  GU: []  burning with urination []  blood in urine  Psychiatric: []  hx of major depression  Integumentary: []  rashes []  ulcers  Constitutional: []  fever []  chills   PHYSICAL EXAMINATION:  Filed Vitals:   05/14/15 1307  BP: 124/62  Pulse: 67  Temp: 97.6 F (36.4 C)  Resp: 18   Body mass index is 23.58 kg/(m^2).  General:  WDWN in NAD Gait: Not observed HENT: WNL, normocephalic Pulmonary: normal non-labored breathing , without Rales, rhonchi,  wheezing Cardiac: RRR, without  Murmurs, rubs or gallops; without carotid bruits Abdomen: soft, NT, no masses Skin: without rashes, without ulcers  Vascular Exam/Pulses:  Right Left  Radial 2+ (normal) 2+ (normal)  Femoral 2+ (normal) 2+ (normal)  Popliteal 2+ (normal) 2+ (normal)  DP 2+ (normal) 2+ (normal)   Extremities: without ischemic changes, without Gangrene , without cellulitis; without open wounds;    Musculoskeletal: no muscle wasting or atrophy  Neurologic: A&O X 3; Appropriate Affect ; SENSATION: normal; MOTOR FUNCTION:  moving all extremities equally. Speech is fluent/normal   Non-Invasive Vascular Imaging:   CTA 04/23/15: IMPRESSION: Slight decrease in excluded aneurysm sac size surrounding a normally patent endograft. No evidence of endoleak by CTA. 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  Pt meds includes: Statin:  Yes.   Beta Blocker:  No. Aspirin:  Yes.   ACEI:  Yes.   ARB:  No. Other Antiplatelet/Anticoagulant:  No.    ASSESSMENT/PLAN:: 81 y.o. male who is s/p EVAR in December 2014 by Dr. Donnetta Hutching   -pt does continue to do well -his CTA reveals no endoleak and the aneurysmal sac is slightly smaller from 5.9cm to 5.8cm -we will have him f/u in one year with the NP and duplex for EVAR protocol -he does have some night time cramps in his calves and feet, but this is most likely not related to arterial flow as he does have palpable DP pulses bilaterally.  If this persists, he can follow up with his PCP for possible referral to orthopedics.  -he will call sooner if needed   Leontine Locket, PA-C [facility] [phone]  Clinic MD:  Pt seen and examined in conjunction with Dr. Donnetta Hutching  I have examined the patient, reviewed and agree with above. Stable follow-up with stent graft of his aneurysm. No aneurysm regrowth or endoleak. Follow-up in one year with ultrasound  Curt Jews, MD 05/14/2015 1:51 PM

## 2015-05-15 NOTE — Addendum Note (Signed)
Addended by: Dorthula Rue L on: 05/15/2015 04:08 PM   Modules accepted: Orders

## 2015-06-11 DIAGNOSIS — E7800 Pure hypercholesterolemia, unspecified: Secondary | ICD-10-CM | POA: Insufficient documentation

## 2015-06-11 DIAGNOSIS — I2510 Atherosclerotic heart disease of native coronary artery without angina pectoris: Secondary | ICD-10-CM | POA: Insufficient documentation

## 2015-06-11 DIAGNOSIS — I739 Peripheral vascular disease, unspecified: Secondary | ICD-10-CM | POA: Insufficient documentation

## 2015-06-11 DIAGNOSIS — I10 Essential (primary) hypertension: Secondary | ICD-10-CM | POA: Insufficient documentation

## 2015-06-12 ENCOUNTER — Encounter: Payer: Self-pay | Admitting: Cardiology

## 2015-06-12 ENCOUNTER — Ambulatory Visit (INDEPENDENT_AMBULATORY_CARE_PROVIDER_SITE_OTHER): Payer: Medicare Other | Admitting: Cardiology

## 2015-06-12 VITALS — BP 150/68 | HR 53 | Ht 68.0 in | Wt 152.0 lb

## 2015-06-12 DIAGNOSIS — I119 Hypertensive heart disease without heart failure: Secondary | ICD-10-CM | POA: Diagnosis not present

## 2015-06-12 DIAGNOSIS — Z954 Presence of other heart-valve replacement: Secondary | ICD-10-CM

## 2015-06-12 DIAGNOSIS — Z951 Presence of aortocoronary bypass graft: Secondary | ICD-10-CM | POA: Diagnosis not present

## 2015-06-12 DIAGNOSIS — Z953 Presence of xenogenic heart valve: Secondary | ICD-10-CM

## 2015-06-12 MED ORDER — LISINOPRIL 5 MG PO TABS
ORAL_TABLET | ORAL | Status: DC
Start: 2015-06-12 — End: 2015-08-27

## 2015-06-12 NOTE — Progress Notes (Signed)
Cardiology Office Note   Date:  06/12/2015   ID:  [name] [name], DOB 1930/07/06, MRN [id number]  PCP:  Mathews Argyle, MD  Cardiologist: Darlin Coco MD  No chief complaint on file.     History of Present Illness: [name] [name] is a 81 y.o. male who presents for a six-month follow-up office visit  This pleasant 80 year old gentleman is seen for a followup office visit. He was seen in November 2014 in [facility] emergency room for back pain. An MRI revealed a previously unknown large abdominal aortic aneurysm measuring 5.4 x 5.3 cm. he underwent preoperative risk assessment with echocardiogram and nuclear stress test. His echocardiogram on 08/09/13 showed good bioprosthetic aortic valve function and his ejection fraction was 60-65%. His Lexi scan Myoview stress test showed ejection fraction of 58% and there was a moderate sized old inferior wall scar with minimal peri-infarct ischemia. He went on to have a successful treatment as is 5.5 cm abdominal aortic aneurysm by Dr. Sherren Mocha early on 08/18/13 treated with endovascular stent graft. He has a history of previous aortic stenosis. In 2004 he underwent surgery for aortic valve stenosis with insertion of a bioprosthetic valve. At the same time he underwent coronary artery bypass graft surgery x5 .The patient has done well postoperatively. The patient has a history of marked chronic intrinsic sinus bradycardia with bifascicular block. He has not had complete heart block. He has had occasional mild dizziness. No syncope. His last visit he has continued to do well. He denies any chest pain. He remains physically active.  Since last visit he has been having some arthritic complaints in his left leg. He has noted some easy bruising. This has improved since he cut back on his baby aspirin to just every other day. Since last visit the patient underwent successful outpatient laparoscopic surgery for bilateral inguinal hernias.  Dr.  Rosendo Gros is his surgeon. The patient received new hearing aids at the [facility] and he is pleased with how they are working.  Past Medical History  Diagnosis Date  . Aortic stenosis     s/p AVR in 2004  . Spasmodic torticollis     recent with persistant headaches  . Sinus bradycardia   . Hypercholesterolemia     On Simvastatin  . Coronary artery disease     s/p CABG in 2004  . Abnormal Holter monitor finding November 2012    Runs of SVT with profound bradycardia  . AAA (abdominal aortic aneurysm) ([place])   . Bifascicular block   . Right bundle branch block   . Arthritis     Past Surgical History  Procedure Laterality Date  . Coronary artery bypass graft  10-30-02    x8 per Dr. Cyndia Bent with LIMA to LAD, SVG to PD, SVG to 2nd DX/1st DX, & SVG to subbranch of the 1st DX & 1st OM  . Aortic valve replacement  10-30-02    insertion of a bioprosthetic #81mm AVR  . Abdominal aortic endovascular stent graft N/A 08/18/2013    Procedure: ABDOMINAL AORTIC ENDOVASCULAR STENT GRAFT;  Surgeon: Rosetta Posner, MD;  Location: [facility] OR;  Service: Vascular;  Laterality: N/A;  . Adeniod ectomy    . Inguinal hernia repair Bilateral 01/15/2015    Procedure: LAPAROSCOPIC BILATERAL INGUINAL HERNIA REPAIR;  Surgeon: Ralene Ok, MD;  Location: [facility] ORS;  Service: General;  Laterality: Bilateral;     Current Outpatient Prescriptions  Medication Sig Dispense Refill  . acetaminophen (TYLENOL) 325 MG tablet Take 325  mg by mouth every 6 (six) hours as needed for mild pain.     Marland Kitchen amLODipine (NORVASC) 2.5 MG tablet Take 2.5-5 mg by mouth 2 (two) times daily. Takes 5mg  in morning and 2.5mg  at bedtime    . aspirin 81 MG tablet Take 81 mg by mouth every other day.    Marland Kitchen atorvastatin (LIPITOR) 10 MG tablet Take 10 mg by mouth daily.    . brimonidine (ALPHAGAN P) 0.1 % SOLN Place 1 drop into both eyes 2 (two) times daily.     Marland Kitchen docusate sodium (COLACE) 100 MG capsule Take 200 mg by mouth daily.    . furosemide  (LASIX) 80 MG tablet Take 1 tablet (80 mg total) by mouth daily. 90 tablet 0  . guaiFENesin (MUCINEX) 600 MG 12 hr tablet Take 600 mg by mouth daily as needed for to loosen phlegm.    Marland Kitchen ibuprofen (ADVIL,MOTRIN) 200 MG tablet Take 200 mg by mouth every 6 (six) hours as needed (pain).    Marland Kitchen lisinopril (PRINIVIL,ZESTRIL) 5 MG tablet TAKE 1 TABLET (5 MG TOTAL) BY MOUTH DAILY. 90 tablet 3  . loratadine (CLARITIN) 10 MG tablet Take 10 mg by mouth daily as needed for allergies.    Marland Kitchen LUMIGAN 0.01 % SOLN Place 1 drop into both eyes at bedtime.  3  . magnesium citrate SOLN Take 1 Bottle by mouth as needed for severe constipation.    . polyethylene glycol powder (GLYCOLAX/MIRALAX) powder Take 17 g by mouth daily.    . potassium chloride SA (KLOR-CON M20) 20 MEQ tablet TAKE 1 TABLET (20 MEQ TOTAL) BY MOUTH DAILY. 90 tablet 1   No current facility-administered medications for this visit.    Allergies:   Review of patient's allergies indicates no known allergies.    Social History:  The patient  reports that he quit smoking about 61 years ago. His smoking use included Cigarettes. He quit smokeless tobacco use about 60 years ago. He reports that he does not drink alcohol or use illicit drugs.   Family History:  The patient's family history includes Hypertension in his mother and sister.    ROS:  Please see the history of present illness.   Otherwise, review of systems are positive for none.   All other systems are reviewed and negative.    PHYSICAL EXAM: VS:  BP 150/68 mmHg  Pulse 53  Ht 5\' 8"  (1.727 m)  Wt 152 lb (68.947 kg)  BMI 23.12 kg/m2 , BMI Body mass index is 23.12 kg/(m^2). GEN: Well nourished, well developed, in no acute distress HEENT: normal Neck: no JVD, carotid bruits, or masses Cardiac: RRR; no murmurs, rubs, or gallops,no edema  Respiratory:  clear to auscultation bilaterally, normal work of breathing GI: soft, nontender, nondistended, + BS.  The incisions from the laparoscopic  herniorrhaphy are well healed MS: no deformity or atrophy Skin: warm and dry, no rash Neuro:  Strength and sensation are intact Psych: euthymic mood, full affect   EKG:  EKG is ordered today. The ekg ordered today demonstrates sinus bradycardia at 53 bpm.  Right bundle branch block with left anterior hemiblock.  Since previous tracing of 07/30/14, no significant change.   Recent Labs: 10/27/2014: ALT 15 01/08/2015: BUN 16; Creatinine, Ser 0.99; Hemoglobin 12.0*; Platelets 185; Potassium 4.2; Sodium 141    Lipid Panel    Component Value Date/Time   CHOL 138 03/09/2013 1650   TRIG 84.0 03/09/2013 1650   HDL 43.30 03/09/2013 1650   CHOLHDL 3 03/09/2013  1650   VLDL 16.8 03/09/2013 1650   LDLCALC 78 03/09/2013 1650      Wt Readings from Last 3 Encounters:  06/12/15 152 lb (68.947 kg)  05/14/15 150 lb 9.6 oz (68.312 kg)  01/15/15 154 lb (69.854 kg)        ASSESSMENT AND PLAN:  1. Ischemic heart disease status post CABG in 2004. 2. history of previous aortic stenosis. Underwent aortic valve replacement in 2004 with a bioprosthetic valve. 3. status post stent graft repair of abdominal aortic aneurysm by Dr. Donnetta Hutching in December of 2014 4. Chronic sinus bradycardia with bifascicular block 5. benign hypertensive heart disease without heart failure 6. Osteoarthritis 7. easy bruising 8. status post repair of Bilateral inguinal hernias by Dr. Rosendo Gros on 01/15/15 9. Constipation   Current medicines are reviewed at length with the patient today.  The patient does not have concerns regarding medicines.  The following changes have been made:  no change  Labs/ tests ordered today include:   Orders Placed This Encounter  Procedures  . EKG 12-Lead    Disposition: Continue on current medication.  We refilled his lisinopril today.  He'll be rechecked in 6 months for follow-up office visit.  Berna Spare MD 06/12/2015 5:45 PM    [address], [place], [place]  [id number] Phone: [phone]; Fax: [phone]

## 2015-06-12 NOTE — Patient Instructions (Signed)
Medication Instructions:  Your physician recommends that you continue on your current medications as directed. Please refer to the Current Medication list given to you today.  Labwork: NONE  Testing/Procedures: NONE  Follow-Up: Your physician wants you to follow-up in:  [place] NP OR [facility]OTT [facility] PA

## 2015-07-22 ENCOUNTER — Other Ambulatory Visit: Payer: Self-pay

## 2015-07-22 MED ORDER — FUROSEMIDE 80 MG PO TABS: 80.0000 mg | ORAL_TABLET | Freq: Every day | ORAL | Status: DC

## 2015-07-22 NOTE — Telephone Encounter (Signed)
Pt called to get a refill for Lasix 80 mg gave a year supply

## 2015-08-21 ENCOUNTER — Inpatient Hospital Stay (HOSPITAL_COMMUNITY)
Admission: EM | Admit: 2015-08-21 | Discharge: 2015-08-27 | DRG: 242 | Disposition: A | Discharge status: Home Health | Payer: Medicare Other | Attending: Cardiovascular Disease | Admitting: Cardiovascular Disease

## 2015-08-21 ENCOUNTER — Emergency Department (HOSPITAL_COMMUNITY): Payer: Medicare Other

## 2015-08-21 ENCOUNTER — Inpatient Hospital Stay (HOSPITAL_COMMUNITY): Payer: Medicare Other

## 2015-08-21 ENCOUNTER — Encounter (HOSPITAL_COMMUNITY): Payer: Self-pay

## 2015-08-21 ENCOUNTER — Other Ambulatory Visit: Payer: Self-pay

## 2015-08-21 ENCOUNTER — Encounter (HOSPITAL_COMMUNITY)
Admission: EM | Disposition: A | Discharge status: Home Health | Payer: Self-pay | Source: Home / Self Care | Attending: Cardiovascular Disease

## 2015-08-21 DIAGNOSIS — D62 Acute posthemorrhagic anemia: Secondary | ICD-10-CM

## 2015-08-21 DIAGNOSIS — R402411 Glasgow coma scale score 13-15, in the field [EMT or ambulance]: Secondary | ICD-10-CM | POA: Diagnosis present

## 2015-08-21 DIAGNOSIS — J9601 Acute respiratory failure with hypoxia: Secondary | ICD-10-CM | POA: Diagnosis present

## 2015-08-21 DIAGNOSIS — Z953 Presence of xenogenic heart valve: Secondary | ICD-10-CM

## 2015-08-21 DIAGNOSIS — Z95828 Presence of other vascular implants and grafts: Secondary | ICD-10-CM | POA: Diagnosis not present

## 2015-08-21 DIAGNOSIS — Z79899 Other long term (current) drug therapy: Secondary | ICD-10-CM | POA: Diagnosis not present

## 2015-08-21 DIAGNOSIS — Z87891 Personal history of nicotine dependence: Secondary | ICD-10-CM

## 2015-08-21 DIAGNOSIS — N179 Acute kidney failure, unspecified: Secondary | ICD-10-CM | POA: Diagnosis present

## 2015-08-21 DIAGNOSIS — S27329A Contusion of lung, unspecified, initial encounter: Secondary | ICD-10-CM | POA: Diagnosis present

## 2015-08-21 DIAGNOSIS — I469 Cardiac arrest, cause unspecified: Secondary | ICD-10-CM | POA: Diagnosis present

## 2015-08-21 DIAGNOSIS — Z452 Encounter for adjustment and management of vascular access device: Secondary | ICD-10-CM | POA: Insufficient documentation

## 2015-08-21 DIAGNOSIS — Z951 Presence of aortocoronary bypass graft: Secondary | ICD-10-CM

## 2015-08-21 DIAGNOSIS — I4510 Unspecified right bundle-branch block: Secondary | ICD-10-CM | POA: Diagnosis present

## 2015-08-21 DIAGNOSIS — I452 Bifascicular block: Secondary | ICD-10-CM | POA: Diagnosis present

## 2015-08-21 DIAGNOSIS — Z7982 Long term (current) use of aspirin: Secondary | ICD-10-CM | POA: Diagnosis not present

## 2015-08-21 DIAGNOSIS — E873 Alkalosis: Secondary | ICD-10-CM | POA: Diagnosis not present

## 2015-08-21 DIAGNOSIS — R7989 Other specified abnormal findings of blood chemistry: Secondary | ICD-10-CM | POA: Diagnosis not present

## 2015-08-21 DIAGNOSIS — Z95818 Presence of other cardiac implants and grafts: Secondary | ICD-10-CM

## 2015-08-21 DIAGNOSIS — R001 Bradycardia, unspecified: Secondary | ICD-10-CM | POA: Diagnosis present

## 2015-08-21 DIAGNOSIS — T1490XA Injury, unspecified, initial encounter: Secondary | ICD-10-CM

## 2015-08-21 DIAGNOSIS — E785 Hyperlipidemia, unspecified: Secondary | ICD-10-CM | POA: Diagnosis present

## 2015-08-21 DIAGNOSIS — S0181XA Laceration without foreign body of other part of head, initial encounter: Secondary | ICD-10-CM | POA: Diagnosis present

## 2015-08-21 DIAGNOSIS — Z9289 Personal history of other medical treatment: Secondary | ICD-10-CM

## 2015-08-21 DIAGNOSIS — R579 Shock, unspecified: Secondary | ICD-10-CM | POA: Diagnosis present

## 2015-08-21 DIAGNOSIS — D649 Anemia, unspecified: Secondary | ICD-10-CM | POA: Diagnosis present

## 2015-08-21 DIAGNOSIS — I459 Conduction disorder, unspecified: Secondary | ICD-10-CM | POA: Diagnosis not present

## 2015-08-21 DIAGNOSIS — I2510 Atherosclerotic heart disease of native coronary artery without angina pectoris: Secondary | ICD-10-CM | POA: Diagnosis present

## 2015-08-21 DIAGNOSIS — I442 Atrioventricular block, complete: Secondary | ICD-10-CM | POA: Diagnosis present

## 2015-08-21 DIAGNOSIS — G9341 Metabolic encephalopathy: Secondary | ICD-10-CM | POA: Diagnosis present

## 2015-08-21 DIAGNOSIS — R748 Abnormal levels of other serum enzymes: Secondary | ICD-10-CM | POA: Diagnosis present

## 2015-08-21 DIAGNOSIS — R55 Syncope and collapse: Secondary | ICD-10-CM | POA: Diagnosis not present

## 2015-08-21 HISTORY — PX: CARDIAC CATHETERIZATION: SHX172

## 2015-08-21 HISTORY — DX: Atrioventricular block, complete: I44.2

## 2015-08-21 LAB — COMPREHENSIVE METABOLIC PANEL
ALT: 22 U/L (ref 17–63)
ANION GAP: 12 (ref 5–15)
AST: 32 U/L (ref 15–41)
Albumin: 4.2 g/dL (ref 3.5–5.0)
Alkaline Phosphatase: 55 U/L (ref 38–126)
BUN: 27 mg/dL — ABNORMAL HIGH (ref 6–20)
CHLORIDE: 99 mmol/L — AB (ref 101–111)
CO2: 29 mmol/L (ref 22–32)
CREATININE: 1.54 mg/dL — AB (ref 0.61–1.24)
Calcium: 9.2 mg/dL (ref 8.9–10.3)
GFR, EST AFRICAN AMERICAN: 46 mL/min — AB (ref >60)
GFR, EST NON AFRICAN AMERICAN: 40 mL/min — AB (ref >60)
Glucose, Bld: 161 mg/dL — ABNORMAL HIGH (ref 65–99)
POTASSIUM: 4.1 mmol/L (ref 3.5–5.1)
SODIUM: 140 mmol/L (ref 135–145)
Total Bilirubin: 0.7 mg/dL (ref 0.3–1.2)
Total Protein: 7 g/dL (ref 6.5–8.1)

## 2015-08-21 LAB — CBC
HEMATOCRIT: 40.2 % (ref 39.0–52.0)
HEMOGLOBIN: 13.5 g/dL (ref 13.0–17.0)
MCH: 31.5 pg (ref 26.0–34.0)
MCHC: 33.6 g/dL (ref 30.0–36.0)
MCV: 93.9 fL (ref 78.0–100.0)
PLATELETS: 178 10*3/uL (ref 150–400)
RBC: 4.28 MIL/uL (ref 4.22–5.81)
RDW: 14.3 % (ref 11.5–15.5)
WBC: 8.7 10*3/uL (ref 4.0–10.5)

## 2015-08-21 LAB — I-STAT CG4 LACTIC ACID, ED: LACTIC ACID, VENOUS: 2.82 mmol/L — AB (ref 0.5–2.0)

## 2015-08-21 LAB — URINALYSIS, ROUTINE W REFLEX MICROSCOPIC
Bilirubin Urine: NEGATIVE
GLUCOSE, UA: NEGATIVE mg/dL
HGB URINE DIPSTICK: NEGATIVE
Ketones, ur: NEGATIVE mg/dL
Leukocytes, UA: NEGATIVE
Nitrite: NEGATIVE
Protein, ur: 30 mg/dL — AB
SPECIFIC GRAVITY, URINE: 1.016 (ref 1.005–1.030)
pH: 6 (ref 5.0–8.0)

## 2015-08-21 LAB — PROTIME-INR
INR: 1.09 (ref 0.00–1.49)
Prothrombin Time: 14.3 seconds (ref 11.6–15.2)

## 2015-08-21 LAB — I-STAT ARTERIAL BLOOD GAS, ED
ACID-BASE EXCESS: 1 mmol/L (ref 0.0–2.0)
BICARBONATE: 27.4 meq/L — AB (ref 20.0–24.0)
O2 Saturation: 100 %
PO2 ART: 364 mmHg — AB (ref 80.0–100.0)
TCO2: 29 mmol/L (ref 0–100)
pCO2 arterial: 55.3 mmHg — ABNORMAL HIGH (ref 35.0–45.0)
pH, Arterial: 7.311 — ABNORMAL LOW (ref 7.350–7.450)

## 2015-08-21 LAB — URINE MICROSCOPIC-ADD ON

## 2015-08-21 LAB — I-STAT CHEM 8, ED
BUN: 34 mg/dL — AB (ref 6–20)
CHLORIDE: 96 mmol/L — AB (ref 101–111)
CREATININE: 1.6 mg/dL — AB (ref 0.61–1.24)
Calcium, Ion: 1.1 mmol/L — ABNORMAL LOW (ref 1.13–1.30)
GLUCOSE: 158 mg/dL — AB (ref 65–99)
HCT: 42 % (ref 39.0–52.0)
Hemoglobin: 14.3 g/dL (ref 13.0–17.0)
POTASSIUM: 3.9 mmol/L (ref 3.5–5.1)
Sodium: 140 mmol/L (ref 135–145)
TCO2: 31 mmol/L (ref 0–100)

## 2015-08-21 LAB — LIPASE, BLOOD: LIPASE: 28 U/L (ref 11–51)

## 2015-08-21 LAB — TYPE AND SCREEN
ABO/RH(D): A POS
Antibody Screen: NEGATIVE

## 2015-08-21 LAB — I-STAT TROPONIN, ED: TROPONIN I, POC: 0 ng/mL (ref 0.00–0.08)

## 2015-08-21 LAB — TROPONIN I

## 2015-08-21 SURGERY — TEMPORARY PACEMAKER

## 2015-08-21 MED ORDER — ATROPINE SULFATE 1 MG/ML IJ SOLN
INTRAMUSCULAR | Status: AC | PRN
  Administered 2015-08-21 (×2): .5 mg via INTRAVENOUS

## 2015-08-21 MED ORDER — ATROPINE SULFATE 0.1 MG/ML IJ SOLN
INTRAMUSCULAR | Status: AC
  Filled 2015-08-21: qty 30

## 2015-08-21 MED ORDER — SODIUM CHLORIDE 0.9 % IV SOLN
25.0000 ug/h | INTRAVENOUS | Status: DC
  Administered 2015-08-21: 50 ug/h via INTRAVENOUS
  Filled 2015-08-21: qty 50

## 2015-08-21 MED ORDER — SODIUM CHLORIDE 0.9 % IV SOLN
250.0000 mL | INTRAVENOUS | Status: DC | PRN
  Administered 2015-08-23 (×2): 250 mL via INTRAVENOUS

## 2015-08-21 MED ORDER — SODIUM CHLORIDE 0.9 % IV SOLN
INTRAVENOUS | Status: DC
Start: 2015-08-21 — End: 2015-08-27
  Administered 2015-08-21: 23:00:00 via INTRAVENOUS
  Administered 2015-08-23: 100 mL via INTRAVENOUS
  Administered 2015-08-23: 09:00:00 via INTRAVENOUS
  Administered 2015-08-24: 100 mL/h via INTRAVENOUS
  Administered 2015-08-25 (×2): via INTRAVENOUS

## 2015-08-21 MED ORDER — ETOMIDATE 2 MG/ML IV SOLN
INTRAVENOUS | Status: AC | PRN
  Administered 2015-08-21: 20 mg via INTRAVENOUS

## 2015-08-21 MED ORDER — BISACODYL 10 MG RE SUPP: 10.0000 mg | Freq: Every day | RECTAL | Status: DC | PRN

## 2015-08-21 MED ORDER — FENTANYL CITRATE (PF) 100 MCG/2ML IJ SOLN
INTRAMUSCULAR | Status: AC
  Filled 2015-08-21: qty 2

## 2015-08-21 MED ORDER — ATROPINE SULFATE 1 MG/ML IJ SOLN
INTRAMUSCULAR | Status: AC | PRN
  Administered 2015-08-21: .5 mg via INTRAVENOUS

## 2015-08-21 MED ORDER — ATROPINE SULFATE 0.1 MG/ML IJ SOLN
0.5000 mg | Freq: Once | INTRAMUSCULAR | Status: AC
  Administered 2015-08-21: 0.5 mg via INTRAVENOUS

## 2015-08-21 MED ORDER — DOPAMINE-DEXTROSE 3.2-5 MG/ML-% IV SOLN
0.0000 ug/kg/min | Freq: Once | INTRAVENOUS | Status: AC
  Administered 2015-08-21: 5 ug/kg/min via INTRAVENOUS

## 2015-08-21 MED ORDER — DOCUSATE SODIUM 50 MG/5ML PO LIQD: 100.0000 mg | Freq: Two times a day (BID) | ORAL | Status: DC | PRN

## 2015-08-21 MED ORDER — MIDAZOLAM HCL 2 MG/2ML IJ SOLN
INTRAMUSCULAR | Status: AC
  Administered 2015-08-21: 1 mg via INTRAVENOUS
  Filled 2015-08-21: qty 2

## 2015-08-21 MED ORDER — SUCCINYLCHOLINE CHLORIDE 20 MG/ML IJ SOLN
INTRAMUSCULAR | Status: DC | PRN
  Administered 2015-08-21: 100 mg via INTRAVENOUS

## 2015-08-21 MED ORDER — CHLORHEXIDINE GLUCONATE 0.12% ORAL RINSE (MEDLINE KIT)
15.0000 mL | Freq: Two times a day (BID) | OROMUCOSAL | Status: DC
  Administered 2015-08-22 – 2015-08-24 (×6): 15 mL via OROMUCOSAL

## 2015-08-21 MED ORDER — DOPAMINE-DEXTROSE 3.2-5 MG/ML-% IV SOLN
0.0000 ug/kg/min | INTRAVENOUS | Status: DC
  Administered 2015-08-22: 18 ug/kg/min via INTRAVENOUS
  Filled 2015-08-21: qty 250

## 2015-08-21 MED ORDER — ONDANSETRON HCL 4 MG/2ML IJ SOLN
4.0000 mg | Freq: Once | INTRAMUSCULAR | Status: AC
  Administered 2015-08-21: 4 mg via INTRAVENOUS

## 2015-08-21 MED ORDER — MIDAZOLAM HCL 2 MG/2ML IJ SOLN
INTRAMUSCULAR | Status: AC
  Filled 2015-08-21: qty 2

## 2015-08-21 MED ORDER — ATROPINE SULFATE 0.1 MG/ML IJ SOLN
INTRAMUSCULAR | Status: AC
  Filled 2015-08-21: qty 20

## 2015-08-21 MED ORDER — BRIMONIDINE TARTRATE 0.15 % OP SOLN
1.0000 [drp] | Freq: Two times a day (BID) | OPHTHALMIC | Status: DC
  Administered 2015-08-23 – 2015-08-25 (×7): 1 [drp] via OPHTHALMIC
  Filled 2015-08-21 (×3): qty 5

## 2015-08-21 MED ORDER — EPINEPHRINE HCL 0.1 MG/ML IJ SOSY
PREFILLED_SYRINGE | INTRAMUSCULAR | Status: AC | PRN
  Administered 2015-08-21: 1 mg via INTRAVENOUS

## 2015-08-21 MED ORDER — ANTISEPTIC ORAL RINSE SOLUTION (CORINZ)
7.0000 mL | Freq: Four times a day (QID) | OROMUCOSAL | Status: DC
  Administered 2015-08-22 – 2015-08-27 (×14): 7 mL via OROMUCOSAL

## 2015-08-21 MED ORDER — SODIUM CHLORIDE 0.9 % IV BOLUS (SEPSIS)
500.0000 mL | Freq: Once | INTRAVENOUS | Status: AC
Start: 2015-08-21 — End: 2015-08-21
  Administered 2015-08-21: 500 mL via INTRAVENOUS

## 2015-08-21 MED ORDER — SODIUM CHLORIDE 0.9 % IV BOLUS (SEPSIS)
1000.0000 mL | Freq: Once | INTRAVENOUS | Status: AC
  Administered 2015-08-21: 1000 mL via INTRAVENOUS

## 2015-08-21 MED ORDER — FENTANYL CITRATE (PF) 100 MCG/2ML IJ SOLN
50.0000 ug | Freq: Once | INTRAMUSCULAR | Status: AC
  Administered 2015-08-21: 50 ug via INTRAVENOUS

## 2015-08-21 MED ORDER — MIDAZOLAM HCL 2 MG/2ML IJ SOLN
1.0000 mg | INTRAMUSCULAR | Status: AC | PRN
  Administered 2015-08-21: 2 mg via INTRAVENOUS
  Administered 2015-08-21 (×2): 1 mg via INTRAVENOUS

## 2015-08-21 MED ORDER — MIDAZOLAM HCL 5 MG/5ML IJ SOLN
INTRAMUSCULAR | Status: AC | PRN
  Administered 2015-08-21: 2 mg via INTRAVENOUS

## 2015-08-21 MED ORDER — MIDAZOLAM HCL 2 MG/2ML IJ SOLN
1.0000 mg | INTRAMUSCULAR | Status: DC | PRN
  Administered 2015-08-22 (×2): 1 mg via INTRAVENOUS
  Filled 2015-08-21 (×2): qty 2

## 2015-08-21 MED ORDER — PANTOPRAZOLE SODIUM 40 MG IV SOLR
40.0000 mg | Freq: Every day | INTRAVENOUS | Status: DC
  Administered 2015-08-22 – 2015-08-23 (×2): 40 mg via INTRAVENOUS
  Filled 2015-08-21 (×2): qty 40

## 2015-08-21 MED ORDER — FENTANYL BOLUS VIA INFUSION
25.0000 ug | INTRAVENOUS | Status: DC | PRN
  Filled 2015-08-21: qty 25

## 2015-08-21 MED ORDER — ASPIRIN 81 MG PO CHEW
81.0000 mg | CHEWABLE_TABLET | ORAL | Status: DC
  Administered 2015-08-22 – 2015-08-27 (×4): 81 mg
  Filled 2015-08-21 (×4): qty 1

## 2015-08-21 MED ORDER — SODIUM CHLORIDE 0.9 % IV SOLN
1.0000 g | Freq: Once | INTRAVENOUS | Status: DC
  Filled 2015-08-21: qty 10

## 2015-08-21 MED ORDER — ONDANSETRON HCL 4 MG/2ML IJ SOLN
INTRAMUSCULAR | Status: AC
  Filled 2015-08-21: qty 2

## 2015-08-21 MED ORDER — LIDOCAINE HCL (PF) 1 % IJ SOLN
INTRAMUSCULAR | Status: AC
  Filled 2015-08-21: qty 30

## 2015-08-21 MED ORDER — LIDOCAINE HCL (PF) 1 % IJ SOLN
INTRAMUSCULAR | Status: DC | PRN
  Administered 2015-08-21: 22:00:00

## 2015-08-21 MED ORDER — HEPARIN (PORCINE) IN NACL 2-0.9 UNIT/ML-% IJ SOLN
INTRAMUSCULAR | Status: AC
  Filled 2015-08-21: qty 500

## 2015-08-21 SURGICAL SUPPLY — 7 items
CATH S G BIP PACING (SET/KITS/TRAYS/PACK) ×3 IMPLANT
KIT HEART LEFT (KITS) ×3 IMPLANT
PACK CARDIAC CATHETERIZATION (CUSTOM PROCEDURE TRAY) ×3 IMPLANT
SHEATH PINNACLE 6F 10CM (SHEATH) ×3 IMPLANT
SLEEVE REPOSITIONING LENGTH 30 (MISCELLANEOUS) ×3 IMPLANT
TRANSDUCER W/STOPCOCK (MISCELLANEOUS) ×3 IMPLANT
TUBING CIL FLEX 10 FLL-RA (TUBING) ×3 IMPLANT

## 2015-08-21 NOTE — Progress Notes (Signed)
Pt's belongings given to pt's POA. Ms. Joaquim Lai.

## 2015-08-21 NOTE — Progress Notes (Signed)
Medication Administration Note  Versed 2 mg removed from [facility] under patient's account. Administered to patient in cath lab by Baxter Hire, RN at 2125.   Governor Specking, PharmD Clinical Pharmacy Resident Pager: [phone]
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
## 2015-08-21 NOTE — ED Notes (Signed)
Dr. Burt Knack states to take pt up to cath lab to place a temp wire prior to CT scanning.
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
## 2015-08-21 NOTE — ED Notes (Signed)
Pt had episode of asystole and loss of consciousness lasting about 30 seconds. Pts HR then decreased to the 30s and Dr. Kathi Simpers aware and ordered to give atropine.

## 2015-08-21 NOTE — ED Notes (Signed)
Pt placed on Zoll at [employer]
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
## 2015-08-21 NOTE — ED Notes (Signed)
Second episode of asystole and loss and consciousness lasting about 30 seconds at and then his HR went in the 30s. Episode happened around 2010.

## 2015-08-21 NOTE — ED Notes (Signed)
Pt transpor\ted to cath lab at 2114.

## 2015-08-21 NOTE — ED Notes (Signed)
Pt had episode of vtach, HR 150, +pulse and pt was conscious. MD Mesner and Gunalda at bedside throughout episode. After about one minute pts HR went back down to 90 and into afib on monitor. 300mg  Amiodarone drawn up and at bedside per order from [place].

## 2015-08-21 NOTE — ED Notes (Signed)
MD Kathi Simpers preparing for intubation.
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
## 2015-08-21 NOTE — ED Notes (Signed)
Pt being paced at 80 mA and rate of 60 bpm
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
## 2015-08-22 ENCOUNTER — Inpatient Hospital Stay (HOSPITAL_COMMUNITY): Payer: Medicare Other

## 2015-08-22 ENCOUNTER — Encounter (HOSPITAL_COMMUNITY): Payer: Self-pay

## 2015-08-22 DIAGNOSIS — I442 Atrioventricular block, complete: Principal | ICD-10-CM

## 2015-08-22 DIAGNOSIS — Z452 Encounter for adjustment and management of vascular access device: Secondary | ICD-10-CM | POA: Insufficient documentation

## 2015-08-22 DIAGNOSIS — R55 Syncope and collapse: Secondary | ICD-10-CM

## 2015-08-22 DIAGNOSIS — R001 Bradycardia, unspecified: Secondary | ICD-10-CM

## 2015-08-22 DIAGNOSIS — I459 Conduction disorder, unspecified: Secondary | ICD-10-CM

## 2015-08-22 LAB — BASIC METABOLIC PANEL
Anion gap: 6 (ref 5–15)
BUN: 24 mg/dL — AB (ref 6–20)
CALCIUM: 7.7 mg/dL — AB (ref 8.9–10.3)
CO2: 27 mmol/L (ref 22–32)
CREATININE: 1.28 mg/dL — AB (ref 0.61–1.24)
Chloride: 104 mmol/L (ref 101–111)
GFR calc Af Amer: 58 mL/min — ABNORMAL LOW (ref >60)
GFR, EST NON AFRICAN AMERICAN: 50 mL/min — AB (ref >60)
GLUCOSE: 164 mg/dL — AB (ref 65–99)
POTASSIUM: 3.8 mmol/L (ref 3.5–5.1)
Sodium: 137 mmol/L (ref 135–145)

## 2015-08-22 LAB — BLOOD GAS, ARTERIAL
ACID-BASE EXCESS: 1.9 mmol/L (ref 0.0–2.0)
Bicarbonate: 25.3 mEq/L — ABNORMAL HIGH (ref 20.0–24.0)
DRAWN BY: 44166
FIO2: 0.4
MECHVT: 550 mL
O2 Saturation: 99.2 %
PEEP/CPAP: 5 cmH2O
PO2 ART: 160 mmHg — AB (ref 80.0–100.0)
Patient temperature: 98.6
RATE: 20 resp/min
TCO2: 26.4 mmol/L (ref 0–100)
pCO2 arterial: 35.3 mmHg (ref 35.0–45.0)
pH, Arterial: 7.47 — ABNORMAL HIGH (ref 7.350–7.450)

## 2015-08-22 LAB — GLUCOSE, CAPILLARY
GLUCOSE-CAPILLARY: 127 mg/dL — AB (ref 65–99)
GLUCOSE-CAPILLARY: 145 mg/dL — AB (ref 65–99)
GLUCOSE-CAPILLARY: 156 mg/dL — AB (ref 65–99)
GLUCOSE-CAPILLARY: 158 mg/dL — AB (ref 65–99)
GLUCOSE-CAPILLARY: 161 mg/dL — AB (ref 65–99)
Glucose-Capillary: 133 mg/dL — ABNORMAL HIGH (ref 65–99)

## 2015-08-22 LAB — CBC
HEMATOCRIT: 33.9 % — AB (ref 39.0–52.0)
HEMOGLOBIN: 11.3 g/dL — AB (ref 13.0–17.0)
MCH: 31.1 pg (ref 26.0–34.0)
MCHC: 33.3 g/dL (ref 30.0–36.0)
MCV: 93.4 fL (ref 78.0–100.0)
PLATELETS: 155 10*3/uL (ref 150–400)
RBC: 3.63 MIL/uL — AB (ref 4.22–5.81)
RDW: 14.1 % (ref 11.5–15.5)
WBC: 14.1 10*3/uL — AB (ref 4.0–10.5)

## 2015-08-22 LAB — RAPID URINE DRUG SCREEN, HOSP PERFORMED
AMPHETAMINES: NOT DETECTED
Barbiturates: NOT DETECTED
Benzodiazepines: POSITIVE — AB
COCAINE: NOT DETECTED
OPIATES: NOT DETECTED
TETRAHYDROCANNABINOL: NOT DETECTED

## 2015-08-22 LAB — PHOSPHORUS: Phosphorus: 2.2 mg/dL — ABNORMAL LOW (ref 2.5–4.6)

## 2015-08-22 LAB — TROPONIN I
TROPONIN I: 0.74 ng/mL — AB (ref <0.031)
Troponin I: 1.08 ng/mL (ref <0.031)
Troponin I: 1.1 ng/mL (ref <0.031)

## 2015-08-22 LAB — LACTIC ACID, PLASMA
LACTIC ACID, VENOUS: 1.6 mmol/L (ref 0.5–2.0)
Lactic Acid, Venous: 1.4 mmol/L (ref 0.5–2.0)

## 2015-08-22 LAB — MRSA PCR SCREENING: MRSA BY PCR: NEGATIVE

## 2015-08-22 LAB — MAGNESIUM: MAGNESIUM: 1.7 mg/dL (ref 1.7–2.4)

## 2015-08-22 LAB — ETHANOL

## 2015-08-22 LAB — TSH: TSH: 1.151 u[IU]/mL (ref 0.350–4.500)

## 2015-08-22 LAB — CDS SEROLOGY

## 2015-08-22 MED ORDER — MIDAZOLAM HCL 2 MG/2ML IJ SOLN
INTRAMUSCULAR | Status: AC
  Filled 2015-08-22: qty 4

## 2015-08-22 MED ORDER — ONDANSETRON HCL 4 MG/2ML IJ SOLN
INTRAMUSCULAR | Status: AC
Start: 2015-08-22 — End: 2015-08-22
  Administered 2015-08-22: 4 mg via INTRAVENOUS
  Filled 2015-08-22: qty 2

## 2015-08-22 MED ORDER — FENTANYL CITRATE (PF) 100 MCG/2ML IJ SOLN
INTRAMUSCULAR | Status: AC
  Filled 2015-08-22: qty 2

## 2015-08-22 MED ORDER — CEFAZOLIN SODIUM-DEXTROSE 2-3 GM-% IV SOLR: 2.0000 g | INTRAVENOUS | Status: DC

## 2015-08-22 MED ORDER — ONDANSETRON HCL 4 MG/2ML IJ SOLN
4.0000 mg | Freq: Four times a day (QID) | INTRAMUSCULAR | Status: DC | PRN
  Administered 2015-08-22: 4 mg via INTRAVENOUS

## 2015-08-22 MED ORDER — MAGNESIUM SULFATE 2 GM/50ML IV SOLN
2.0000 g | Freq: Once | INTRAVENOUS | Status: AC
  Administered 2015-08-22: 2 g via INTRAVENOUS
  Filled 2015-08-22: qty 50

## 2015-08-22 MED ORDER — CHLORHEXIDINE GLUCONATE 4 % EX LIQD
60.0000 mL | Freq: Once | CUTANEOUS | Status: AC
Start: 2015-08-22 — End: 2015-08-22
  Administered 2015-08-22: 4 via TOPICAL
  Filled 2015-08-22: qty 60

## 2015-08-22 MED ORDER — IOHEXOL 300 MG/ML  SOLN
80.0000 mL | Freq: Once | INTRAMUSCULAR | Status: AC | PRN
  Administered 2015-08-22: 80 mL via INTRAVENOUS

## 2015-08-22 MED ORDER — MIDAZOLAM HCL 2 MG/2ML IJ SOLN
INTRAMUSCULAR | Status: AC
  Filled 2015-08-22: qty 2

## 2015-08-22 MED ORDER — SODIUM CHLORIDE 0.9 % IR SOLN: 80.0000 mg | Status: DC

## 2015-08-22 MED ORDER — MIDAZOLAM HCL 2 MG/2ML IJ SOLN
4.0000 mg | Freq: Once | INTRAMUSCULAR | Status: AC
  Administered 2015-08-22: 4 mg via INTRAVENOUS

## 2015-08-22 MED ORDER — POTASSIUM PHOSPHATES 15 MMOLE/5ML IV SOLN
30.0000 mmol | Freq: Once | INTRAVENOUS | Status: AC
  Administered 2015-08-22: 30 mmol via INTRAVENOUS
  Filled 2015-08-22: qty 10

## 2015-08-22 MED ORDER — CHLORHEXIDINE GLUCONATE 4 % EX LIQD
60.0000 mL | Freq: Once | CUTANEOUS | Status: AC
  Administered 2015-08-23: 4 via TOPICAL
  Filled 2015-08-22: qty 60

## 2015-08-22 NOTE — Care Management Note (Addendum)
Case Management Note  Patient Details  Name: [name] [name] MRN: [id number] Date of Birth: Jan 26, 1930  Subjective/Objective:       admw cardac arrest             Action/Plan: lives w friend   Expected Discharge Date:                  Expected Discharge Plan:     In-House Referral:     Discharge planning Services     Post Acute Care Choice:    Choice offered to:     DME Arranged:    DME Agency:     HH Arranged:    [place] Agency:     Status of Service:     Medicare Important Message Given:    Date Medicare IM Given:    Medicare IM give by:    Date Additional Medicare IM Given:    Additional Medicare Important Message give by:     If discussed at [place] of Stay Meetings, dates discussed:    Additional Comments: ur review done  Lacretia Leigh, RN 08/22/2015, 7:37 AM

## 2015-08-22 NOTE — Procedures (Signed)
Central Venous Catheter Insertion Procedure Note [name] [name] [id number] 02-26-1930  Procedure: Insertion of Central Venous Catheter Indications: Assessment of intravascular volume, Drug and/or fluid administration and Frequent blood sampling  Procedure Details Consent: Risks of procedure as well as the alternatives and risks of each were explained to the (patient/caregiver).  Consent for procedure obtained. Time Out: Verified patient identification, verified procedure, site/side was marked, verified correct patient position, special equipment/implants available, medications/allergies/relevent history reviewed, required imaging and test results available.  Performed  Maximum sterile technique was used including antiseptics, cap, gloves, gown, hand hygiene, mask and sheet. Skin prep: Chlorhexidine; local anesthetic administered A antimicrobial bonded/coated triple lumen catheter was placed in the right internal jugular vein using the Seldinger technique.  Evaluation Blood flow good Complications: No apparent complications Patient did tolerate procedure well. Chest X-ray ordered to verify placement.  CXR: pending.  Procedure performed under direct ultrasound guidance for real time vessel cannulation.      Montey Hora, [place] [facility] [place] [facility] Pager: [phone]  or [phone] 08/22/2015, 12:22 AM

## 2015-08-22 NOTE — Progress Notes (Signed)
abg collected  

## 2015-08-22 NOTE — Progress Notes (Signed)
Pt back on FS at this time tolerating it well.  

## 2015-08-22 NOTE — Progress Notes (Signed)
Pt back from CT. Uneventful transport. Pt transported on an FiO2 of 100%. RN at bedside.

## 2015-08-22 NOTE — Progress Notes (Signed)
*  PRELIMINARY RESULTS* Echocardiogram 2D Echocardiogram has been performed.  Leavy Cella 08/22/2015, 4:01 PM
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
## 2015-08-22 NOTE — Progress Notes (Signed)
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 imaged. The thyroid gland is unremarkable  in appearance. Mild hazy opacity at the right lung apex is thought to reflect atelectasis. Dense calcification is noted at the carotid bifurcations bilaterally, with likely at least moderate luminal narrowing. IMPRESSION: 1. No evidence of traumatic intracranial injury for fracture. 2. No evidence of fracture or dislocation with regard to the maxillofacial structures. 3. No evidence of fracture or subluxation along the cervical spine. 4. Soft tissue swelling about the right orbit and overlying the right frontal calvarium. Soft tissue swelling also noted at the nose. 5. Mild cortical volume loss and scattered small vessel ischemic microangiopathy. 6. Chronic lacunar infarct at the right basal ganglia. 7. Mild mucoperiosteal thickening at the maxillary sinuses bilaterally. Small mucus retention cysts at the bases of the frontal sinuses bilaterally. 8. Mild degenerative change noted along the cervical spine. 9. Mild hazy opacity at the right lung apex is thought to reflect atelectasis. 10. Dense calcification at the carotid bifurcations bilaterally, with likely at least moderate bilateral luminal narrowing. Carotid ultrasound would be helpful for further  evaluation, when and as deemed clinically appropriate. Electronically Signed   By: Garald Balding M.D.   On: 08/22/2015 02:50    Cardiac studies:   ECG:   Medications:   Scheduled Medications: . antiseptic oral rinse  7 mL Mouth Rinse QID  . aspirin  81 mg Per Tube QODAY  . brimonidine  1 drop Both Eyes BID  . calcium gluconate  1 g Intravenous Once  . chlorhexidine gluconate  15 mL Mouth Rinse BID  . pantoprazole (PROTONIX) IV  40 mg Intravenous QHS     Infusions: . sodium chloride 100 mL/hr at 08/21/15 2254  . DOPamine 18 mcg/kg/min (08/22/15 0400)  . fentaNYL infusion INTRAVENOUS 50 mcg/hr (08/21/15 2049)     PRN Medications:  sodium chloride, bisacodyl, fentaNYL, midazolam   Assessment and Plan:   Bradycardia S/p transvenous pacemaker. -will consult EP for PPM    Jones Bales, MD PGY-3, Internal Medicine Teaching Service 08/22/2015, 6:06 AM    I have examined the patient and reviewed assessment and plan and discussed with patient.  Agree with above as stated.  He will likely need PPM.  Consulted EP.  I personally tested his pacer.  Capture was lost at 1.5 mA.  I returned his output to the prior setting of 59mA.  He could tolerate a lower output.  Rate 60.  He is pacing only intermittently.  Not on any rate slowing meds at home.  Known conduction disease noted in the past.   Shevelle Smither S.
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
## 2015-08-23 ENCOUNTER — Inpatient Hospital Stay (HOSPITAL_COMMUNITY): Payer: Medicare Other

## 2015-08-23 ENCOUNTER — Encounter (HOSPITAL_COMMUNITY)
Admission: EM | Disposition: A | Discharge status: Home Health | Payer: Medicare Other | Source: Home / Self Care | Attending: Cardiovascular Disease

## 2015-08-23 DIAGNOSIS — I442 Atrioventricular block, complete: Secondary | ICD-10-CM

## 2015-08-23 HISTORY — PX: EP IMPLANTABLE DEVICE: SHX172B

## 2015-08-23 HISTORY — DX: Atrioventricular block, complete: I44.2

## 2015-08-23 LAB — URINALYSIS, ROUTINE W REFLEX MICROSCOPIC
BILIRUBIN URINE: NEGATIVE
Glucose, UA: NEGATIVE mg/dL
KETONES UR: 15 mg/dL — AB
Leukocytes, UA: NEGATIVE
NITRITE: NEGATIVE
PH: 5 (ref 5.0–8.0)
Protein, ur: 30 mg/dL — AB
SPECIFIC GRAVITY, URINE: 1.029 (ref 1.005–1.030)

## 2015-08-23 LAB — BLOOD GAS, ARTERIAL
ACID-BASE DEFICIT: 0.4 mmol/L (ref 0.0–2.0)
Bicarbonate: 22.1 mEq/L (ref 20.0–24.0)
Drawn by: 441661
FIO2: 0.4
LHR: 20 {breaths}/min
O2 SAT: 99.6 %
PEEP/CPAP: 5 cmH2O
PH ART: 7.511 — AB (ref 7.350–7.450)
Patient temperature: 100.4
TCO2: 22.9 mmol/L (ref 0–100)
VT: 550 mL
pCO2 arterial: 28.1 mmHg — ABNORMAL LOW (ref 35.0–45.0)
pO2, Arterial: 173 mmHg — ABNORMAL HIGH (ref 80.0–100.0)

## 2015-08-23 LAB — BASIC METABOLIC PANEL
ANION GAP: 5 (ref 5–15)
BUN: 20 mg/dL (ref 6–20)
CALCIUM: 7.6 mg/dL — AB (ref 8.9–10.3)
CHLORIDE: 108 mmol/L (ref 101–111)
CO2: 23 mmol/L (ref 22–32)
Creatinine, Ser: 1.17 mg/dL (ref 0.61–1.24)
GFR calc non Af Amer: 56 mL/min — ABNORMAL LOW (ref >60)
GLUCOSE: 129 mg/dL — AB (ref 65–99)
Potassium: 3.7 mmol/L (ref 3.5–5.1)
Sodium: 136 mmol/L (ref 135–145)

## 2015-08-23 LAB — GLUCOSE, CAPILLARY
GLUCOSE-CAPILLARY: 130 mg/dL — AB (ref 65–99)
GLUCOSE-CAPILLARY: 119 mg/dL — AB (ref 65–99)
GLUCOSE-CAPILLARY: 118 mg/dL — AB (ref 65–99)
GLUCOSE-CAPILLARY: 126 mg/dL — AB (ref 65–99)
Glucose-Capillary: 134 mg/dL — ABNORMAL HIGH (ref 65–99)
Glucose-Capillary: 113 mg/dL — ABNORMAL HIGH (ref 65–99)

## 2015-08-23 LAB — PHOSPHORUS: PHOSPHORUS: 2.2 mg/dL — AB (ref 2.5–4.6)

## 2015-08-23 LAB — CBC
HEMATOCRIT: 27.4 % — AB (ref 39.0–52.0)
HEMOGLOBIN: 9.5 g/dL — AB (ref 13.0–17.0)
MCH: 31.7 pg (ref 26.0–34.0)
MCHC: 34.7 g/dL (ref 30.0–36.0)
MCV: 91.3 fL (ref 78.0–100.0)
Platelets: 103 10*3/uL — ABNORMAL LOW (ref 150–400)
RBC: 3 MIL/uL — ABNORMAL LOW (ref 4.22–5.81)
RDW: 14.5 % (ref 11.5–15.5)
WBC: 7.8 10*3/uL (ref 4.0–10.5)

## 2015-08-23 LAB — URINE MICROSCOPIC-ADD ON

## 2015-08-23 LAB — MAGNESIUM: Magnesium: 2.4 mg/dL (ref 1.7–2.4)

## 2015-08-23 SURGERY — PACEMAKER IMPLANT
Anesthesia: LOCAL

## 2015-08-23 MED ORDER — PIPERACILLIN-TAZOBACTAM 3.375 G IVPB
3.3750 g | Freq: Three times a day (TID) | INTRAVENOUS | Status: DC
  Administered 2015-08-23 – 2015-08-24 (×2): 3.375 g via INTRAVENOUS
  Filled 2015-08-23 (×4): qty 50

## 2015-08-23 MED ORDER — ACETAMINOPHEN 650 MG RE SUPP
325.0000 mg | RECTAL | Status: DC | PRN
  Administered 2015-08-23: 650 mg via RECTAL
  Filled 2015-08-23: qty 1

## 2015-08-23 MED ORDER — POTASSIUM PHOSPHATES 15 MMOLE/5ML IV SOLN
30.0000 mmol | Freq: Once | INTRAVENOUS | Status: AC
  Administered 2015-08-23: 30 mmol via INTRAVENOUS
  Filled 2015-08-23: qty 10

## 2015-08-23 MED ORDER — FENTANYL CITRATE (PF) 100 MCG/2ML IJ SOLN
INTRAMUSCULAR | Status: AC
  Filled 2015-08-23: qty 2

## 2015-08-23 MED ORDER — SODIUM CHLORIDE 0.9 % IJ SOLN
10.0000 mL | Freq: Two times a day (BID) | INTRAMUSCULAR | Status: DC
  Administered 2015-08-23: 10 mL
  Administered 2015-08-23: 20 mL
  Administered 2015-08-26: 10 mL

## 2015-08-23 MED ORDER — CEFAZOLIN SODIUM 1-5 GM-% IV SOLN
1.0000 g | Freq: Four times a day (QID) | INTRAVENOUS | Status: DC
  Filled 2015-08-23 (×2): qty 50

## 2015-08-23 MED ORDER — CEFAZOLIN SODIUM-DEXTROSE 2-3 GM-% IV SOLR
INTRAVENOUS | Status: DC | PRN
  Administered 2015-08-23: 2 g via INTRAVENOUS

## 2015-08-23 MED ORDER — ACETAMINOPHEN 650 MG RE SUPP
650.0000 mg | Freq: Once | RECTAL | Status: AC
  Administered 2015-08-23: 650 mg via RECTAL
  Filled 2015-08-23: qty 1

## 2015-08-23 MED ORDER — SODIUM CHLORIDE 0.9 % IR SOLN
Status: AC
  Filled 2015-08-23: qty 2

## 2015-08-23 MED ORDER — HEPARIN (PORCINE) IN NACL 2-0.9 UNIT/ML-% IJ SOLN
INTRAMUSCULAR | Status: AC
  Filled 2015-08-23: qty 500

## 2015-08-23 MED ORDER — LIDOCAINE HCL (PF) 1 % IJ SOLN
INTRAMUSCULAR | Status: AC
  Filled 2015-08-23: qty 60

## 2015-08-23 MED ORDER — MIDAZOLAM HCL 5 MG/5ML IJ SOLN
INTRAMUSCULAR | Status: AC
  Filled 2015-08-23: qty 5

## 2015-08-23 MED ORDER — VANCOMYCIN HCL IN DEXTROSE 1-5 GM/200ML-% IV SOLN
1000.0000 mg | INTRAVENOUS | Status: AC
  Administered 2015-08-23: 1000 mg via INTRAVENOUS
  Filled 2015-08-23: qty 200

## 2015-08-23 MED ORDER — ONDANSETRON HCL 4 MG/2ML IJ SOLN: 4.0000 mg | Freq: Four times a day (QID) | INTRAMUSCULAR | Status: DC | PRN

## 2015-08-23 MED ORDER — SODIUM CHLORIDE 0.9 % IJ SOLN
10.0000 mL | INTRAMUSCULAR | Status: DC | PRN
  Administered 2015-08-26: 10 mL
  Filled 2015-08-23: qty 40

## 2015-08-23 MED ORDER — FENTANYL CITRATE (PF) 100 MCG/2ML IJ SOLN
INTRAMUSCULAR | Status: DC | PRN
  Administered 2015-08-23: 25 ug via INTRAVENOUS

## 2015-08-23 MED ORDER — CEFAZOLIN SODIUM-DEXTROSE 2-3 GM-% IV SOLR
INTRAVENOUS | Status: AC
  Filled 2015-08-23: qty 50

## 2015-08-23 MED ORDER — ACETAMINOPHEN 325 MG PO TABS
325.0000 mg | ORAL_TABLET | ORAL | Status: DC | PRN
  Administered 2015-08-24 – 2015-08-26 (×3): 650 mg via ORAL
  Filled 2015-08-23 (×3): qty 2

## 2015-08-23 MED ORDER — VANCOMYCIN HCL IN DEXTROSE 750-5 MG/150ML-% IV SOLN
750.0000 mg | Freq: Two times a day (BID) | INTRAVENOUS | Status: DC
  Filled 2015-08-23 (×2): qty 150

## 2015-08-23 SURGICAL SUPPLY — 7 items
CABLE SURGICAL S-101-97-12 (CABLE) ×2 IMPLANT
LEAD CAPSURE NOVUS 45CM (Lead) ×2 IMPLANT
LEAD CAPSURE NOVUS 5076-52CM (Lead) ×2 IMPLANT
PAD DEFIB LIFELINK (PAD) ×2 IMPLANT
PPM ADVISA MRI DR A2DR01 (Pacemaker) ×2 IMPLANT
SHEATH CLASSIC 7F (SHEATH) ×4 IMPLANT
TRAY PACEMAKER INSERTION (PACKS) ×2 IMPLANT

## 2015-08-23 NOTE — Progress Notes (Signed)
Although the patient does have some neck discomfort, when I took his collar off he had minimal tenderness and he is completely neurologically intact.  I have taken his collar off.  Flexion-extension films are negative.  His CT was negative for an acute fracture.  His C-spine is cleared.  Kathryne Eriksson. Dahlia Bailiff, MD, [place] [phone] Trauma Surgeon

## 2015-08-23 NOTE — Progress Notes (Addendum)
Attempted to clear neck, removed collar and stabilized the neck still, tender to palpation over the cervical spine and when we attempted minimal lateral rotation the patient was in severe pain.  Will ask the trauma service to come in and clear patient.  I do not feel comfortable clearing the cervical spine.  Spoke with Dr. Hulen Skains from trauma, asked for a flexion extension x-ray and he will follow.  Rush Farmer, M.D. [facility] Pulmonary/Critical Care Medicine. Pager: [phone]. After hours pager: [phone].

## 2015-08-23 NOTE — Progress Notes (Signed)
eLink Physician-Brief Progress Note Patient Name: [name] [name] DOB: Aug 08, 1930 MRN: [id number]   Date of Service  08/23/2015  HPI/Events of Note  Fever to 101.3 F. Dr. Pura Spice note from this AM states that there is no evidence for infection. Last CXR reveals what is termed a resolving RLL opacity. Not entirely sure that his fever reflects infection vs atelectasis. However, will err on the side of caution.   eICU Interventions  Will order: 1. Blood Cultures X 2.  2. UA with reflex microscopic. 3. Sputum Culture. 4. Empiric coverage with Vancomycin and Zosyn per Pharmacy.  5. Incentive spirometry Q 1 hour while awake.      Intervention Category Intermediate Interventions: Other:  [name] Cornelia Copa 08/23/2015, 8:30 PM

## 2015-08-23 NOTE — Care Management Important Message (Signed)
Important Message  Patient Details  Name: Favion Rosencrance MRN: [id number] Date of Birth: 11/07/1929   Medicare Important Message Given:  Yes    Sherran Margolis P Stayton 08/23/2015, 2:12 PM

## 2015-08-23 NOTE — Progress Notes (Signed)
ANTIBIOTIC CONSULT NOTE - INITIAL  Pharmacy Consult for Vancomycin/Zosyn Indication: pneumonia  No Known Allergies  Patient Measurements: Height: 5\' 8"  (172.7 cm) Weight: 157 lb 10.1 oz (71.5 kg) IBW/kg (Calculated) : 68.4 Adjusted Body Weight:    Vital Signs: Temp: 101.3 F (38.5 C) (12/16 2000) Temp Source: Core (Comment) (12/16 2000) BP: 117/51 mmHg (12/16 2000) Pulse Rate: 62 (12/16 2000) Intake/Output from previous day: 12/15 0701 - 12/16 0700 In: 2284.6 [I.V.:1724.6; IV Piggyback:560] Out: 1015 [Urine:1015] Intake/Output from this shift:    Labs:  Recent Labs  08/21/15 1940 08/21/15 2044 08/22/15 0245 08/23/15 0410  WBC 8.7  --  14.1* 7.8  HGB 13.5 14.3 11.3* 9.5*  PLT 178  --  155 103*  CREATININE 1.54* 1.60* 1.28* 1.17   Estimated Creatinine Clearance: 46.3 mL/min (by C-G formula based on Cr of 1.17). No results for input([name]): VANCOTROUGH, VANCOPEAK, VANCORANDOM, GENTTROUGH, GENTPEAK, GENTRANDOM, TOBRATROUGH, TOBRAPEAK, TOBRARND, AMIKACINPEAK, AMIKACINTROU, AMIKACIN in the last 72 hours.   Microbiology:   Medical History: Past Medical History  Diagnosis Date  . Aortic stenosis     [name]/p AVR in 2004  . Spasmodic torticollis     recent with persistant headaches  . Sinus bradycardia   . Hypercholesterolemia     On Simvastatin  . Coronary artery disease     [name]/p CABG in 2004  . Abnormal Holter monitor finding November 2012    Runs of SVT with profound bradycardia  . AAA (abdominal aortic aneurysm) ([place])   . Bifascicular block   . Right bundle branch block   . Arthritis    Assessment:  Infectious Disease: WBC 14.1>7.8, Tmax 101.1. No current abx. Pre-cath cefazolin. Temp up to 101.3 this PM with resolving RLL opacity on CXR.  Adding Vanco/Zosyn. 12/15 BC still pending. MRSA PCR negative  Vanco 12/16>> Zosyn 12/16>>  Goal of Therapy:  Vancomycin trough level 15-20 mcg/ml  Plan:  Zosyn 3.375g IV q8hr Vanco 1g IV x 1 then 750mg  IV q12h. Trough  after 3-5 doses at steady state. Will d/c post ICD Ancef   [name] [name]. [name] [name], PharmD, BCPS Clinical Staff Pharmacist Pager [phone]  Eilene Ghazi Stillinger 08/23/2015,8:40 PM

## 2015-08-23 NOTE — Procedures (Signed)
Extubation Procedure Note  Patient Details:   Name: Corinthian Fayne DOB: 1929/11/03 MRN: [id number]   Airway Documentation:     Evaluation  O2 sats: stable throughout Complications: No apparent complications Patient did tolerate procedure well. Bilateral Breath Sounds: Clear, Diminished Suctioning: Airway Yes  Patient tolerated wean. MD ordered to extubate. Positive for cuff leak. Patient extubated to a 6 Lpm nasal cannula. No signs of stridor or dyspnea. Patient instructed on the Incentive Spirometer, achieving 600 mL five times. RN at bedside. Patient resting comfortably.   Myrtie Neither 08/23/2015, 11:17 AM

## 2015-08-23 NOTE — Progress Notes (Signed)
Mechanical Ventilation Settings changed per DETERDING,ELIZABETH MD.
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
## 2015-08-23 NOTE — Progress Notes (Signed)
eLink Physician-Brief Progress Note Patient Name: [name] [name] DOB: 11-24-1929 MRN: [id number]   Date of Service  08/23/2015  HPI/Events of Note  Resp alkalosis with pH 7.5 and pCO2 of 28  eICU Interventions  Plan: Decrease TV to 500cc Decrease RR to 18     Intervention Category Major Interventions: Acid-Base disturbance - evaluation and management  DETERDING,ELIZABETH 08/23/2015, 4:38 AM

## 2015-08-23 NOTE — Progress Notes (Signed)
abg collected  
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
## 2015-08-23 NOTE — Clinical Documentation Improvement (Signed)
Critical Care Please update your documentation within the medical record to reflect your response to this query.  Thank you  Can the diagnosis of Shock be further specified?   Shock, including Type:  Septic, Cardiogenic, Hyper/Hypoglycemic, Hypovolemic, Hemorrhagic, Neurogenic, Anaphylactic, Other type, including suspected or known cause and/or associated condition(s)  Other  Clinically Undetermined  Document any associated diagnoses/conditions.  Supporting Information: 08/21/15: H&P..Marland Kitchen"Bradycardia followed by cardiac arrest x 3 - now on TC pacing.-Shock - due to above.Marland KitchenMarland Kitchen"Cardiology consulted by EDP - taking pt to cath lab now. Will have temp pacing wire placed and will probably require PPM at some point. Continue dopamine for goal MAP > 65. Trend troponins / lactate.".Marland KitchenMarland Kitchen  Please exercise your independent, professional judgment when responding. A specific answer is not anticipated or expected.  Thank You,  Ermelinda Das, RN, BSN, [place] Certified Clinical Documentation Specialist [place]: Health Information Management [phone]

## 2015-08-24 ENCOUNTER — Encounter (HOSPITAL_COMMUNITY): Payer: Self-pay

## 2015-08-24 ENCOUNTER — Inpatient Hospital Stay (HOSPITAL_COMMUNITY): Payer: Medicare Other

## 2015-08-24 LAB — EXPECTORATED SPUTUM ASSESSMENT W REFEX TO RESP CULTURE: SPECIAL REQUESTS: NORMAL

## 2015-08-24 LAB — BASIC METABOLIC PANEL
Anion gap: 7 (ref 5–15)
BUN: 18 mg/dL (ref 6–20)
CO2: 23 mmol/L (ref 22–32)
CREATININE: 1.01 mg/dL (ref 0.61–1.24)
Calcium: 7.7 mg/dL — ABNORMAL LOW (ref 8.9–10.3)
Chloride: 109 mmol/L (ref 101–111)
GFR calc Af Amer: >60 mL/min (ref >60)
GLUCOSE: 124 mg/dL — AB (ref 65–99)
POTASSIUM: 4.2 mmol/L (ref 3.5–5.1)
SODIUM: 139 mmol/L (ref 135–145)

## 2015-08-24 LAB — PHOSPHORUS: Phosphorus: 2.9 mg/dL (ref 2.5–4.6)

## 2015-08-24 LAB — GLUCOSE, CAPILLARY
GLUCOSE-CAPILLARY: 133 mg/dL — AB (ref 65–99)
Glucose-Capillary: 130 mg/dL — ABNORMAL HIGH (ref 65–99)
Glucose-Capillary: 110 mg/dL — ABNORMAL HIGH (ref 65–99)
Glucose-Capillary: 147 mg/dL — ABNORMAL HIGH (ref 65–99)

## 2015-08-24 LAB — TROPONIN I
TROPONIN I: 0.09 ng/mL — AB (ref <0.031)
TROPONIN I: 0.08 ng/mL — AB (ref <0.031)
Troponin I: 0.12 ng/mL — ABNORMAL HIGH (ref <0.031)

## 2015-08-24 LAB — EXPECTORATED SPUTUM ASSESSMENT W GRAM STAIN, RFLX TO RESP C

## 2015-08-24 LAB — CBC
HCT: 28.1 % — ABNORMAL LOW (ref 39.0–52.0)
Hemoglobin: 9.5 g/dL — ABNORMAL LOW (ref 13.0–17.0)
MCH: 31.7 pg (ref 26.0–34.0)
MCHC: 33.8 g/dL (ref 30.0–36.0)
MCV: 93.7 fL (ref 78.0–100.0)
PLATELETS: 101 10*3/uL — AB (ref 150–400)
RBC: 3 MIL/uL — AB (ref 4.22–5.81)
RDW: 14.5 % (ref 11.5–15.5)
WBC: 9.2 10*3/uL (ref 4.0–10.5)

## 2015-08-24 LAB — MAGNESIUM: MAGNESIUM: 2.2 mg/dL (ref 1.7–2.4)

## 2015-08-24 MED ORDER — PANTOPRAZOLE SODIUM 40 MG PO PACK
40.0000 mg | PACK | Freq: Every day | ORAL | Status: DC
  Administered 2015-08-24 – 2015-08-26 (×3): 40 mg via ORAL
  Filled 2015-08-24 (×3): qty 20

## 2015-08-24 MED ORDER — GUAIFENESIN ER 600 MG PO TB12
600.0000 mg | ORAL_TABLET | Freq: Once | ORAL | Status: AC
  Administered 2015-08-24: 600 mg via ORAL
  Filled 2015-08-24: qty 1

## 2015-08-24 MED ORDER — LEVOFLOXACIN 500 MG PO TABS
500.0000 mg | ORAL_TABLET | Freq: Every day | ORAL | Status: DC
  Filled 2015-08-24: qty 1

## 2015-08-24 MED ORDER — CHLORHEXIDINE GLUCONATE 0.12 % MT SOLN
15.0000 mL | Freq: Two times a day (BID) | OROMUCOSAL | Status: DC
  Administered 2015-08-24 – 2015-08-27 (×6): 15 mL via OROMUCOSAL
  Filled 2015-08-24 (×6): qty 15
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
## 2015-08-25 DIAGNOSIS — R7989 Other specified abnormal findings of blood chemistry: Secondary | ICD-10-CM

## 2015-08-25 LAB — CBC WITH DIFFERENTIAL/PLATELET
Basophils Absolute: 0 10*3/uL (ref 0.0–0.1)
Basophils Relative: 0 %
Eosinophils Absolute: 0.1 10*3/uL (ref 0.0–0.7)
Eosinophils Relative: 2 %
HCT: 24.7 % — ABNORMAL LOW (ref 39.0–52.0)
HEMOGLOBIN: 8.5 g/dL — AB (ref 13.0–17.0)
LYMPHS ABS: 0.4 10*3/uL — AB (ref 0.7–4.0)
LYMPHS PCT: 7 %
MCH: 32 pg (ref 26.0–34.0)
MCHC: 34.4 g/dL (ref 30.0–36.0)
MCV: 92.9 fL (ref 78.0–100.0)
Monocytes Absolute: 0.3 10*3/uL (ref 0.1–1.0)
Monocytes Relative: 4 %
NEUTROS PCT: 86 %
Neutro Abs: 5.4 10*3/uL (ref 1.7–7.7)
Platelets: 105 10*3/uL — ABNORMAL LOW (ref 150–400)
RBC: 2.66 MIL/uL — AB (ref 4.22–5.81)
RDW: 14.5 % (ref 11.5–15.5)
WBC: 6.2 10*3/uL (ref 4.0–10.5)

## 2015-08-25 LAB — GLUCOSE, CAPILLARY
GLUCOSE-CAPILLARY: 119 mg/dL — AB (ref 65–99)
GLUCOSE-CAPILLARY: 127 mg/dL — AB (ref 65–99)
GLUCOSE-CAPILLARY: 186 mg/dL — AB (ref 65–99)
Glucose-Capillary: 166 mg/dL — ABNORMAL HIGH (ref 65–99)
Glucose-Capillary: 168 mg/dL — ABNORMAL HIGH (ref 65–99)
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
## 2015-08-25 NOTE — Progress Notes (Signed)
OT Cancellation Note  Patient Details Name: [name] [name] MRN: [id number] DOB: 29-Apr-1930   Cancelled Treatment:    Reason Eval/Treat Not Completed: Other (comment) (Pt eating lunch )  Darlina Rumpf [place], OTR/L [id number]  08/25/2015, 1:00 PM

## 2015-08-26 ENCOUNTER — Encounter (HOSPITAL_COMMUNITY): Payer: Self-pay | Admitting: Cardiology

## 2015-08-26 LAB — GLUCOSE, CAPILLARY: Glucose-Capillary: 110 mg/dL — ABNORMAL HIGH (ref 65–99)

## 2015-08-26 MED ORDER — BRIMONIDINE TARTRATE 0.2 % OP SOLN
1.0000 [drp] | Freq: Two times a day (BID) | OPHTHALMIC | Status: DC
  Administered 2015-08-26 – 2015-08-27 (×3): 1 [drp] via OPHTHALMIC
  Filled 2015-08-26: qty 5

## 2015-08-26 MED ORDER — METOPROLOL TARTRATE 25 MG PO TABS
25.0000 mg | ORAL_TABLET | Freq: Two times a day (BID) | ORAL | Status: DC
  Administered 2015-08-26 – 2015-08-27 (×3): 25 mg via ORAL
  Filled 2015-08-26 (×3): qty 1

## 2015-08-26 MED ORDER — METOPROLOL TARTRATE 25 MG PO TABS: 25.0000 mg | ORAL_TABLET | Freq: Two times a day (BID) | ORAL | Status: DC

## 2015-08-26 MED ORDER — FUROSEMIDE 10 MG/ML IJ SOLN
40.0000 mg | Freq: Once | INTRAMUSCULAR | Status: AC
  Administered 2015-08-26: 40 mg via INTRAVENOUS
  Filled 2015-08-26: qty 4

## 2015-08-26 MED FILL — Heparin Sodium (Porcine) 2 Unit/ML in Sodium Chloride 0.9%: INTRAMUSCULAR | Qty: 500 | Status: AC

## 2015-08-26 MED FILL — Gentamicin Sulfate Inj 40 MG/ML: INTRAMUSCULAR | Qty: 2 | Status: AC

## 2015-08-26 MED FILL — Lidocaine HCl Local Preservative Free (PF) Inj 1%: INTRAMUSCULAR | Qty: 30 | Status: AC

## 2015-08-26 MED FILL — Sodium Chloride Irrigation Soln 0.9%: Qty: 500 | Status: AC

## 2015-08-26 NOTE — Progress Notes (Signed)
SATURATION QUALIFICATIONS: (This note is used to comply with regulatory documentation for home oxygen Patient Saturations on Room Air at Rest = 93%  Patient Saturations on Room Air while Ambulating =87%  Patient Saturations on 2l Liters of oxygen while Ambulating = 99%  Please briefly explain why patient needs home oxygen:
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
## 2015-08-26 NOTE — Progress Notes (Signed)
Pt is not agreeable to SNF at this time- believes he will be safe at home with help of his friend [name].  CSW given permission to discuss with Oswaldo Milian believes they will be able to manage at home and will plan to be at pt house more often for the next week after DC from hospital.  CSW informed RNCM- PT to see pt again this morning  CSW signing off at this time.  Domenica Reamer, [place] Social Worker [phone]
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
## 2015-08-26 NOTE — Progress Notes (Signed)
The patient refused his 0000 CBG check.
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
## 2015-08-26 NOTE — Progress Notes (Addendum)
SATURATION QUALIFICATIONS: (This note is used to comply with regulatory documentation for home oxygen)  Patient Saturations on Room Air at Rest = 94%  Patient Saturations on Room Air while Ambulating = 87% at end of gait on Room air
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
## 2015-08-26 NOTE — Progress Notes (Signed)
The patient has declined despite multiple discussions with social Engineer, structural, as well as Dr. Curt Bears this morning and myself to go to a SNF/rehab as recommended.  His close friend [name], who the patient states helps him reported to case management that the patient can stay with him so he is not alone.  During this process, he was noted to desat to 87% on RA with ambulation and home O2 was recommended as well.  Discussed with Dr. Curt Bears, given this, we held his discharge to give IV lasix and re-evaluate him for probable discharge tomorrow morning. The patient is comfortable and agreeable to the plan.  Case manager and RN were notified.   Tommye Standard, PA-C  Agree with plan for one additional night with lasix for diuresis.  Isidor Bromell

## 2015-08-27 ENCOUNTER — Other Ambulatory Visit: Payer: Self-pay | Admitting: Physician Assistant

## 2015-08-27 DIAGNOSIS — I509 Heart failure, unspecified: Secondary | ICD-10-CM

## 2015-08-27 LAB — CULTURE, BLOOD (ROUTINE X 2)
Culture: NO GROWTH
Culture: NO GROWTH

## 2015-08-27 LAB — CULTURE, RESPIRATORY

## 2015-08-27 LAB — CULTURE, RESPIRATORY W GRAM STAIN

## 2015-08-27 LAB — GLUCOSE, CAPILLARY: Glucose-Capillary: 110 mg/dL — ABNORMAL HIGH (ref 65–99)

## 2015-08-27 MED ORDER — FUROSEMIDE 10 MG/ML IJ SOLN
80.0000 mg | Freq: Once | INTRAMUSCULAR | Status: AC
  Administered 2015-08-27: 80 mg via INTRAVENOUS
  Filled 2015-08-27: qty 8

## 2015-08-27 NOTE — Progress Notes (Signed)
Ambulated on RA sats maintained 90-94%. Walked 150 feet.   Benjamin Casanas, Mervin Kung  RN

## 2015-08-27 NOTE — Care Management Important Message (Signed)
Important Message  Patient Details  Name: [name] [name] MRN: [id number] Date of Birth: September 19, 1929   Medicare Important Message Given:  Yes    Nathen May 08/27/2015, 5:05 PM
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
## 2015-08-27 NOTE — Progress Notes (Signed)
Iv removed. No issues at present. Reviewed discharge instructions with patient and care giver. Escorted out via wheelchair.  Io Dieujuste, Mervin Kung RN

## 2015-08-27 NOTE — Care Management Important Message (Signed)
Important Message  Patient Details  Name: [name] [name] MRN: [id number] Date of Birth: [birth year]   Medicare Important Message Given:  Yes    Nathen May 08/27/2015, 5:06 PM
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
## 2015-08-27 NOTE — Progress Notes (Signed)
Patient refuses to have accu cks done

## 2015-08-27 NOTE — Discharge Instructions (Signed)
° ° °  Supplemental Discharge Instructions for  Pacemaker/Defibrillator Patients  Activity No heavy lifting or vigorous activity with your left arm for 6 to 8 weeks.  Do not raise your left arm above your head for one week.  Gradually raise your affected arm as drawn below.             08/28/15                   08/29/15                  08/30/15                08/31/15 __  NO DRIVING for until seen in follow and cleared to drive.  WOUND CARE - Keep the wound area clean and dry.  Do not get this area wet for one week. No showers for one week; you may shower on 08/31/15     . - The tape/steri-strips on your wound will fall off; do not pull them off.  No bandage is needed on the site.  DO  NOT apply any creams, oils, or ointments to the wound area. - If you notice any drainage or discharge from the wound, any swelling or bruising at the site, or you develop a fever > 101? F after you are discharged home, call the office at once.  Special Instructions - You are still able to use cellular telephones; use the ear opposite the side where you have your pacemaker/defibrillator.  Avoid carrying your cellular phone near your device. - When traveling through airports, show security personnel your identification card to avoid being screened in the metal detectors.  Ask the security personnel to use the hand wand. - Avoid arc welding equipment, MRI testing (magnetic resonance imaging), TENS units (transcutaneous nerve stimulators).  Call the office for questions about other devices. - Avoid electrical appliances that are in poor condition or are not properly grounded. - Microwave ovens are safe to be near or to operate.  Additional information for defibrillator patients should your device go off: - If your device goes off ONCE and you feel fine afterward, notify the device clinic nurses. - If your device goes off ONCE and you do not feel well afterward, call 911. - If your device goes off TWICE, call  911. - If your device goes off THREE times in one day, call 911.  DO NOT DRIVE YOURSELF OR A FAMILY MEMBER WITH A DEFIBRILLATOR TO THE HOSPITAL--CALL 911.   You are ordered for labs tomorrow at the [facility] heart care office, you can have these done any time between 7:30AM and 4:30PM

## 2015-08-28 ENCOUNTER — Other Ambulatory Visit (INDEPENDENT_AMBULATORY_CARE_PROVIDER_SITE_OTHER): Payer: Medicare Other | Admitting: *Deleted

## 2015-08-28 DIAGNOSIS — I259 Chronic ischemic heart disease, unspecified: Secondary | ICD-10-CM

## 2015-08-28 DIAGNOSIS — R001 Bradycardia, unspecified: Secondary | ICD-10-CM

## 2015-08-28 DIAGNOSIS — I119 Hypertensive heart disease without heart failure: Secondary | ICD-10-CM

## 2015-08-28 DIAGNOSIS — I10 Essential (primary) hypertension: Secondary | ICD-10-CM | POA: Diagnosis not present

## 2015-08-28 LAB — CULTURE, BLOOD (ROUTINE X 2)
Culture: NO GROWTH
Culture: NO GROWTH

## 2015-08-28 NOTE — Addendum Note (Signed)
Addended by: Eulis Foster on: 08/28/2015 01:57 PM   Modules accepted: Orders

## 2015-08-28 NOTE — Addendum Note (Signed)
Addended by: Eulis Foster on: 08/28/2015 01:55 PM   Modules accepted: Orders

## 2015-08-29 ENCOUNTER — Telehealth: Payer: Self-pay | Admitting: *Deleted

## 2015-08-29 ENCOUNTER — Other Ambulatory Visit: Payer: Self-pay | Admitting: *Deleted

## 2015-08-29 DIAGNOSIS — R001 Bradycardia, unspecified: Secondary | ICD-10-CM

## 2015-08-29 LAB — CBC WITH DIFFERENTIAL/PLATELET
BASOS ABS: 0 10*3/uL (ref 0.0–0.1)
BASOS PCT: 0 % (ref 0–1)
EOS ABS: 0.5 10*3/uL (ref 0.0–0.7)
Eosinophils Relative: 5 % (ref 0–5)
HCT: 24.8 % — ABNORMAL LOW (ref 39.0–52.0)
Hemoglobin: 8.6 g/dL — ABNORMAL LOW (ref 13.0–17.0)
Lymphocytes Relative: 13 % (ref 12–46)
Lymphs Abs: 1.2 10*3/uL (ref 0.7–4.0)
MCH: 32 pg (ref 26.0–34.0)
MCHC: 34.7 g/dL (ref 30.0–36.0)
MCV: 92.2 fL (ref 78.0–100.0)
MONOS PCT: 5 % (ref 3–12)
MPV: 10.1 fL (ref 8.6–12.4)
Monocytes Absolute: 0.5 10*3/uL (ref 0.1–1.0)
NEUTROS PCT: 77 % (ref 43–77)
Neutro Abs: 7.3 10*3/uL (ref 1.7–7.7)
PLATELETS: 191 10*3/uL (ref 150–400)
RBC: 2.69 MIL/uL — AB (ref 4.22–5.81)
RDW: 14.8 % (ref 11.5–15.5)
WBC: 9.5 10*3/uL (ref 4.0–10.5)

## 2015-08-29 LAB — BASIC METABOLIC PANEL
BUN: 20 mg/dL (ref 7–25)
CALCIUM: 8.2 mg/dL — AB (ref 8.6–10.3)
CO2: 24 mmol/L (ref 20–31)
Chloride: 100 mmol/L (ref 98–110)
Creat: 0.86 mg/dL (ref 0.70–1.11)
Glucose, Bld: 182 mg/dL — ABNORMAL HIGH (ref 65–99)
Potassium: 3.6 mmol/L (ref 3.5–5.3)
SODIUM: 138 mmol/L (ref 135–146)

## 2015-08-29 NOTE — Telephone Encounter (Signed)
-----   Message from [facility], [place] sent at 08/29/2015  2:10 PM EST ----- Please let the patient know his labs look stable, no changes.  Please repeat BMET at his wound check appointment.  Thank Tommye Standard, PA-C

## 2015-08-30 ENCOUNTER — Other Ambulatory Visit: Payer: Self-pay | Admitting: Nurse Practitioner

## 2015-08-30 ENCOUNTER — Ambulatory Visit
Admission: RE | Admit: 2015-08-30 | Discharge: 2015-08-30 | Disposition: A | Discharge status: Home / Self Care | Payer: Medicare Other | Source: Ambulatory Visit | Attending: Nurse Practitioner | Admitting: Nurse Practitioner

## 2015-08-30 DIAGNOSIS — R05 Cough: Secondary | ICD-10-CM

## 2015-08-30 DIAGNOSIS — R059 Cough, unspecified: Secondary | ICD-10-CM

## 2015-09-04 ENCOUNTER — Other Ambulatory Visit (INDEPENDENT_AMBULATORY_CARE_PROVIDER_SITE_OTHER): Payer: Medicare Other | Admitting: *Deleted

## 2015-09-04 ENCOUNTER — Ambulatory Visit (INDEPENDENT_AMBULATORY_CARE_PROVIDER_SITE_OTHER): Payer: Medicare Other | Admitting: *Deleted

## 2015-09-04 ENCOUNTER — Encounter: Payer: Self-pay | Admitting: Cardiology

## 2015-09-04 DIAGNOSIS — R001 Bradycardia, unspecified: Secondary | ICD-10-CM | POA: Diagnosis not present

## 2015-09-04 LAB — CUP PACEART INCLINIC DEVICE CHECK
Brady Statistic AP VP Percent: 64.01 %
Brady Statistic RA Percent Paced: 64.44 %
Brady Statistic RV Percent Paced: 96.57 %
Date Time Interrogation Session: 20161228115221
Implantable Lead Implant Date: 20161216
Implantable Lead Location: 753860
Implantable Lead Model: 5076
Lead Channel Impedance Value: 475 Ohm
Lead Channel Pacing Threshold Amplitude: 0.5 V
Lead Channel Pacing Threshold Pulse Width: 0.4 ms
Lead Channel Pacing Threshold Pulse Width: 0.4 ms
Lead Channel Sensing Intrinsic Amplitude: 3.125 mV
Lead Channel Sensing Intrinsic Amplitude: 8.375 mV
Lead Channel Setting Pacing Pulse Width: 0.4 ms
Lead Channel Setting Sensing Sensitivity: 2 mV
MDC IDC LEAD IMPLANT DT: 20161216
MDC IDC LEAD LOCATION: 753859
MDC IDC MSMT BATTERY VOLTAGE: 3.02 V
MDC IDC MSMT LEADCHNL RA IMPEDANCE VALUE: 342 Ohm
MDC IDC MSMT LEADCHNL RA IMPEDANCE VALUE: 361 Ohm
MDC IDC MSMT LEADCHNL RA SENSING INTR AMPL: 3.25 mV
MDC IDC MSMT LEADCHNL RV IMPEDANCE VALUE: 418 Ohm
MDC IDC MSMT LEADCHNL RV PACING THRESHOLD AMPLITUDE: 0.625 V
MDC IDC MSMT LEADCHNL RV SENSING INTR AMPL: 4.625 mV
MDC IDC SET LEADCHNL RA PACING AMPLITUDE: 3.5 V
MDC IDC SET LEADCHNL RV PACING AMPLITUDE: 3.5 V
MDC IDC STAT BRADY AP VS PERCENT: 0.43 %
MDC IDC STAT BRADY AS VP PERCENT: 32.56 %
MDC IDC STAT BRADY AS VS PERCENT: 3 %

## 2015-09-04 LAB — BASIC METABOLIC PANEL
BUN: 23 mg/dL (ref 7–25)
CHLORIDE: 101 mmol/L (ref 98–110)
CO2: 24 mmol/L (ref 20–31)
CREATININE: 1.12 mg/dL — AB (ref 0.70–1.11)
Calcium: 9.2 mg/dL (ref 8.6–10.3)
Glucose, Bld: 105 mg/dL — ABNORMAL HIGH (ref 65–99)
POTASSIUM: 4.3 mmol/L (ref 3.5–5.3)
SODIUM: 141 mmol/L (ref 135–146)

## 2015-09-04 NOTE — Addendum Note (Signed)
Addended by: Eulis Foster on: 09/04/2015 07:51 AM   Modules accepted: Orders

## 2015-09-04 NOTE — Progress Notes (Signed)
Wound check appointment. Steri-strips removed. Wound without redness or edema. Incision edges approximated, wound well healed. Normal device function. Thresholds, sensing, and impedances consistent with implant measurements. Device programmed at 3.5V/auto capture programmed on for extra safety margin until 3 month visit. Histogram distribution appropriate for patient and level of activity. AT/AF <0.1% of time, no EGMs or episodes. No high ventricular rates noted. Turned MVP on due to intrinsic conduction today. Patient educated about wound care, arm mobility, lifting restrictions. ROV w/ WC 11/25/15.

## 2015-09-05 LAB — CBC WITH DIFFERENTIAL/PLATELET
BASOS ABS: 0.1 10*3/uL (ref 0.0–0.1)
Basophils Relative: 1 % (ref 0–1)
EOS ABS: 0.2 10*3/uL (ref 0.0–0.7)
EOS PCT: 3 % (ref 0–5)
HCT: 32.3 % — ABNORMAL LOW (ref 39.0–52.0)
Hemoglobin: 10.7 g/dL — ABNORMAL LOW (ref 13.0–17.0)
LYMPHS PCT: 15 % (ref 12–46)
Lymphs Abs: 1.1 10*3/uL (ref 0.7–4.0)
MCH: 30.7 pg (ref 26.0–34.0)
MCHC: 33.1 g/dL (ref 30.0–36.0)
MCV: 92.8 fL (ref 78.0–100.0)
MPV: 9.3 fL (ref 8.6–12.4)
Monocytes Absolute: 0.5 10*3/uL (ref 0.1–1.0)
Monocytes Relative: 7 % (ref 3–12)
NEUTROS ABS: 5.3 10*3/uL (ref 1.7–7.7)
NEUTROS PCT: 74 % (ref 43–77)
PLATELETS: 403 10*3/uL — AB (ref 150–400)
RBC: 3.48 MIL/uL — AB (ref 4.22–5.81)
RDW: 15.7 % — AB (ref 11.5–15.5)
WBC: 7.1 10*3/uL (ref 4.0–10.5)

## 2015-09-06 ENCOUNTER — Telehealth: Payer: Self-pay | Admitting: *Deleted

## 2015-09-06 NOTE — Telephone Encounter (Signed)
-----   Message from [facility], [place] sent at 09/05/2015 10:29 AM EST ----- Please let the patient know his labs are stable, look OK.  Thanks, Tommye Standard, PA-C

## 2015-09-12 ENCOUNTER — Telehealth: Payer: Self-pay | Admitting: Cardiology

## 2015-09-12 NOTE — Telephone Encounter (Signed)
New message  [facility] nurse called states that she is discharging him from home health pt is doing great. The home health physical therapy has discharged him as well. Pt and care giving is asking about him  driving. The discharge paperwork from the hospital says that he is unable to drive until he is cleared with the follow up appt and that is not until march 20 th. Will the pt need to come in sooner or is it ok for him to just drive   Please call the caregiver [name] at [phone]
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
## 2015-09-13 NOTE — Telephone Encounter (Signed)
Called caregiver back to inform him of my mistake from yesterday.  Explained that patient had syncopal episode secondary to CHB.  Because syncope was due to this and rectified with PPM implant - then patient is cleared to drive. He verbalized understanding and thanks me for calling and updating them.  Patient glad to hear he can drive if needed.

## 2015-11-19 ENCOUNTER — Encounter: Payer: Self-pay | Admitting: Cardiology

## 2015-11-19 ENCOUNTER — Ambulatory Visit (INDEPENDENT_AMBULATORY_CARE_PROVIDER_SITE_OTHER): Payer: Medicare Other | Admitting: Cardiology

## 2015-11-19 VITALS — BP 134/70 | HR 67 | Ht 68.0 in | Wt 147.0 lb

## 2015-11-19 DIAGNOSIS — Z95818 Presence of other cardiac implants and grafts: Secondary | ICD-10-CM

## 2015-11-19 DIAGNOSIS — Z950 Presence of cardiac pacemaker: Secondary | ICD-10-CM | POA: Diagnosis not present

## 2015-11-19 DIAGNOSIS — R001 Bradycardia, unspecified: Secondary | ICD-10-CM

## 2015-11-19 DIAGNOSIS — Z959 Presence of cardiac and vascular implant and graft, unspecified: Secondary | ICD-10-CM

## 2015-11-19 LAB — CUP PACEART INCLINIC DEVICE CHECK
Brady Statistic AP VP Percent: 0.1 %
Brady Statistic AP VS Percent: 90.97 %
Brady Statistic RA Percent Paced: 91.07 %
Brady Statistic RV Percent Paced: 0.12 %
Implantable Lead Implant Date: 20161216
Implantable Lead Location: 753860
Implantable Lead Model: 5076
Lead Channel Impedance Value: 456 Ohm
Lead Channel Impedance Value: 532 Ohm
Lead Channel Pacing Threshold Amplitude: 0.75 V
Lead Channel Pacing Threshold Amplitude: 0.75 V
Lead Channel Pacing Threshold Pulse Width: 0.4 ms
Lead Channel Sensing Intrinsic Amplitude: 12.5 mV
Lead Channel Setting Pacing Amplitude: 1.5 V
Lead Channel Setting Pacing Pulse Width: 0.4 ms
Lead Channel Setting Sensing Sensitivity: 2 mV
MDC IDC LEAD IMPLANT DT: 20161216
MDC IDC LEAD LOCATION: 753859
MDC IDC MSMT BATTERY REMAINING LONGEVITY: 92 mo
MDC IDC MSMT BATTERY VOLTAGE: 3.02 V
MDC IDC MSMT LEADCHNL RA IMPEDANCE VALUE: 342 Ohm
MDC IDC MSMT LEADCHNL RA IMPEDANCE VALUE: 456 Ohm
MDC IDC MSMT LEADCHNL RA SENSING INTR AMPL: 3 mV
MDC IDC MSMT LEADCHNL RV PACING THRESHOLD PULSEWIDTH: 0.4 ms
MDC IDC SESS DTM: 20170314123144
MDC IDC SET LEADCHNL RV PACING AMPLITUDE: 2 V
MDC IDC STAT BRADY AS VP PERCENT: 0.02 %
MDC IDC STAT BRADY AS VS PERCENT: 8.91 %

## 2015-11-19 MED ORDER — METOPROLOL TARTRATE 50 MG PO TABS: 50.0000 mg | ORAL_TABLET | Freq: Two times a day (BID) | ORAL | Status: DC
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
## 2015-11-19 NOTE — Patient Instructions (Addendum)
Medication Instructions:  Your physician has recommended you make the following change in your medication:  1) INCREASE your Metoprolol tartrate to 50 mg twice a day.  - If you need a refill on your cardiac medications before your next appointment, please call your pharmacy.  Labwork: None ordered  Testing/Procedures: None ordered  Follow-Up: Remote monitoring is used to monitor your Pacemaker of ICD from home. This monitoring reduces the number of office visits required to check your device to one time per year. It allows [place] to keep an eye on the functioning of your device to ensure it is working properly. You are scheduled for a device check from home on 02/18/2016. You may send your transmission at any time that day. If you have a wireless device, the transmission will be sent automatically. After your physician reviews your transmission, you will receive a postcard with your next transmission date.  Your physician wants you to follow-up in: 9 months with Dr. Curt Bears. You will receive a reminder letter in the mail two months in advance. If you don't receive a letter, please call our office to schedule the follow-up appointment.   Thank you for choosing [facility]!!   Trinidad Curet, RN [phone]

## 2015-11-19 NOTE — Addendum Note (Signed)
Addended by: Stanton Kidney on: 11/19/2015 12:14 PM   Modules accepted: Orders

## 2015-11-25 ENCOUNTER — Encounter: Payer: Medicare Other | Admitting: Cardiology

## 2015-12-11 ENCOUNTER — Ambulatory Visit: Payer: Medicare Other | Admitting: Physician Assistant

## 2015-12-12 ENCOUNTER — Ambulatory Visit (INDEPENDENT_AMBULATORY_CARE_PROVIDER_SITE_OTHER): Payer: Medicare Other | Admitting: Physician Assistant

## 2015-12-12 ENCOUNTER — Encounter: Payer: Self-pay | Admitting: Physician Assistant

## 2015-12-12 ENCOUNTER — Other Ambulatory Visit: Payer: Self-pay | Admitting: Physician Assistant

## 2015-12-12 VITALS — BP 110/60 | HR 60 | Ht 68.0 in | Wt 146.4 lb

## 2015-12-12 DIAGNOSIS — I2510 Atherosclerotic heart disease of native coronary artery without angina pectoris: Secondary | ICD-10-CM

## 2015-12-12 DIAGNOSIS — Z952 Presence of prosthetic heart valve: Secondary | ICD-10-CM

## 2015-12-12 DIAGNOSIS — Z950 Presence of cardiac pacemaker: Secondary | ICD-10-CM | POA: Diagnosis not present

## 2015-12-12 DIAGNOSIS — Z9889 Other specified postprocedural states: Secondary | ICD-10-CM | POA: Diagnosis not present

## 2015-12-12 DIAGNOSIS — Z8679 Personal history of other diseases of the circulatory system: Secondary | ICD-10-CM

## 2015-12-12 DIAGNOSIS — Z954 Presence of other heart-valve replacement: Secondary | ICD-10-CM | POA: Diagnosis not present

## 2015-12-12 DIAGNOSIS — I10 Essential (primary) hypertension: Secondary | ICD-10-CM

## 2015-12-12 DIAGNOSIS — E785 Hyperlipidemia, unspecified: Secondary | ICD-10-CM

## 2015-12-12 NOTE — Telephone Encounter (Signed)
Medication Detail      Disp Refills Start End     metoprolol tartrate (LOPRESSOR) 50 MG tablet 30 tablet 3 11/19/2015     Sig - Route: Take 1 tablet (50 mg total) by mouth 2 (two) times daily. - Oral    Notes to Pharmacy: Increased dosage. Patient does not need filled at this time.    E-Prescribing Status: Receipt confirmed by pharmacy (11/19/2015 12:13 PM EDT)     Pharmacy    [id number], [place]
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
## 2015-12-12 NOTE — Patient Instructions (Addendum)
Medication Instructions:  Your physician recommends that you continue on your current medications as directed. Please refer to the Current Medication list given to you today. Labwork: NONE Testing/Procedures: NONE Follow-Up: Your physician wants you to follow-up in: [place].  You will receive a reminder letter in the mail two months in advance. If you don't receive a letter, please call our office to schedule the follow-up appointment. Any Other Special Instructions Will Be Listed Below (If Applicable). If you need a refill on your cardiac medications before your next appointment, please call your pharmacy.

## 2016-02-04 ENCOUNTER — Other Ambulatory Visit: Payer: Self-pay | Admitting: *Deleted

## 2016-02-04 MED ORDER — METOPROLOL TARTRATE 50 MG PO TABS: 50.0000 mg | ORAL_TABLET | Freq: Two times a day (BID) | ORAL | Status: DC

## 2016-02-18 ENCOUNTER — Ambulatory Visit (INDEPENDENT_AMBULATORY_CARE_PROVIDER_SITE_OTHER): Payer: Medicare Other | Admitting: *Deleted

## 2016-02-18 DIAGNOSIS — Z959 Presence of cardiac and vascular implant and graft, unspecified: Secondary | ICD-10-CM

## 2016-02-18 DIAGNOSIS — Z95818 Presence of other cardiac implants and grafts: Secondary | ICD-10-CM

## 2016-02-18 DIAGNOSIS — R001 Bradycardia, unspecified: Secondary | ICD-10-CM | POA: Diagnosis not present

## 2016-02-18 NOTE — Progress Notes (Signed)
Remote pacemaker transmission.   

## 2016-02-20 LAB — CUP PACEART REMOTE DEVICE CHECK
Battery Remaining Longevity: 95 mo
Brady Statistic AP VP Percent: 0.08 %
Brady Statistic AP VS Percent: 97.88 %
Brady Statistic AS VP Percent: 0 %
Brady Statistic AS VS Percent: 2.04 %
Brady Statistic RV Percent Paced: 0.08 %
Date Time Interrogation Session: 20170613144354
Implantable Lead Implant Date: 20161216
Implantable Lead Location: 753859
Lead Channel Impedance Value: 437 Ohm
Lead Channel Impedance Value: 475 Ohm
Lead Channel Impedance Value: 513 Ohm
Lead Channel Pacing Threshold Amplitude: 0.875 V
Lead Channel Sensing Intrinsic Amplitude: 1.375 mV
Lead Channel Sensing Intrinsic Amplitude: 9.5 mV
Lead Channel Sensing Intrinsic Amplitude: 9.5 mV
Lead Channel Setting Pacing Amplitude: 1.75 V
Lead Channel Setting Pacing Amplitude: 2 V
Lead Channel Setting Pacing Pulse Width: 0.4 ms
Lead Channel Setting Sensing Sensitivity: 2 mV
MDC IDC LEAD IMPLANT DT: 20161216
MDC IDC LEAD LOCATION: 753860
MDC IDC MSMT BATTERY VOLTAGE: 3.01 V
MDC IDC MSMT LEADCHNL RA IMPEDANCE VALUE: 342 Ohm
MDC IDC MSMT LEADCHNL RA PACING THRESHOLD AMPLITUDE: 0.875 V
MDC IDC MSMT LEADCHNL RA PACING THRESHOLD PULSEWIDTH: 0.4 ms
MDC IDC MSMT LEADCHNL RA SENSING INTR AMPL: 1.375 mV
MDC IDC MSMT LEADCHNL RV PACING THRESHOLD PULSEWIDTH: 0.4 ms
MDC IDC STAT BRADY RA PERCENT PACED: 97.96 %

## 2016-02-26 ENCOUNTER — Encounter: Payer: Self-pay | Admitting: Cardiology

## 2016-05-14 ENCOUNTER — Encounter: Payer: Self-pay | Admitting: Family

## 2016-05-19 ENCOUNTER — Encounter: Payer: Self-pay | Admitting: Family

## 2016-05-19 ENCOUNTER — Ambulatory Visit (INDEPENDENT_AMBULATORY_CARE_PROVIDER_SITE_OTHER): Payer: Medicare Other | Admitting: *Deleted

## 2016-05-19 ENCOUNTER — Telehealth: Payer: Self-pay | Admitting: Cardiology

## 2016-05-19 ENCOUNTER — Ambulatory Visit (INDEPENDENT_AMBULATORY_CARE_PROVIDER_SITE_OTHER): Payer: Medicare Other | Admitting: Family

## 2016-05-19 ENCOUNTER — Ambulatory Visit (HOSPITAL_COMMUNITY)
Admission: RE | Admit: 2016-05-19 | Discharge: 2016-05-19 | Disposition: A | Discharge status: Home / Self Care | Payer: Medicare Other | Source: Ambulatory Visit | Attending: Family | Admitting: Family

## 2016-05-19 VITALS — BP 157/76 | HR 60 | Ht 68.0 in | Wt 146.5 lb

## 2016-05-19 DIAGNOSIS — I714 Abdominal aortic aneurysm, without rupture, unspecified: Secondary | ICD-10-CM

## 2016-05-19 DIAGNOSIS — R001 Bradycardia, unspecified: Secondary | ICD-10-CM | POA: Diagnosis not present

## 2016-05-19 DIAGNOSIS — Z95828 Presence of other vascular implants and grafts: Secondary | ICD-10-CM

## 2016-05-19 DIAGNOSIS — I723 Aneurysm of iliac artery: Secondary | ICD-10-CM | POA: Diagnosis not present

## 2016-05-19 NOTE — Progress Notes (Signed)
Remote pacemaker transmission.   
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
## 2016-05-19 NOTE — Telephone Encounter (Signed)
LMOVM reminding pt to send remote transmission.   

## 2016-05-21 ENCOUNTER — Encounter: Payer: Self-pay | Admitting: Cardiology

## 2016-05-28 LAB — CUP PACEART REMOTE DEVICE CHECK
Battery Remaining Longevity: 97 mo
Battery Voltage: 3.01 V
Brady Statistic AP VP Percent: 0.08 %
Brady Statistic AP VS Percent: 98.66 %
Brady Statistic AS VP Percent: 0 %
Brady Statistic AS VS Percent: 1.27 %
Brady Statistic RA Percent Paced: 98.73 %
Brady Statistic RV Percent Paced: 0.08 %
Date Time Interrogation Session: 20170912172836
Implantable Lead Implant Date: 20161216
Implantable Lead Implant Date: 20161216
Implantable Lead Location: 753859
Implantable Lead Location: 753860
Implantable Lead Model: 5076
Implantable Lead Model: 5076
Lead Channel Impedance Value: 342 Ohm
Lead Channel Impedance Value: 399 Ohm
Lead Channel Impedance Value: 437 Ohm
Lead Channel Impedance Value: 513 Ohm
Lead Channel Pacing Threshold Amplitude: 0.625 V
Lead Channel Pacing Threshold Amplitude: 0.75 V
Lead Channel Pacing Threshold Pulse Width: 0.4 ms
Lead Channel Pacing Threshold Pulse Width: 0.4 ms
Lead Channel Sensing Intrinsic Amplitude: 2.625 mV
Lead Channel Sensing Intrinsic Amplitude: 2.625 mV
Lead Channel Sensing Intrinsic Amplitude: 9.5 mV
Lead Channel Sensing Intrinsic Amplitude: 9.5 mV
Lead Channel Setting Pacing Amplitude: 1.5 V
Lead Channel Setting Pacing Amplitude: 2 V
Lead Channel Setting Pacing Pulse Width: 0.4 ms
Lead Channel Setting Sensing Sensitivity: 2 mV

## 2016-07-24 ENCOUNTER — Other Ambulatory Visit: Payer: Self-pay | Admitting: *Deleted

## 2016-07-24 MED ORDER — METOPROLOL TARTRATE 50 MG PO TABS: 50.0000 mg | ORAL_TABLET | Freq: Two times a day (BID) | ORAL | 0 refills | Status: DC

## 2016-08-05 ENCOUNTER — Other Ambulatory Visit: Payer: Self-pay

## 2016-08-11 NOTE — Addendum Note (Signed)
Addended by: Lianne Cure A on: 08/11/2016 11:47 AM   Modules accepted: Orders

## 2016-08-18 ENCOUNTER — Ambulatory Visit (INDEPENDENT_AMBULATORY_CARE_PROVIDER_SITE_OTHER): Payer: Medicare Other | Admitting: *Deleted

## 2016-08-18 DIAGNOSIS — R001 Bradycardia, unspecified: Secondary | ICD-10-CM

## 2016-08-18 NOTE — Progress Notes (Signed)
Remote pacemaker transmission.   

## 2016-08-26 ENCOUNTER — Encounter: Payer: Self-pay | Admitting: Cardiology

## 2016-09-01 LAB — CUP PACEART REMOTE DEVICE CHECK
Brady Statistic AP VP Percent: 0.11 %
Brady Statistic AP VS Percent: 91.87 %
Brady Statistic AS VP Percent: 0.01 %
Brady Statistic RA Percent Paced: 89.41 %
Implantable Lead Implant Date: 20161216
Implantable Lead Location: 753859
Implantable Lead Model: 5076
Lead Channel Impedance Value: 342 Ohm
Lead Channel Impedance Value: 380 Ohm
Lead Channel Impedance Value: 418 Ohm
Lead Channel Pacing Threshold Amplitude: 0.75 V
Lead Channel Pacing Threshold Pulse Width: 0.4 ms
Lead Channel Pacing Threshold Pulse Width: 0.4 ms
Lead Channel Sensing Intrinsic Amplitude: 10.875 mV
Lead Channel Sensing Intrinsic Amplitude: 10.875 mV
Lead Channel Sensing Intrinsic Amplitude: 3 mV
Lead Channel Setting Pacing Amplitude: 1.5 V
Lead Channel Setting Pacing Amplitude: 2 V
Lead Channel Setting Pacing Pulse Width: 0.4 ms
MDC IDC LEAD IMPLANT DT: 20161216
MDC IDC LEAD LOCATION: 753860
MDC IDC MSMT BATTERY REMAINING LONGEVITY: 97 mo
MDC IDC MSMT BATTERY VOLTAGE: 3.01 V
MDC IDC MSMT LEADCHNL RA PACING THRESHOLD AMPLITUDE: 0.5 V
MDC IDC MSMT LEADCHNL RA SENSING INTR AMPL: 3 mV
MDC IDC MSMT LEADCHNL RV IMPEDANCE VALUE: 494 Ohm
MDC IDC PG IMPLANT DT: 20161216
MDC IDC SESS DTM: 20171212180015
MDC IDC SET LEADCHNL RV SENSING SENSITIVITY: 2 mV
MDC IDC STAT BRADY AS VS PERCENT: 8.01 %
MDC IDC STAT BRADY RV PERCENT PACED: 0.13 %

## 2016-11-19 ENCOUNTER — Other Ambulatory Visit: Payer: Self-pay | Admitting: Geriatric Medicine

## 2016-11-19 ENCOUNTER — Ambulatory Visit
Admission: RE | Admit: 2016-11-19 | Discharge: 2016-11-19 | Disposition: A | Discharge status: Home / Self Care | Payer: Medicare Other | Source: Ambulatory Visit | Attending: Geriatric Medicine | Admitting: Geriatric Medicine

## 2016-11-19 DIAGNOSIS — M546 Pain in thoracic spine: Secondary | ICD-10-CM

## 2016-11-23 ENCOUNTER — Encounter: Payer: Self-pay | Admitting: Cardiology

## 2016-11-23 ENCOUNTER — Ambulatory Visit (INDEPENDENT_AMBULATORY_CARE_PROVIDER_SITE_OTHER): Payer: Medicare Other | Admitting: Cardiology

## 2016-11-23 VITALS — BP 96/62 | HR 60 | Ht 67.5 in | Wt 153.0 lb

## 2016-11-23 DIAGNOSIS — Z45018 Encounter for adjustment and management of other part of cardiac pacemaker: Secondary | ICD-10-CM

## 2016-11-23 DIAGNOSIS — Z950 Presence of cardiac pacemaker: Secondary | ICD-10-CM | POA: Diagnosis not present

## 2016-11-23 DIAGNOSIS — R001 Bradycardia, unspecified: Secondary | ICD-10-CM

## 2016-11-23 LAB — CUP PACEART INCLINIC DEVICE CHECK
Brady Statistic AP VP Percent: 0.14 %
Brady Statistic AP VS Percent: 96.72 %
Brady Statistic AS VP Percent: 0.01 %
Brady Statistic RA Percent Paced: 95.91 %
Brady Statistic RV Percent Paced: 0.16 %
Date Time Interrogation Session: 20180319122432
Implantable Lead Implant Date: 20161216
Implantable Lead Location: 753859
Implantable Lead Location: 753860
Implantable Lead Model: 5076
Lead Channel Impedance Value: 399 Ohm
Lead Channel Impedance Value: 475 Ohm
Lead Channel Impedance Value: 475 Ohm
Lead Channel Pacing Threshold Amplitude: 0.75 V
Lead Channel Pacing Threshold Pulse Width: 0.4 ms
Lead Channel Sensing Intrinsic Amplitude: 15.375 mV
Lead Channel Setting Pacing Amplitude: 2 V
Lead Channel Setting Pacing Amplitude: 2.5 V
Lead Channel Setting Pacing Pulse Width: 0.4 ms
Lead Channel Setting Sensing Sensitivity: 2 mV
MDC IDC LEAD IMPLANT DT: 20161216
MDC IDC MSMT BATTERY REMAINING LONGEVITY: 95 mo
MDC IDC MSMT BATTERY VOLTAGE: 3.01 V
MDC IDC MSMT LEADCHNL RA IMPEDANCE VALUE: 361 Ohm
MDC IDC MSMT LEADCHNL RA PACING THRESHOLD PULSEWIDTH: 0.4 ms
MDC IDC MSMT LEADCHNL RA SENSING INTR AMPL: 2.5 mV
MDC IDC MSMT LEADCHNL RV PACING THRESHOLD AMPLITUDE: 0.75 V
MDC IDC PG IMPLANT DT: 20161216
MDC IDC STAT BRADY AS VS PERCENT: 3.13 %

## 2016-11-23 NOTE — Patient Instructions (Signed)
Medication Instructions:    Your physician recommends that you continue on your current medications as directed. Please refer to the Current Medication list given to you today.  --- If you need a refill on your cardiac medications before your next appointment, please call your pharmacy. ---  Labwork:  None ordered  Testing/Procedures:  None ordered  Follow-Up: Remote monitoring is used to monitor your Pacemaker of ICD from home. This monitoring reduces the number of office visits required to check your device to one time per year. It allows [place] to keep an eye on the functioning of your device to ensure it is working properly. You are scheduled for a device check from home on 02/22/2017. You may send your transmission at any time that day. If you have a wireless device, the transmission will be sent automatically. After your physician reviews your transmission, you will receive a postcard with your next transmission date.   Your physician wants you to follow-up in: 1 year with Dr. Curt Bears.  You will receive a reminder letter in the mail two months in advance. If you don't receive a letter, please call our office to schedule the follow-up appointment.  Thank you for choosing [facility]!!   Trinidad Curet, RN [phone]
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
## 2017-01-06 ENCOUNTER — Encounter: Payer: Self-pay | Admitting: Podiatry

## 2017-01-06 ENCOUNTER — Ambulatory Visit: Payer: Medicare Other

## 2017-01-06 ENCOUNTER — Ambulatory Visit (INDEPENDENT_AMBULATORY_CARE_PROVIDER_SITE_OTHER): Payer: Medicare Other | Admitting: Podiatry

## 2017-01-06 DIAGNOSIS — M79671 Pain in right foot: Secondary | ICD-10-CM

## 2017-01-06 DIAGNOSIS — B351 Tinea unguium: Secondary | ICD-10-CM

## 2017-01-08 NOTE — Progress Notes (Signed)
   SUBJECTIVE Patient presents to office today complaining of elongated, thickened nails. Pain while ambulating in shoes. Patient is unable to trim their own nails. He also reports a skin lesion to the plantar aspect of the right lateral fifth toe onset 2 weeks ago. He reports associated pain and redness. He states he was prescribed Cephalexin for 10 days by his PCP that helped improve the redness. Wearing shoes increases his pain. He denies alleviating factors  OBJECTIVE General Patient is awake, alert, and oriented x 3 and in no acute distress. Derm Skin is dry and supple bilateral. Negative open lesions or macerations. Remaining integument unremarkable. Nails are tender, long, thickened and dystrophic with subungual debris, consistent with onychomycosis, 1-5 bilateral. No signs of infection noted. Vasc  DP and PT pedal pulses palpable bilaterally. Temperature gradient within normal limits.  Neuro Epicritic and protective threshold sensation diminished bilaterally.  Musculoskeletal Exam No symptomatic pedal deformities noted bilateral. Muscular strength within normal limits.  ASSESSMENT 1. Onychodystrophic nails 1-5 bilateral with hyperkeratosis of nails.  2. Onychomycosis of nail due to dermatophyte bilateral 3. Pain in foot bilateral 4. Cellulitis right fifth toe-resolved  PLAN OF CARE 1. Patient evaluated today.  2. Instructed to maintain good pedal hygiene and foot care.  3. Mechanical debridement of nails 1-5 bilaterally performed using a nail nipper. Filed with dremel without incident.  4. Return to clinic when necessary.    Edrick Kins, DPM [facility]  Dr. Edrick Kins, [place]                                        [place], [place] [address]                Office [phone]  Fax [phone]

## 2017-02-08 ENCOUNTER — Other Ambulatory Visit: Payer: Self-pay | Admitting: Cardiology

## 2017-02-22 ENCOUNTER — Telehealth: Payer: Self-pay | Admitting: Cardiology

## 2017-02-22 ENCOUNTER — Ambulatory Visit (INDEPENDENT_AMBULATORY_CARE_PROVIDER_SITE_OTHER): Payer: Medicare Other | Admitting: *Deleted

## 2017-02-22 DIAGNOSIS — R001 Bradycardia, unspecified: Secondary | ICD-10-CM | POA: Diagnosis not present

## 2017-02-22 NOTE — Telephone Encounter (Signed)
Confirmed remote transmission w/ pt family member.   

## 2017-02-23 ENCOUNTER — Telehealth: Payer: Self-pay

## 2017-02-23 LAB — CUP PACEART REMOTE DEVICE CHECK
Battery Remaining Longevity: 85 mo
Brady Statistic AP VS Percent: 91.39 %
Brady Statistic AS VP Percent: 2.95 %
Brady Statistic AS VS Percent: 5.11 %
Brady Statistic RA Percent Paced: 87.47 %
Date Time Interrogation Session: 20180618194913
Implantable Lead Implant Date: 20161216
Implantable Lead Location: 753859
Implantable Lead Location: 753860
Implantable Lead Model: 5076
Lead Channel Impedance Value: 342 Ohm
Lead Channel Pacing Threshold Pulse Width: 0.4 ms
Lead Channel Pacing Threshold Pulse Width: 0.4 ms
Lead Channel Sensing Intrinsic Amplitude: 1.25 mV
Lead Channel Sensing Intrinsic Amplitude: 1.25 mV
Lead Channel Sensing Intrinsic Amplitude: 11.25 mV
Lead Channel Sensing Intrinsic Amplitude: 11.25 mV
Lead Channel Setting Pacing Amplitude: 2.5 V
Lead Channel Setting Pacing Pulse Width: 0.4 ms
MDC IDC LEAD IMPLANT DT: 20161216
MDC IDC MSMT BATTERY VOLTAGE: 3.01 V
MDC IDC MSMT LEADCHNL RA IMPEDANCE VALUE: 456 Ohm
MDC IDC MSMT LEADCHNL RA PACING THRESHOLD AMPLITUDE: 0.625 V
MDC IDC MSMT LEADCHNL RV IMPEDANCE VALUE: 399 Ohm
MDC IDC MSMT LEADCHNL RV IMPEDANCE VALUE: 475 Ohm
MDC IDC MSMT LEADCHNL RV PACING THRESHOLD AMPLITUDE: 0.625 V
MDC IDC PG IMPLANT DT: 20161216
MDC IDC SET LEADCHNL RA PACING AMPLITUDE: 2 V
MDC IDC SET LEADCHNL RV SENSING SENSITIVITY: 2 mV
MDC IDC STAT BRADY AP VP PERCENT: 0.55 %
MDC IDC STAT BRADY RV PERCENT PACED: 3.56 %

## 2017-02-23 NOTE — Progress Notes (Signed)
Remote pacemaker transmission.   

## 2017-02-23 NOTE — Telephone Encounter (Signed)
-----   Message from Will Meredith Leeds, MD sent at 02/23/2017 12:23 PM EDT ----- Abnormal device interrogation reviewed.  Lead parameters and battery status stable.  AF found on monitor. Needs follow up in clinic to discuss.

## 2017-02-23 NOTE — Telephone Encounter (Signed)
Spoke with patient's caregiver and scheduled appointment to discuss new afib on pacemaker.

## 2017-02-25 ENCOUNTER — Encounter: Payer: Self-pay | Admitting: Cardiology

## 2017-03-17 ENCOUNTER — Encounter: Payer: Medicare Other | Admitting: Cardiology

## 2017-03-22 ENCOUNTER — Ambulatory Visit (INDEPENDENT_AMBULATORY_CARE_PROVIDER_SITE_OTHER): Payer: Medicare Other | Admitting: Cardiology

## 2017-03-22 ENCOUNTER — Encounter: Payer: Self-pay | Admitting: Cardiology

## 2017-03-22 ENCOUNTER — Encounter (INDEPENDENT_AMBULATORY_CARE_PROVIDER_SITE_OTHER): Payer: Self-pay

## 2017-03-22 VITALS — BP 146/86 | HR 82 | Ht 68.0 in | Wt 152.4 lb

## 2017-03-22 DIAGNOSIS — I480 Paroxysmal atrial fibrillation: Secondary | ICD-10-CM

## 2017-03-22 DIAGNOSIS — R001 Bradycardia, unspecified: Secondary | ICD-10-CM | POA: Diagnosis not present

## 2017-03-22 DIAGNOSIS — Z950 Presence of cardiac pacemaker: Secondary | ICD-10-CM

## 2017-03-22 MED ORDER — APIXABAN 5 MG PO TABS: 5.0000 mg | ORAL_TABLET | Freq: Two times a day (BID) | ORAL | 6 refills | Status: DC

## 2017-03-22 MED ORDER — APIXABAN 5 MG PO TABS: 5.0000 mg | ORAL_TABLET | Freq: Two times a day (BID) | ORAL | 0 refills | Status: DC
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
## 2017-03-22 NOTE — Patient Instructions (Addendum)
Medication Instructions:  Start Eliquis 5 mg twice daily.   --- If you need a refill on your cardiac medications before your next appointment, please call your pharmacy. ---  Labwork:  None ordered  Testing/Procedures:  None ordered  Follow-Up: Remote monitoring is used to monitor your Pacemaker of ICD from home. This monitoring reduces the number of office visits required to check your device to one time per year. It allows [place] to keep an eye on the functioning of your device to ensure it is working properly. You are scheduled for a device check from home on 05/24/2017. You may send your transmission at any time that day. If you have a wireless device, the transmission will be sent automatically. After your physician reviews your transmission, you will receive a postcard with your next transmission date.   Your physician wants you to follow-up in: 6 months with Dr. Curt Bears.  You will receive a reminder letter in the mail two months in advance. If you don't receive a letter, please call our office to schedule the follow-up appointment.  Thank you for choosing [facility]!!   Trinidad Curet, RN [phone]

## 2017-03-23 ENCOUNTER — Telehealth: Payer: Self-pay | Admitting: Cardiology

## 2017-03-23 NOTE — Telephone Encounter (Signed)
New message     Pt was put on Eliquis and the patient has artificial heart valve and a stent , should he be taking this medication ?  Website says that he should not be taking it with him having a artificial heart valve

## 2017-03-24 NOTE — Telephone Encounter (Addendum)
Reviewed w/ pharmacist. Lm informing pt I would discuss/review w/ Dr. Curt Bears tomorrow (he is off today) and call him back tomorrow with an answer.

## 2017-03-25 ENCOUNTER — Encounter: Payer: Self-pay | Admitting: Cardiology

## 2017-03-25 NOTE — Telephone Encounter (Signed)
DPR on file. Informed Mr. [name] [name] that it is ok for the patient to be taking Eliquis, per Dr. Curt Bears.  Over viewed reasoning w/ him and he was appreciative of the information.

## 2017-03-26 ENCOUNTER — Encounter: Payer: Self-pay | Admitting: Cardiology

## 2017-03-26 NOTE — Telephone Encounter (Signed)
Reviewed with pharmacist/Brittany Rosita Fire, PA. Malachi Pro, caregiver, to have PCP evaluate rash/bumps.  Informed that this does not look like a medication rash. [name] thanks me for calling and is agreeable to having pt seen by PCP/urgent care.

## 2017-04-20 LAB — CUP PACEART INCLINIC DEVICE CHECK
Battery Remaining Longevity: 92 mo
Brady Statistic AP VP Percent: 2.27 %
Brady Statistic AS VP Percent: 2.35 %
Brady Statistic RA Percent Paced: 88.8 %
Brady Statistic RV Percent Paced: 4.62 %
Date Time Interrogation Session: 20180716161947
Implantable Lead Implant Date: 20161216
Implantable Lead Location: 753859
Implantable Lead Model: 5076
Implantable Lead Model: 5076
Lead Channel Impedance Value: 342 Ohm
Lead Channel Impedance Value: 399 Ohm
Lead Channel Impedance Value: 399 Ohm
Lead Channel Pacing Threshold Amplitude: 0.5 V
Lead Channel Pacing Threshold Pulse Width: 0.4 ms
Lead Channel Pacing Threshold Pulse Width: 0.4 ms
Lead Channel Sensing Intrinsic Amplitude: 12.625 mV
Lead Channel Sensing Intrinsic Amplitude: 3.375 mV
Lead Channel Setting Pacing Amplitude: 2.5 V
MDC IDC LEAD IMPLANT DT: 20161216
MDC IDC LEAD LOCATION: 753860
MDC IDC MSMT BATTERY VOLTAGE: 3.01 V
MDC IDC MSMT LEADCHNL RV IMPEDANCE VALUE: 475 Ohm
MDC IDC MSMT LEADCHNL RV PACING THRESHOLD AMPLITUDE: 0.75 V
MDC IDC PG IMPLANT DT: 20161216
MDC IDC SET LEADCHNL RA PACING AMPLITUDE: 2 V
MDC IDC SET LEADCHNL RV PACING PULSEWIDTH: 0.4 ms
MDC IDC SET LEADCHNL RV SENSING SENSITIVITY: 2 mV
MDC IDC STAT BRADY AP VS PERCENT: 90.48 %
MDC IDC STAT BRADY AS VS PERCENT: 4.9 %

## 2017-05-21 ENCOUNTER — Ambulatory Visit (INDEPENDENT_AMBULATORY_CARE_PROVIDER_SITE_OTHER): Payer: Medicare Other | Admitting: Family

## 2017-05-21 ENCOUNTER — Encounter: Payer: Self-pay | Admitting: Family

## 2017-05-21 ENCOUNTER — Ambulatory Visit (HOSPITAL_COMMUNITY)
Admission: RE | Admit: 2017-05-21 | Discharge: 2017-05-21 | Disposition: A | Discharge status: Home / Self Care | Payer: Medicare Other | Source: Ambulatory Visit | Attending: Vascular Surgery | Admitting: Vascular Surgery

## 2017-05-21 VITALS — BP 128/74 | HR 62 | Temp 96.8°F | Resp 16 | Ht 68.0 in | Wt 150.0 lb

## 2017-05-21 DIAGNOSIS — Z95828 Presence of other vascular implants and grafts: Secondary | ICD-10-CM | POA: Diagnosis not present

## 2017-05-21 DIAGNOSIS — I723 Aneurysm of iliac artery: Secondary | ICD-10-CM | POA: Diagnosis not present

## 2017-05-21 DIAGNOSIS — I714 Abdominal aortic aneurysm, without rupture, unspecified: Secondary | ICD-10-CM
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
## 2017-05-21 NOTE — Patient Instructions (Signed)
Before your next abdominal ultrasound:  Take two Extra-Strength Gas-X capsules at bedtime the night before the test. Take another two Extra-Strength Gas-X capsules 3 hours before the test.  Avoid gas forming foods the day before the test.       

## 2017-05-24 ENCOUNTER — Ambulatory Visit (INDEPENDENT_AMBULATORY_CARE_PROVIDER_SITE_OTHER): Payer: Medicare Other | Admitting: *Deleted

## 2017-05-24 ENCOUNTER — Telehealth: Payer: Self-pay | Admitting: Cardiology

## 2017-05-24 DIAGNOSIS — R001 Bradycardia, unspecified: Secondary | ICD-10-CM

## 2017-05-24 NOTE — Telephone Encounter (Signed)
Confirmed remote transmission w/ pt friend.   

## 2017-05-25 ENCOUNTER — Other Ambulatory Visit: Payer: Self-pay | Admitting: Cardiology

## 2017-05-25 LAB — CUP PACEART REMOTE DEVICE CHECK
Brady Statistic AP VP Percent: 4.66 %
Brady Statistic AP VS Percent: 94.71 %
Brady Statistic AS VS Percent: 0.58 %
Brady Statistic RV Percent Paced: 4.77 %
Implantable Lead Implant Date: 20161216
Implantable Lead Location: 753859
Implantable Pulse Generator Implant Date: 20161216
Lead Channel Impedance Value: 418 Ohm
Lead Channel Impedance Value: 418 Ohm
Lead Channel Impedance Value: 494 Ohm
Lead Channel Pacing Threshold Amplitude: 0.625 V
Lead Channel Sensing Intrinsic Amplitude: 10.375 mV
Lead Channel Sensing Intrinsic Amplitude: 10.375 mV
Lead Channel Setting Pacing Amplitude: 2 V
Lead Channel Setting Pacing Amplitude: 2.5 V
Lead Channel Setting Sensing Sensitivity: 2 mV
MDC IDC LEAD IMPLANT DT: 20161216
MDC IDC LEAD LOCATION: 753860
MDC IDC MSMT BATTERY REMAINING LONGEVITY: 90 mo
MDC IDC MSMT BATTERY VOLTAGE: 3.01 V
MDC IDC MSMT LEADCHNL RA IMPEDANCE VALUE: 361 Ohm
MDC IDC MSMT LEADCHNL RA PACING THRESHOLD AMPLITUDE: 0.625 V
MDC IDC MSMT LEADCHNL RA PACING THRESHOLD PULSEWIDTH: 0.4 ms
MDC IDC MSMT LEADCHNL RA SENSING INTR AMPL: 1.25 mV
MDC IDC MSMT LEADCHNL RA SENSING INTR AMPL: 1.25 mV
MDC IDC MSMT LEADCHNL RV PACING THRESHOLD PULSEWIDTH: 0.4 ms
MDC IDC SESS DTM: 20180917150014
MDC IDC SET LEADCHNL RV PACING PULSEWIDTH: 0.4 ms
MDC IDC STAT BRADY AS VP PERCENT: 0.05 %
MDC IDC STAT BRADY RA PERCENT PACED: 97.36 %

## 2017-05-25 MED ORDER — POTASSIUM CHLORIDE CRYS ER 20 MEQ PO TBCR: EXTENDED_RELEASE_TABLET | ORAL | 2 refills | Status: DC

## 2017-05-25 NOTE — Progress Notes (Signed)
Remote pacemaker transmission.   

## 2017-05-26 ENCOUNTER — Encounter: Payer: Self-pay | Admitting: Cardiology

## 2017-06-03 NOTE — Addendum Note (Signed)
Addended by: Lianne Cure A on: 06/03/2017 04:55 PM   Modules accepted: Orders

## 2017-08-20 ENCOUNTER — Telehealth: Payer: Self-pay | Admitting: Cardiology

## 2017-08-20 NOTE — Telephone Encounter (Signed)
Follow up     Pt states that Dr. Curt Bears notes need to be sent with the prescription.

## 2017-08-20 NOTE — Telephone Encounter (Signed)
New message    Pt is asking for his Eliquis to be sent to the [facility] is [place].   *STAT* If patient is at the pharmacy, call can be transferred to refill team.   1. Which medications need to be refilled? (please list name of each medication and dose if known) Eliquis   2. Which pharmacy/location (including street and city if local pharmacy) is medication to be sent to? [place] [phone]  3. Do they need a 30 day or 90 day supply? 30 day

## 2017-08-20 NOTE — Telephone Encounter (Signed)
Rx faxed to [facility] per pt request.  Faxed to [phone] Pt made aware.

## 2017-08-23 ENCOUNTER — Telehealth: Payer: Self-pay | Admitting: Cardiology

## 2017-08-23 ENCOUNTER — Ambulatory Visit (INDEPENDENT_AMBULATORY_CARE_PROVIDER_SITE_OTHER): Payer: Medicare Other | Admitting: *Deleted

## 2017-08-23 DIAGNOSIS — R001 Bradycardia, unspecified: Secondary | ICD-10-CM

## 2017-08-23 NOTE — Telephone Encounter (Signed)
Walk In pt Form-patient asking for script to be sent to [facility]. Placed in [facility] doc box.

## 2017-08-23 NOTE — Telephone Encounter (Signed)
Confirmed remote transmission w/ pt caregiver.   

## 2017-08-24 NOTE — Progress Notes (Signed)
Remote pacemaker transmission.   

## 2017-08-24 NOTE — Telephone Encounter (Signed)
Informed that Rx was faxed Friday, 12/14.

## 2017-08-25 ENCOUNTER — Encounter: Payer: Self-pay | Admitting: Cardiology

## 2017-08-25 LAB — CUP PACEART REMOTE DEVICE CHECK
Battery Remaining Longevity: 88 mo
Battery Voltage: 3.01 V
Brady Statistic AS VP Percent: 0 %
Brady Statistic RA Percent Paced: 97.11 %
Date Time Interrogation Session: 20181217210436
Implantable Lead Implant Date: 20161216
Implantable Lead Location: 753860
Implantable Lead Model: 5076
Implantable Lead Model: 5076
Lead Channel Impedance Value: 342 Ohm
Lead Channel Pacing Threshold Amplitude: 0.5 V
Lead Channel Pacing Threshold Pulse Width: 0.4 ms
Lead Channel Pacing Threshold Pulse Width: 0.4 ms
Lead Channel Sensing Intrinsic Amplitude: 1.25 mV
Lead Channel Sensing Intrinsic Amplitude: 1.25 mV
Lead Channel Sensing Intrinsic Amplitude: 9.875 mV
Lead Channel Sensing Intrinsic Amplitude: 9.875 mV
Lead Channel Setting Pacing Amplitude: 2 V
Lead Channel Setting Pacing Pulse Width: 0.4 ms
MDC IDC LEAD IMPLANT DT: 20161216
MDC IDC LEAD LOCATION: 753859
MDC IDC MSMT LEADCHNL RA IMPEDANCE VALUE: 399 Ohm
MDC IDC MSMT LEADCHNL RV IMPEDANCE VALUE: 399 Ohm
MDC IDC MSMT LEADCHNL RV IMPEDANCE VALUE: 475 Ohm
MDC IDC MSMT LEADCHNL RV PACING THRESHOLD AMPLITUDE: 0.75 V
MDC IDC PG IMPLANT DT: 20161216
MDC IDC SET LEADCHNL RV PACING AMPLITUDE: 2.5 V
MDC IDC SET LEADCHNL RV SENSING SENSITIVITY: 2 mV
MDC IDC STAT BRADY AP VP PERCENT: 0.28 %
MDC IDC STAT BRADY AP VS PERCENT: 99.29 %
MDC IDC STAT BRADY AS VS PERCENT: 0.42 %
MDC IDC STAT BRADY RV PERCENT PACED: 0.34 %

## 2017-09-17 ENCOUNTER — Telehealth: Payer: Self-pay | Admitting: Pharmacist

## 2017-09-17 NOTE — Telephone Encounter (Signed)
Eliquis rx and chart notes sent to [facility] attn: Dr Domenica Fail so that pt can receive free Eliquis. Fax [phone].

## 2017-11-10 ENCOUNTER — Other Ambulatory Visit: Payer: Self-pay | Admitting: Cardiology

## 2017-11-22 ENCOUNTER — Telehealth: Payer: Self-pay | Admitting: Cardiology

## 2017-11-22 ENCOUNTER — Ambulatory Visit (INDEPENDENT_AMBULATORY_CARE_PROVIDER_SITE_OTHER): Payer: Medicare Other | Admitting: *Deleted

## 2017-11-22 DIAGNOSIS — R001 Bradycardia, unspecified: Secondary | ICD-10-CM | POA: Diagnosis not present

## 2017-11-22 NOTE — Telephone Encounter (Signed)
LMOVM reminding pt to send remote transmission.   

## 2017-11-23 NOTE — Progress Notes (Signed)
Remote pacemaker transmission.   

## 2017-11-24 ENCOUNTER — Encounter: Payer: Self-pay | Admitting: Cardiology

## 2017-11-30 LAB — CUP PACEART REMOTE DEVICE CHECK
Battery Remaining Longevity: 87 mo
Battery Voltage: 3.01 V
Brady Statistic AP VP Percent: 0.16 %
Brady Statistic AP VS Percent: 99.13 %
Brady Statistic AS VP Percent: 0 %
Brady Statistic AS VS Percent: 0.7 %
Brady Statistic RV Percent Paced: 0.19 %
Implantable Lead Implant Date: 20161216
Implantable Lead Location: 753859
Implantable Lead Model: 5076
Implantable Pulse Generator Implant Date: 20161216
Lead Channel Impedance Value: 380 Ohm
Lead Channel Impedance Value: 399 Ohm
Lead Channel Impedance Value: 475 Ohm
Lead Channel Pacing Threshold Amplitude: 0.5 V
Lead Channel Pacing Threshold Amplitude: 0.75 V
Lead Channel Pacing Threshold Pulse Width: 0.4 ms
Lead Channel Sensing Intrinsic Amplitude: 10.25 mV
Lead Channel Setting Pacing Amplitude: 2 V
Lead Channel Setting Pacing Amplitude: 2.5 V
Lead Channel Setting Sensing Sensitivity: 2 mV
MDC IDC LEAD IMPLANT DT: 20161216
MDC IDC LEAD LOCATION: 753860
MDC IDC MSMT LEADCHNL RA IMPEDANCE VALUE: 342 Ohm
MDC IDC MSMT LEADCHNL RA SENSING INTR AMPL: 1.375 mV
MDC IDC MSMT LEADCHNL RA SENSING INTR AMPL: 1.375 mV
MDC IDC MSMT LEADCHNL RV PACING THRESHOLD PULSEWIDTH: 0.4 ms
MDC IDC MSMT LEADCHNL RV SENSING INTR AMPL: 10.25 mV
MDC IDC SESS DTM: 20190318153208
MDC IDC SET LEADCHNL RV PACING PULSEWIDTH: 0.4 ms
MDC IDC STAT BRADY RA PERCENT PACED: 96.99 %

## 2017-12-03 ENCOUNTER — Encounter: Payer: Self-pay | Admitting: Cardiology

## 2017-12-03 ENCOUNTER — Ambulatory Visit: Payer: Medicare Other | Admitting: Cardiology

## 2017-12-03 VITALS — BP 148/82 | HR 71 | Ht 66.0 in | Wt 156.0 lb

## 2017-12-03 DIAGNOSIS — I10 Essential (primary) hypertension: Secondary | ICD-10-CM

## 2017-12-03 DIAGNOSIS — I25810 Atherosclerosis of coronary artery bypass graft(s) without angina pectoris: Secondary | ICD-10-CM | POA: Diagnosis not present

## 2017-12-03 DIAGNOSIS — Z952 Presence of prosthetic heart valve: Secondary | ICD-10-CM | POA: Diagnosis not present

## 2017-12-03 DIAGNOSIS — I469 Cardiac arrest, cause unspecified: Secondary | ICD-10-CM

## 2017-12-03 DIAGNOSIS — I480 Paroxysmal atrial fibrillation: Secondary | ICD-10-CM

## 2017-12-03 DIAGNOSIS — E785 Hyperlipidemia, unspecified: Secondary | ICD-10-CM | POA: Diagnosis not present

## 2017-12-03 LAB — CUP PACEART INCLINIC DEVICE CHECK
Battery Remaining Longevity: 87 mo
Battery Voltage: 3.01 V
Brady Statistic AS VS Percent: 0.56 %
Implantable Lead Implant Date: 20161216
Implantable Lead Model: 5076
Implantable Pulse Generator Implant Date: 20161216
Lead Channel Impedance Value: 361 Ohm
Lead Channel Impedance Value: 418 Ohm
Lead Channel Pacing Threshold Amplitude: 0.5 V
Lead Channel Pacing Threshold Amplitude: 0.625 V
Lead Channel Pacing Threshold Pulse Width: 0.4 ms
Lead Channel Sensing Intrinsic Amplitude: 1.5 mV
Lead Channel Sensing Intrinsic Amplitude: 11 mV
Lead Channel Sensing Intrinsic Amplitude: 2.375 mV
Lead Channel Setting Pacing Pulse Width: 0.4 ms
MDC IDC LEAD IMPLANT DT: 20161216
MDC IDC LEAD LOCATION: 753859
MDC IDC LEAD LOCATION: 753860
MDC IDC MSMT LEADCHNL RA PACING THRESHOLD PULSEWIDTH: 0.4 ms
MDC IDC MSMT LEADCHNL RV IMPEDANCE VALUE: 418 Ohm
MDC IDC MSMT LEADCHNL RV IMPEDANCE VALUE: 494 Ohm
MDC IDC MSMT LEADCHNL RV SENSING INTR AMPL: 11.625 mV
MDC IDC SESS DTM: 20190329145102
MDC IDC SET LEADCHNL RA PACING AMPLITUDE: 2 V
MDC IDC SET LEADCHNL RV PACING AMPLITUDE: 2.5 V
MDC IDC SET LEADCHNL RV SENSING SENSITIVITY: 2 mV
MDC IDC STAT BRADY AP VP PERCENT: 1.31 %
MDC IDC STAT BRADY AP VS PERCENT: 98.11 %
MDC IDC STAT BRADY AS VP PERCENT: 0.01 %
MDC IDC STAT BRADY RA PERCENT PACED: 97.15 %
MDC IDC STAT BRADY RV PERCENT PACED: 1.37 %

## 2017-12-03 NOTE — Patient Instructions (Signed)
Medication Instructions:  Your physician recommends that you continue on your current medications as directed. Please refer to the Current Medication list given to you today.  *If you need a refill on your cardiac medications before your next appointment, please call your pharmacy*  Labwork: None ordered  Testing/Procedures: None ordered  Follow-Up: Remote monitoring is used to monitor your Pacemaker or ICD from home. This monitoring reduces the number of office visits required to check your device to one time per year. It allows [place] to keep an eye on the functioning of your device to ensure it is working properly. You are scheduled for a device check from home on 02/21/18. You may send your transmission at any time that day. If you have a wireless device, the transmission will be sent automatically. After your physician reviews your transmission, you will receive a postcard with your next transmission date.  Your physician wants you to follow-up in: 1 year with Dr. Curt Bears.  You will receive a reminder letter in the mail two months in advance. If you don't receive a letter, please call our office to schedule the follow-up appointment.  Thank you for choosing [facility]!!   Trinidad Curet, RN [phone]
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
## 2018-02-19 ENCOUNTER — Other Ambulatory Visit: Payer: Self-pay | Admitting: Cardiology

## 2018-02-21 ENCOUNTER — Encounter: Payer: Medicare Other | Admitting: *Deleted

## 2018-02-21 ENCOUNTER — Telehealth: Payer: Self-pay | Admitting: Cardiology

## 2018-02-21 NOTE — Telephone Encounter (Signed)
Confirmed remote transmission w/ pt daughter in law.   

## 2018-02-21 NOTE — Telephone Encounter (Signed)
Eliquis 5mg  refill request received; pt is 84 yrs old, wt-70.8kg, Crea-1.21 via [facility] on 09/20/17, last seen by Dr. Curt Bears on 12/03/17; will send in refill to requested pharmacy.

## 2018-02-23 ENCOUNTER — Encounter: Payer: Self-pay | Admitting: Cardiology

## 2018-02-28 ENCOUNTER — Encounter: Payer: Self-pay | Admitting: Family

## 2018-02-28 ENCOUNTER — Ambulatory Visit (INDEPENDENT_AMBULATORY_CARE_PROVIDER_SITE_OTHER): Payer: Medicare Other | Admitting: *Deleted

## 2018-02-28 DIAGNOSIS — R001 Bradycardia, unspecified: Secondary | ICD-10-CM | POA: Diagnosis not present

## 2018-02-28 NOTE — Progress Notes (Signed)
Remote pacemaker transmission.   

## 2018-03-01 ENCOUNTER — Encounter: Payer: Self-pay | Admitting: Cardiology

## 2018-03-03 LAB — CUP PACEART REMOTE DEVICE CHECK
Brady Statistic AP VP Percent: 0.12 %
Brady Statistic AP VS Percent: 99.51 %
Brady Statistic AS VP Percent: 0 %
Brady Statistic RA Percent Paced: 99.35 %
Implantable Lead Implant Date: 20161216
Implantable Lead Location: 753859
Implantable Lead Model: 5076
Implantable Pulse Generator Implant Date: 20161216
Lead Channel Impedance Value: 342 Ohm
Lead Channel Impedance Value: 380 Ohm
Lead Channel Impedance Value: 399 Ohm
Lead Channel Impedance Value: 475 Ohm
Lead Channel Pacing Threshold Amplitude: 0.625 V
Lead Channel Pacing Threshold Pulse Width: 0.4 ms
Lead Channel Pacing Threshold Pulse Width: 0.4 ms
Lead Channel Sensing Intrinsic Amplitude: 11.125 mV
Lead Channel Sensing Intrinsic Amplitude: 11.125 mV
Lead Channel Setting Pacing Amplitude: 2 V
Lead Channel Setting Pacing Amplitude: 2.5 V
Lead Channel Setting Pacing Pulse Width: 0.4 ms
MDC IDC LEAD IMPLANT DT: 20161216
MDC IDC LEAD LOCATION: 753860
MDC IDC MSMT BATTERY REMAINING LONGEVITY: 77 mo
MDC IDC MSMT BATTERY VOLTAGE: 3.01 V
MDC IDC MSMT LEADCHNL RA PACING THRESHOLD AMPLITUDE: 0.625 V
MDC IDC MSMT LEADCHNL RA SENSING INTR AMPL: 1.125 mV
MDC IDC MSMT LEADCHNL RA SENSING INTR AMPL: 1.125 mV
MDC IDC SESS DTM: 20190622180328
MDC IDC SET LEADCHNL RV SENSING SENSITIVITY: 2 mV
MDC IDC STAT BRADY AS VS PERCENT: 0.38 %
MDC IDC STAT BRADY RV PERCENT PACED: 0.13 %

## 2018-05-03 ENCOUNTER — Other Ambulatory Visit: Payer: Self-pay | Admitting: Cardiology

## 2018-05-23 ENCOUNTER — Ambulatory Visit: Payer: Medicare Other | Admitting: Family

## 2018-05-23 ENCOUNTER — Other Ambulatory Visit (HOSPITAL_COMMUNITY): Payer: Medicare Other

## 2018-05-27 ENCOUNTER — Encounter: Payer: Self-pay | Admitting: Family

## 2018-05-27 ENCOUNTER — Other Ambulatory Visit: Payer: Self-pay

## 2018-05-27 ENCOUNTER — Ambulatory Visit (INDEPENDENT_AMBULATORY_CARE_PROVIDER_SITE_OTHER): Payer: Medicare Other | Admitting: Family

## 2018-05-27 ENCOUNTER — Ambulatory Visit (HOSPITAL_COMMUNITY)
Admission: RE | Admit: 2018-05-27 | Discharge: 2018-05-27 | Disposition: A | Discharge status: Home / Self Care | Payer: Medicare Other | Source: Ambulatory Visit | Attending: Vascular Surgery | Admitting: Vascular Surgery

## 2018-05-27 VITALS — BP 142/84 | HR 64 | Temp 97.0°F | Resp 18 | Ht 68.0 in | Wt 151.0 lb

## 2018-05-27 DIAGNOSIS — I723 Aneurysm of iliac artery: Secondary | ICD-10-CM

## 2018-05-27 DIAGNOSIS — R0989 Other specified symptoms and signs involving the circulatory and respiratory systems: Secondary | ICD-10-CM

## 2018-05-27 DIAGNOSIS — Z9889 Other specified postprocedural states: Secondary | ICD-10-CM | POA: Insufficient documentation

## 2018-05-27 DIAGNOSIS — I714 Abdominal aortic aneurysm, without rupture, unspecified: Secondary | ICD-10-CM

## 2018-05-27 DIAGNOSIS — Z95828 Presence of other vascular implants and grafts: Secondary | ICD-10-CM | POA: Diagnosis not present

## 2018-05-27 DIAGNOSIS — Z87891 Personal history of nicotine dependence: Secondary | ICD-10-CM

## 2018-05-27 DIAGNOSIS — Z8679 Personal history of other diseases of the circulatory system: Secondary | ICD-10-CM
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
## 2018-05-30 ENCOUNTER — Ambulatory Visit (INDEPENDENT_AMBULATORY_CARE_PROVIDER_SITE_OTHER): Payer: Medicare Other | Admitting: *Deleted

## 2018-05-30 ENCOUNTER — Telehealth: Payer: Self-pay | Admitting: Cardiology

## 2018-05-30 DIAGNOSIS — R001 Bradycardia, unspecified: Secondary | ICD-10-CM

## 2018-05-30 NOTE — Telephone Encounter (Signed)
Confirmed remote transmission w/ pt friend.   

## 2018-05-30 NOTE — Progress Notes (Signed)
Remote pacemaker transmission.   

## 2018-05-31 ENCOUNTER — Encounter: Payer: Self-pay | Admitting: Cardiology

## 2018-06-17 LAB — CUP PACEART REMOTE DEVICE CHECK
Battery Voltage: 3.01 V
Brady Statistic AS VP Percent: 0 %
Brady Statistic AS VS Percent: 0.38 %
Brady Statistic RA Percent Paced: 99.49 %
Date Time Interrogation Session: 20190923172707
Implantable Lead Implant Date: 20161216
Implantable Lead Implant Date: 20161216
Implantable Lead Location: 753860
Implantable Lead Model: 5076
Implantable Pulse Generator Implant Date: 20161216
Lead Channel Impedance Value: 342 Ohm
Lead Channel Impedance Value: 399 Ohm
Lead Channel Pacing Threshold Amplitude: 0.5 V
Lead Channel Pacing Threshold Pulse Width: 0.4 ms
Lead Channel Pacing Threshold Pulse Width: 0.4 ms
Lead Channel Sensing Intrinsic Amplitude: 1.375 mV
Lead Channel Setting Pacing Amplitude: 2.5 V
MDC IDC LEAD LOCATION: 753859
MDC IDC MSMT BATTERY REMAINING LONGEVITY: 75 mo
MDC IDC MSMT LEADCHNL RA SENSING INTR AMPL: 1.375 mV
MDC IDC MSMT LEADCHNL RV IMPEDANCE VALUE: 418 Ohm
MDC IDC MSMT LEADCHNL RV IMPEDANCE VALUE: 494 Ohm
MDC IDC MSMT LEADCHNL RV PACING THRESHOLD AMPLITUDE: 0.75 V
MDC IDC MSMT LEADCHNL RV SENSING INTR AMPL: 11 mV
MDC IDC MSMT LEADCHNL RV SENSING INTR AMPL: 11 mV
MDC IDC SET LEADCHNL RA PACING AMPLITUDE: 2 V
MDC IDC SET LEADCHNL RV PACING PULSEWIDTH: 0.4 ms
MDC IDC SET LEADCHNL RV SENSING SENSITIVITY: 2 mV
MDC IDC STAT BRADY AP VP PERCENT: 0.51 %
MDC IDC STAT BRADY AP VS PERCENT: 99.1 %
MDC IDC STAT BRADY RV PERCENT PACED: 0.55 %

## 2018-08-29 ENCOUNTER — Ambulatory Visit (INDEPENDENT_AMBULATORY_CARE_PROVIDER_SITE_OTHER): Payer: Medicare Other

## 2018-08-29 DIAGNOSIS — I441 Atrioventricular block, second degree: Secondary | ICD-10-CM

## 2018-08-29 DIAGNOSIS — R001 Bradycardia, unspecified: Secondary | ICD-10-CM | POA: Diagnosis not present

## 2018-08-29 LAB — CUP PACEART REMOTE DEVICE CHECK
Brady Statistic AP VS Percent: 98.87 %
Brady Statistic AS VP Percent: 0 %
Date Time Interrogation Session: 20191223171907
Implantable Lead Location: 753859
Implantable Lead Model: 5076
Lead Channel Impedance Value: 342 Ohm
Lead Channel Impedance Value: 380 Ohm
Lead Channel Sensing Intrinsic Amplitude: 1 mV
Lead Channel Sensing Intrinsic Amplitude: 9.5 mV
Lead Channel Sensing Intrinsic Amplitude: 9.5 mV
Lead Channel Setting Pacing Amplitude: 2.5 V
Lead Channel Setting Pacing Pulse Width: 0.4 ms
MDC IDC LEAD IMPLANT DT: 20161216
MDC IDC LEAD IMPLANT DT: 20161216
MDC IDC LEAD LOCATION: 753860
MDC IDC MSMT BATTERY REMAINING LONGEVITY: 74 mo
MDC IDC MSMT BATTERY VOLTAGE: 3.01 V
MDC IDC MSMT LEADCHNL RA PACING THRESHOLD AMPLITUDE: 0.5 V
MDC IDC MSMT LEADCHNL RA PACING THRESHOLD PULSEWIDTH: 0.4 ms
MDC IDC MSMT LEADCHNL RA SENSING INTR AMPL: 1 mV
MDC IDC MSMT LEADCHNL RV IMPEDANCE VALUE: 418 Ohm
MDC IDC MSMT LEADCHNL RV IMPEDANCE VALUE: 494 Ohm
MDC IDC MSMT LEADCHNL RV PACING THRESHOLD AMPLITUDE: 0.75 V
MDC IDC MSMT LEADCHNL RV PACING THRESHOLD PULSEWIDTH: 0.4 ms
MDC IDC PG IMPLANT DT: 20161216
MDC IDC SET LEADCHNL RA PACING AMPLITUDE: 2 V
MDC IDC SET LEADCHNL RV SENSING SENSITIVITY: 2 mV
MDC IDC STAT BRADY AP VP PERCENT: 0.19 %
MDC IDC STAT BRADY AS VS PERCENT: 0.94 %
MDC IDC STAT BRADY RA PERCENT PACED: 98.92 %
MDC IDC STAT BRADY RV PERCENT PACED: 0.21 %

## 2018-08-30 NOTE — Progress Notes (Signed)
Remote pacemaker transmission.   

## 2018-10-11 ENCOUNTER — Telehealth: Payer: Self-pay | Admitting: Pharmacist

## 2018-10-11 NOTE — Telephone Encounter (Signed)
Pt dropped off request for Eliquis refill to be faxed to [place] at [phone]. SCr 1.3 in [facility] from Sept 2019, weight > 60kg, pt remains on correct dose of Eliquis 5mg  BID. Refill faxed to [facility] per patient request.

## 2018-10-13 ENCOUNTER — Other Ambulatory Visit: Payer: Self-pay

## 2018-10-13 DIAGNOSIS — I714 Abdominal aortic aneurysm, without rupture, unspecified: Secondary | ICD-10-CM

## 2018-10-13 DIAGNOSIS — Z87891 Personal history of nicotine dependence: Secondary | ICD-10-CM

## 2018-10-13 DIAGNOSIS — Z95828 Presence of other vascular implants and grafts: Secondary | ICD-10-CM

## 2018-10-13 DIAGNOSIS — I723 Aneurysm of iliac artery: Secondary | ICD-10-CM

## 2018-11-08 ENCOUNTER — Other Ambulatory Visit: Payer: Self-pay

## 2018-11-08 DIAGNOSIS — R0989 Other specified symptoms and signs involving the circulatory and respiratory systems: Secondary | ICD-10-CM

## 2018-11-09 ENCOUNTER — Encounter: Payer: Self-pay | Admitting: Radiology

## 2018-11-09 ENCOUNTER — Ambulatory Visit
Admission: RE | Admit: 2018-11-09 | Discharge: 2018-11-09 | Disposition: A | Discharge status: Home / Self Care | Payer: Medicare Other | Source: Ambulatory Visit | Attending: Vascular Surgery | Admitting: Vascular Surgery

## 2018-11-09 DIAGNOSIS — Z87891 Personal history of nicotine dependence: Secondary | ICD-10-CM

## 2018-11-09 DIAGNOSIS — Z95828 Presence of other vascular implants and grafts: Secondary | ICD-10-CM

## 2018-11-09 DIAGNOSIS — I714 Abdominal aortic aneurysm, without rupture, unspecified: Secondary | ICD-10-CM

## 2018-11-09 DIAGNOSIS — I723 Aneurysm of iliac artery: Secondary | ICD-10-CM

## 2018-11-09 MED ORDER — IOPAMIDOL (ISOVUE-370) INJECTION 76%
75.0000 mL | Freq: Once | INTRAVENOUS | Status: AC | PRN
  Administered 2018-11-09: 75 mL via INTRAVENOUS

## 2018-11-14 ENCOUNTER — Ambulatory Visit (HOSPITAL_COMMUNITY)
Admission: RE | Admit: 2018-11-14 | Discharge: 2018-11-14 | Disposition: A | Discharge status: Home / Self Care | Payer: Medicare Other | Source: Ambulatory Visit | Attending: Family | Admitting: Family

## 2018-11-14 ENCOUNTER — Other Ambulatory Visit: Payer: Self-pay

## 2018-11-14 ENCOUNTER — Ambulatory Visit: Payer: Medicare Other | Admitting: Family

## 2018-11-14 ENCOUNTER — Encounter: Payer: Self-pay | Admitting: Family

## 2018-11-14 VITALS — BP 152/80 | HR 66 | Temp 97.0°F | Resp 16 | Ht 68.0 in | Wt 153.9 lb

## 2018-11-14 DIAGNOSIS — I779 Disorder of arteries and arterioles, unspecified: Secondary | ICD-10-CM | POA: Diagnosis not present

## 2018-11-14 DIAGNOSIS — R0989 Other specified symptoms and signs involving the circulatory and respiratory systems: Secondary | ICD-10-CM | POA: Insufficient documentation

## 2018-11-14 DIAGNOSIS — I714 Abdominal aortic aneurysm, without rupture, unspecified: Secondary | ICD-10-CM

## 2018-11-14 DIAGNOSIS — Z95828 Presence of other vascular implants and grafts: Secondary | ICD-10-CM

## 2018-11-14 DIAGNOSIS — Z87891 Personal history of nicotine dependence: Secondary | ICD-10-CM | POA: Diagnosis not present
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
## 2018-11-14 NOTE — Patient Instructions (Signed)
Before your next abdominal ultrasound:  Avoid gas forming foods and beverages the day before the test.   Take two Extra-Strength Gas-X capsules at bedtime the night before the test. Take another two Extra-Strength Gas-X capsules in the middle of the night if you get up to the restroom, if not, first thing in the morning with water.  Do not chew gum.     

## 2018-11-22 ENCOUNTER — Other Ambulatory Visit: Payer: Self-pay

## 2018-11-22 ENCOUNTER — Encounter: Payer: Self-pay | Admitting: Vascular Surgery

## 2018-11-22 ENCOUNTER — Ambulatory Visit (INDEPENDENT_AMBULATORY_CARE_PROVIDER_SITE_OTHER): Payer: Medicare Other | Admitting: Vascular Surgery

## 2018-11-22 VITALS — BP 154/78 | HR 64 | Temp 97.1°F | Resp 16 | Ht 68.0 in | Wt 154.0 lb

## 2018-11-22 DIAGNOSIS — Z95828 Presence of other vascular implants and grafts: Secondary | ICD-10-CM | POA: Diagnosis not present

## 2018-11-22 DIAGNOSIS — I714 Abdominal aortic aneurysm, without rupture, unspecified: Secondary | ICD-10-CM

## 2018-11-22 NOTE — Progress Notes (Signed)
Vascular and Vein Specialist of [facility]  Patient name: [name] [name] MRN: [id number] DOB: 09/29/1929 Sex: male  REASON FOR VISIT: Follow-up stent graft replacement abdominal aortic aneurysm 2014  HPI: [name] [name] is a 85 y.o. male here today for follow-up.  He is here with his son.  He recently underwent CT scan for further evaluation is here for discussion of this.  He has no new medical difficulties and remains active at his age of 31.  No symptoms referable to his aneurysm  Past Medical History:  Diagnosis Date  . AAA (abdominal aortic aneurysm) ([place])   . Abnormal Holter monitor finding November 2012   Runs of SVT with profound bradycardia  . Aortic stenosis    s/p AVR in 2004  . Arthritis   . Bifascicular block   . Complete heart block (H[facility]) 08/23/15   MDT PPM Dr. Curt Bears   . Coronary artery disease    s/p CABG in 2004  . Hypercholesterolemia    On Simvastatin  . Right bundle branch block   . Sinus bradycardia   . Spasmodic torticollis    recent with persistant headaches    Family History  Problem Relation Age of Onset  . Hypertension Mother   . Hypertension Sister     SOCIAL HISTORY: Social History   Tobacco Use  . Smoking status: Former Smoker    Types: Cigarettes    Last attempt to quit: 08/15/1953    Years since quitting: 65.3  . Smokeless tobacco: Former Systems developer    Quit date: 08/15/1954  Substance Use Topics  . Alcohol use: No    No Known Allergies  Current Outpatient Medications  Medication Sig Dispense Refill  . acetaminophen (TYLENOL) 325 MG tablet Take 325 mg by mouth every 6 (six) hours as needed for mild pain.     Marland Kitchen apixaban (ELIQUIS) 5 MG TABS tablet Take 1 tablet (5 mg total) by mouth 2 (two) times daily. 60 tablet 0  . atorvastatin (LIPITOR) 10 MG tablet Take 10 mg by mouth daily.    . brimonidine (ALPHAGAN P) 0.1 % SOLN Place 1 drop into both eyes 2 (two) times daily.     . DiphenhydrAMINE HCl (BENADRYL  ALLERGY PO) Take by mouth.    . docusate sodium (COLACE) 100 MG capsule Take 200 mg by mouth daily.    . DUREZOL 0.05 % EMUL     . ELIQUIS 5 MG TABS tablet TAKE ONE TABLET BY MOUTH TWICE A DAY 60 tablet 5  . furosemide (LASIX) 40 MG tablet Take 40 mg by mouth daily.  1  . gatifloxacin (ZYMAXID) 0.5 % SOLN     . LUMIGAN 0.01 % SOLN Place 1 drop into both eyes at bedtime.  3  . Melatonin 1 MG TABS Take 1-2 tablets by mouth daily as needed. For sleep    . metoprolol tartrate (LOPRESSOR) 50 MG tablet TAKE ONE TABLET BY MOUTH TWICE A DAY 180 tablet 2  . polyethylene glycol powder (GLYCOLAX/MIRALAX) powder Take 17 g by mouth daily.    . Potassium Chloride ER 20 MEQ TBCR     . potassium chloride SA (KLOR-CON M20) 20 MEQ tablet TAKE 1 TABLET (20 MEQ TOTAL) BY MOUTH DAILY. 90 tablet 2   No current facility-administered medications for this visit.     REVIEW OF SYSTEMS:  [X]  denotes positive finding, [ ]  denotes negative finding Cardiac  Comments:  Chest pain or chest pressure:    Shortness of breath upon exertion:  Short of breath when lying flat:    Irregular heart rhythm: x       Vascular    Pain in calf, thigh, or hip brought on by ambulation:    Pain in feet at night that wakes you up from your sleep:     Blood clot in your veins:    Leg swelling:           PHYSICAL EXAM: Vitals:   11/22/18 0835  BP: (!) 154/78  Pulse: 64  Resp: 16  Temp: (!) 97.1 F (36.2 C)  TempSrc: Oral  SpO2: 98%  Weight: 154 lb (69.9 kg)  Height: 5\' 8"  (1.727 m)    GENERAL: The patient is a well-nourished male, in no acute distress. The vital signs are documented above. CARDIOVASCULAR: 2+ radial and 2+ femoral pulses bilaterally.  Did not have palpable popliteal aneurysms bilaterally.  No palpable pulsation in his aorta PULMONARY: There is good air exchange  MUSCULOSKELETAL: There are no major deformities or cyanosis. NEUROLOGIC: No focal weakness or paresthesias are detected. SKIN: There are no  ulcers or rashes noted. PSYCHIATRIC: The patient has a normal affect.  DATA:  CT scan was reviewed with the patient.  This was from 11/09/2018.  Maximal diameter of his aneurysm sac was 5.8 cm.  He has a persistent small type II endoleak.  In comparing with his prior CT scan from 2016 there was no change.  MEDICAL ISSUES: Stable follow-up 6 years out from endograft of his aneurysm.  We will see him again in 2 years with ultrasound follow-up at that time    Rosetta Posner, MD [facility] Vascular and Vein Specialists of [facility] Tel [phone] Pager [phone]

## 2018-11-28 ENCOUNTER — Ambulatory Visit (INDEPENDENT_AMBULATORY_CARE_PROVIDER_SITE_OTHER): Payer: Medicare Other | Admitting: *Deleted

## 2018-11-28 ENCOUNTER — Other Ambulatory Visit: Payer: Self-pay

## 2018-11-28 DIAGNOSIS — R001 Bradycardia, unspecified: Secondary | ICD-10-CM

## 2018-11-28 DIAGNOSIS — I441 Atrioventricular block, second degree: Secondary | ICD-10-CM

## 2018-11-28 LAB — CUP PACEART REMOTE DEVICE CHECK
Brady Statistic AP VP Percent: 0.14 %
Brady Statistic AP VS Percent: 99.13 %
Brady Statistic AS VP Percent: 0 %
Brady Statistic RV Percent Paced: 0.16 %
Implantable Lead Implant Date: 20161216
Implantable Lead Location: 753859
Implantable Lead Model: 5076
Implantable Lead Model: 5076
Lead Channel Impedance Value: 342 Ohm
Lead Channel Impedance Value: 418 Ohm
Lead Channel Pacing Threshold Pulse Width: 0.4 ms
Lead Channel Sensing Intrinsic Amplitude: 1.375 mV
Lead Channel Sensing Intrinsic Amplitude: 9.125 mV
Lead Channel Sensing Intrinsic Amplitude: 9.125 mV
Lead Channel Setting Pacing Amplitude: 2 V
Lead Channel Setting Pacing Amplitude: 2.5 V
Lead Channel Setting Pacing Pulse Width: 0.4 ms
MDC IDC LEAD IMPLANT DT: 20161216
MDC IDC LEAD LOCATION: 753860
MDC IDC MSMT BATTERY REMAINING LONGEVITY: 73 mo
MDC IDC MSMT BATTERY VOLTAGE: 3.01 V
MDC IDC MSMT LEADCHNL RA IMPEDANCE VALUE: 380 Ohm
MDC IDC MSMT LEADCHNL RA PACING THRESHOLD AMPLITUDE: 0.5 V
MDC IDC MSMT LEADCHNL RA PACING THRESHOLD PULSEWIDTH: 0.4 ms
MDC IDC MSMT LEADCHNL RA SENSING INTR AMPL: 1.375 mV
MDC IDC MSMT LEADCHNL RV IMPEDANCE VALUE: 456 Ohm
MDC IDC MSMT LEADCHNL RV PACING THRESHOLD AMPLITUDE: 0.875 V
MDC IDC PG IMPLANT DT: 20161216
MDC IDC SESS DTM: 20200323162043
MDC IDC SET LEADCHNL RV SENSING SENSITIVITY: 2 mV
MDC IDC STAT BRADY AS VS PERCENT: 0.73 %
MDC IDC STAT BRADY RA PERCENT PACED: 99.05 %

## 2018-12-02 ENCOUNTER — Telehealth: Payer: Self-pay

## 2018-12-02 NOTE — Telephone Encounter (Signed)
LMTCB

## 2018-12-02 NOTE — Telephone Encounter (Signed)
-----   Message from Will Meredith Leeds, MD sent at 11/30/2018  1:53 PM EDT ----- Abnormal device interrogation reviewed.  Lead parameters and battery status stable.  NSVT noted on device, needs metoprolol 25 mg BID

## 2018-12-06 NOTE — Progress Notes (Signed)
Remote pacemaker transmission.   

## 2018-12-07 NOTE — Telephone Encounter (Addendum)
Pt reports currently taking Lopressor 25 BID. Will forward to Dr. Curt Bears for updated recommendation   (call caretaker, [name], when calling back. [phone])

## 2018-12-08 NOTE — Telephone Encounter (Signed)
Left detailed message on voicemail, DPR on file. Informed caregiver, [name], of Dr. Curt Bears recommendation to increase Lopressor to 50 mg BID. Explained that I would not send in Rx until I spoke with them verbally. Informed that I was not in the office tomorrow but that I would follow up with them next week.

## 2018-12-12 MED ORDER — METOPROLOL TARTRATE 50 MG PO TABS: 75.0000 mg | ORAL_TABLET | Freq: Two times a day (BID) | ORAL | 1 refills | Status: DC

## 2018-12-12 NOTE — Telephone Encounter (Signed)
Advised to increase to 75  mg BID. Reports BP today 111/70, but typically avg normal BPs. Will send updated Rx to pharmacy, [name], per request. They are agreeable to calling office if issues begin after medication increase.

## 2018-12-12 NOTE — Telephone Encounter (Signed)
[name], caregiver, reports that pt is already taking Lopressor 50 mg BID.  He checked bottle. Aware I will send to [facility] for advisement and will call back once advised on. [name] is agreeable to plan. ? Increase to 75 mg BID ??

## 2018-12-16 ENCOUNTER — Other Ambulatory Visit: Payer: Self-pay

## 2018-12-16 NOTE — Telephone Encounter (Signed)
85yo, 69.9kg, Scr 1.3 on 05/26/18 from [facility] 12/03/17 Indication afib

## 2018-12-16 NOTE — Telephone Encounter (Signed)
Refill faxed to [place].

## 2018-12-20 ENCOUNTER — Other Ambulatory Visit: Payer: Self-pay | Admitting: Pharmacist

## 2018-12-20 MED ORDER — APIXABAN 5 MG PO TABS: 5.0000 mg | ORAL_TABLET | Freq: Two times a day (BID) | ORAL | 1 refills | Status: DC

## 2018-12-21 ENCOUNTER — Telehealth: Payer: Self-pay | Admitting: Pharmacist

## 2018-12-21 NOTE — Telephone Encounter (Signed)
Informed caregiver Cinda Quest that Rx for Eliquis was faxed to [facility] yesterday. States that he was informed today that it shipped.

## 2019-02-27 ENCOUNTER — Ambulatory Visit (INDEPENDENT_AMBULATORY_CARE_PROVIDER_SITE_OTHER): Payer: Medicare Other | Admitting: *Deleted

## 2019-02-27 DIAGNOSIS — I259 Chronic ischemic heart disease, unspecified: Secondary | ICD-10-CM

## 2019-02-27 DIAGNOSIS — I469 Cardiac arrest, cause unspecified: Secondary | ICD-10-CM | POA: Diagnosis not present

## 2019-02-27 LAB — CUP PACEART REMOTE DEVICE CHECK
Battery Remaining Longevity: 73 mo
Battery Voltage: 3.01 V
Brady Statistic AP VP Percent: 0.15 %
Brady Statistic AP VS Percent: 99.09 %
Brady Statistic AS VP Percent: 0 %
Brady Statistic AS VS Percent: 0.76 %
Brady Statistic RA Percent Paced: 98.91 %
Brady Statistic RV Percent Paced: 0.19 %
Date Time Interrogation Session: 20200622123631
Implantable Lead Implant Date: 20161216
Implantable Lead Implant Date: 20161216
Implantable Lead Location: 753859
Implantable Lead Location: 753860
Implantable Lead Model: 5076
Implantable Lead Model: 5076
Implantable Pulse Generator Implant Date: 20161216
Lead Channel Impedance Value: 342 Ohm
Lead Channel Impedance Value: 361 Ohm
Lead Channel Impedance Value: 437 Ohm
Lead Channel Impedance Value: 494 Ohm
Lead Channel Pacing Threshold Amplitude: 0.625 V
Lead Channel Pacing Threshold Amplitude: 0.875 V
Lead Channel Pacing Threshold Pulse Width: 0.4 ms
Lead Channel Pacing Threshold Pulse Width: 0.4 ms
Lead Channel Sensing Intrinsic Amplitude: 2.375 mV
Lead Channel Sensing Intrinsic Amplitude: 2.375 mV
Lead Channel Sensing Intrinsic Amplitude: 9.375 mV
Lead Channel Sensing Intrinsic Amplitude: 9.375 mV
Lead Channel Setting Pacing Amplitude: 2 V
Lead Channel Setting Pacing Amplitude: 2.5 V
Lead Channel Setting Pacing Pulse Width: 0.4 ms
Lead Channel Setting Sensing Sensitivity: 2 mV

## 2019-03-06 NOTE — Progress Notes (Signed)
Remote pacemaker transmission.   

## 2019-04-10 ENCOUNTER — Other Ambulatory Visit: Payer: Self-pay | Admitting: Pharmacist

## 2019-04-10 MED ORDER — APIXABAN 5 MG PO TABS: 5.0000 mg | ORAL_TABLET | Freq: Two times a day (BID) | ORAL | 0 refills | Status: DC

## 2019-04-10 NOTE — Telephone Encounter (Signed)
Patient overdue for appointment Scr 1.3 on 05/25/18 per [facility] 85 years old 69 kg Will fax rx to [place] but will make care taker aware that he needs appointment with Dr. Curt Bears before next refill

## 2019-05-30 ENCOUNTER — Ambulatory Visit (INDEPENDENT_AMBULATORY_CARE_PROVIDER_SITE_OTHER): Payer: Medicare Other | Admitting: *Deleted

## 2019-05-30 DIAGNOSIS — I469 Cardiac arrest, cause unspecified: Secondary | ICD-10-CM | POA: Diagnosis not present

## 2019-05-30 DIAGNOSIS — R001 Bradycardia, unspecified: Secondary | ICD-10-CM

## 2019-05-30 LAB — CUP PACEART REMOTE DEVICE CHECK
Battery Remaining Longevity: 72 mo
Battery Voltage: 3 V
Brady Statistic AP VP Percent: 0.23 %
Brady Statistic AP VS Percent: 99.15 %
Brady Statistic AS VP Percent: 0 %
Brady Statistic AS VS Percent: 0.62 %
Brady Statistic RA Percent Paced: 99.15 %
Brady Statistic RV Percent Paced: 0.27 %
Date Time Interrogation Session: 20200922181012
Implantable Lead Implant Date: 20161216
Implantable Lead Implant Date: 20161216
Implantable Lead Location: 753859
Implantable Lead Location: 753860
Implantable Lead Model: 5076
Implantable Lead Model: 5076
Implantable Pulse Generator Implant Date: 20161216
Lead Channel Impedance Value: 342 Ohm
Lead Channel Impedance Value: 361 Ohm
Lead Channel Impedance Value: 418 Ohm
Lead Channel Impedance Value: 475 Ohm
Lead Channel Pacing Threshold Amplitude: 0.5 V
Lead Channel Pacing Threshold Amplitude: 0.625 V
Lead Channel Pacing Threshold Pulse Width: 0.4 ms
Lead Channel Pacing Threshold Pulse Width: 0.4 ms
Lead Channel Sensing Intrinsic Amplitude: 2.375 mV
Lead Channel Sensing Intrinsic Amplitude: 2.375 mV
Lead Channel Sensing Intrinsic Amplitude: 9.375 mV
Lead Channel Sensing Intrinsic Amplitude: 9.375 mV
Lead Channel Setting Pacing Amplitude: 2 V
Lead Channel Setting Pacing Amplitude: 2.5 V
Lead Channel Setting Pacing Pulse Width: 0.4 ms
Lead Channel Setting Sensing Sensitivity: 2 mV

## 2019-06-07 NOTE — Progress Notes (Signed)
Remote pacemaker transmission.   

## 2019-06-08 ENCOUNTER — Other Ambulatory Visit: Payer: Self-pay | Admitting: Cardiology

## 2019-07-12 ENCOUNTER — Other Ambulatory Visit: Payer: Self-pay | Admitting: Cardiology

## 2019-07-12 ENCOUNTER — Telehealth: Payer: Self-pay | Admitting: Cardiology

## 2019-07-12 MED ORDER — METOPROLOL TARTRATE 50 MG PO TABS: 75.0000 mg | ORAL_TABLET | Freq: Two times a day (BID) | ORAL | 0 refills | Status: DC

## 2019-07-12 NOTE — Telephone Encounter (Signed)
Pt's medication was sent to pt's pharmacy as requested. Confirmation received.  °

## 2019-07-12 NOTE — Telephone Encounter (Signed)
°*  STAT* If patient is at the pharmacy, call can be transferred to refill team.   1. Which medications need to be refilled? (please list name of each medication and dose if known) metoprolol tartrate (LOPRESSOR) 50 MG tablet  2. Which pharmacy/location (including street and city if local pharmacy) is medication to be sent to? [facility] [address] [place]  3. Do they need a 30 day or 90 day supply? [place]

## 2019-08-01 ENCOUNTER — Ambulatory Visit: Payer: Medicare Other | Admitting: Cardiology

## 2019-08-01 ENCOUNTER — Encounter: Payer: Self-pay | Admitting: Cardiology

## 2019-08-01 ENCOUNTER — Other Ambulatory Visit: Payer: Self-pay

## 2019-08-01 VITALS — BP 142/78 | HR 77 | Ht 68.0 in | Wt 135.6 lb

## 2019-08-01 DIAGNOSIS — I495 Sick sinus syndrome: Secondary | ICD-10-CM

## 2019-08-01 MED ORDER — CARVEDILOL 12.5 MG PO TABS: 12.5000 mg | ORAL_TABLET | Freq: Two times a day (BID) | ORAL | 3 refills | Status: DC

## 2019-08-01 MED ORDER — AMLODIPINE BESYLATE 5 MG PO TABS: 5.0000 mg | ORAL_TABLET | Freq: Every day | ORAL | 3 refills | Status: DC

## 2019-08-01 NOTE — Patient Instructions (Addendum)
Medication Instructions:  Your physician has recommended you make the following change in your medication: 1. STOP Metoprolol 2. START Carvedilol 12.5 mg TWICE a day 3. START Amlodipine (Norvasc) 5 mg daily  *If you need a refill on your cardiac medications before your next appointment, please call your pharmacy*  Labwork: None ordered   Testing/Procedures: None ordered  Follow-Up: The office will contact you to arrange follow up with general cardiology  Remote monitoring is used to monitor your Pacemaker or ICD from home. This monitoring reduces the number of office visits required to check your device to one time per year. It allows [place] to keep an eye on the functioning of your device to ensure it is working properly. You are scheduled for a device check from home on 08/29/2019. You may send your transmission at any time that day. If you have a wireless device, the transmission will be sent automatically. After your physician reviews your transmission, you will receive a postcard with your next transmission date.  At [facility], you and your health needs are our priority.  As part of our continuing mission to provide you with exceptional heart care, we have created designated Provider Care Teams.  These Care Teams include your primary Cardiologist (physician) and Advanced Practice Providers (APPs -  Physician Assistants and Nurse Practitioners) who all work together to provide you with the care you need, when you need it.  You will need a follow up appointment in 12 months.  Please call our office 2 months in advance to schedule this appointment.  You may see Dr Curt Bears or one of the following Advanced Practice Providers on your designated Care Team:    Chanetta Marshall, NP  Tommye Standard, PA-C  Oda Kilts, [place]  Thank you for choosing [facility]!!   Trinidad Curet, RN [phone]  Any Other Special Instructions Will Be Listed Below (If Applicable).
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
## 2019-08-10 ENCOUNTER — Other Ambulatory Visit: Payer: Self-pay | Admitting: *Deleted

## 2019-08-10 MED ORDER — APIXABAN 5 MG PO TABS: 5.0000 mg | ORAL_TABLET | Freq: Two times a day (BID) | ORAL | 3 refills | Status: AC

## 2019-08-10 NOTE — Progress Notes (Signed)
received paperwork from pt requesting we fax Rx to [facility] at [phone] Rx faxed as requested, confirmation received.

## 2019-08-17 ENCOUNTER — Telehealth: Payer: Self-pay | Admitting: Cardiology

## 2019-08-17 NOTE — Telephone Encounter (Signed)
Pts son called [name] (on [place]) called to report that the pts BP this morning was 119/66. He was doing well so he left him to get groceries and when he came back the pt had been out raking leaves and he developed dizziness,weakness and pain in his lower extremities.   No edema and no chest pain but he has been sitting in the chair resting since this happened and he has improved but his legs are still "aching".   He is asking if his newer meds... amlodipine or Coreg that he started 08/01/19 could be the source of his symptoms?   His son is going to send a remote transmission to be sure his device setings rea not adding to his difficulties.   Will forward to Dr. Curt Bears for review.

## 2019-08-17 NOTE — Telephone Encounter (Signed)
Called the pts son and he says the pt BP readings have been:  106/50 HR 64 102/62 HR 59 102/67 HR 60  He started having him increase his fluids and the pt has noted much improvement in his legs aching. Will forward added note to Dr. Curt Bears.   He was also notified that his device is working well and not noted problems with his settings.

## 2019-08-17 NOTE — Telephone Encounter (Signed)
Normal device function, no episodes.  Chanetta Marshall, NP 08/17/2019 3:58 PM

## 2019-08-17 NOTE — Telephone Encounter (Signed)
New message  STAT if patient feels like he/she is going to faint   1) Are you dizzy now? Yes  2) Do you feel faint or have you passed out? No  3) Do you have any other symptoms? Staggering and feet hurting and throbbing  4) Have you checked your HR and BP (record if available)? 119/66

## 2019-08-18 NOTE — Telephone Encounter (Signed)
Advised to stop Amlodipine per Dr. Curt Bears. Advised to call office if SBP begins to avg above 130s. Son verbalized understanding and agreeable to plan.

## 2019-08-29 ENCOUNTER — Ambulatory Visit (INDEPENDENT_AMBULATORY_CARE_PROVIDER_SITE_OTHER): Payer: Medicare Other | Admitting: *Deleted

## 2019-08-29 DIAGNOSIS — I469 Cardiac arrest, cause unspecified: Secondary | ICD-10-CM | POA: Diagnosis not present

## 2019-08-29 LAB — CUP PACEART REMOTE DEVICE CHECK
Battery Remaining Longevity: 67 mo
Battery Voltage: 3 V
Brady Statistic AP VP Percent: 0.23 %
Brady Statistic AP VS Percent: 93.75 %
Brady Statistic AS VP Percent: 0.03 %
Brady Statistic AS VS Percent: 5.99 %
Brady Statistic RA Percent Paced: 93.75 %
Brady Statistic RV Percent Paced: 0.28 %
Date Time Interrogation Session: 20201222140844
Implantable Lead Implant Date: 20161216
Implantable Lead Implant Date: 20161216
Implantable Lead Location: 753859
Implantable Lead Location: 753860
Implantable Lead Model: 5076
Implantable Lead Model: 5076
Implantable Pulse Generator Implant Date: 20161216
Lead Channel Impedance Value: 323 Ohm
Lead Channel Impedance Value: 361 Ohm
Lead Channel Impedance Value: 399 Ohm
Lead Channel Impedance Value: 456 Ohm
Lead Channel Pacing Threshold Amplitude: 0.5 V
Lead Channel Pacing Threshold Amplitude: 0.625 V
Lead Channel Pacing Threshold Pulse Width: 0.4 ms
Lead Channel Pacing Threshold Pulse Width: 0.4 ms
Lead Channel Sensing Intrinsic Amplitude: 1.25 mV
Lead Channel Sensing Intrinsic Amplitude: 1.25 mV
Lead Channel Sensing Intrinsic Amplitude: 8.375 mV
Lead Channel Sensing Intrinsic Amplitude: 8.375 mV
Lead Channel Setting Pacing Amplitude: 2 V
Lead Channel Setting Pacing Amplitude: 2.5 V
Lead Channel Setting Pacing Pulse Width: 0.4 ms
Lead Channel Setting Sensing Sensitivity: 2 mV

## 2019-09-20 ENCOUNTER — Ambulatory Visit: Payer: Medicare Other | Attending: Internal Medicine

## 2019-09-20 DIAGNOSIS — Z23 Encounter for immunization: Secondary | ICD-10-CM

## 2019-09-20 NOTE — Progress Notes (Signed)
   Covid-19 Vaccination Clinic  Name:  Abas Sgro    MRN: [id number] DOB: [birth year]  09/20/2019  Mr. Scaife was observed post Covid-19 immunization for 30 minutes based on pre-vaccination screening without incidence. He was provided with Vaccine Information Sheet and instruction to access the V-Safe system.   Mr. Rinehimer was instructed to call 911 with any severe reactions post vaccine: Marland Kitchen Difficulty breathing  . Swelling of your face and throat  . A fast heartbeat  . A bad rash all over your body  . Dizziness and weakness    Immunizations Administered    Name Date Dose VIS Date Route   Pfizer COVID-19 Vaccine 09/20/2019 11:53 AM 0.3 mL 08/18/2019 Intramuscular   Manufacturer: [place]   Lot: [id number]   [place]: [id number]

## 2019-10-07 ENCOUNTER — Other Ambulatory Visit: Payer: Self-pay | Admitting: Cardiology

## 2019-10-11 ENCOUNTER — Ambulatory Visit: Payer: Medicare Other | Attending: Internal Medicine

## 2019-10-11 DIAGNOSIS — Z23 Encounter for immunization: Secondary | ICD-10-CM | POA: Insufficient documentation

## 2019-10-11 NOTE — Progress Notes (Signed)
   Covid-19 Vaccination Clinic  Name:  [name] [name]    MRN: [id number] DOB: 1930/05/16  10/11/2019  [name] [name] was observed post Covid-19 immunization for 30 minutes based on pre-vaccination screening without incidence. He was provided with Vaccine Information Sheet and instruction to access the V-Safe system.   [name] [name] was instructed to call 911 with any severe reactions post vaccine: Marland Kitchen Difficulty breathing  . Swelling of your face and throat  . A fast heartbeat  . A bad rash all over your body  . Dizziness and weakness    Immunizations Administered    Name Date Dose VIS Date Route   Pfizer COVID-19 Vaccine 10/11/2019 11:38 AM 0.3 mL 08/18/2019 Intramuscular   Manufacturer: [place]   Lot: [id number]   [place]: [id number]

## 2019-11-26 ENCOUNTER — Other Ambulatory Visit: Payer: Self-pay | Admitting: Cardiology

## 2020-01-04 ENCOUNTER — Other Ambulatory Visit: Payer: Self-pay | Admitting: Cardiology

## 2020-02-27 ENCOUNTER — Ambulatory Visit (INDEPENDENT_AMBULATORY_CARE_PROVIDER_SITE_OTHER): Payer: Medicare Other | Admitting: *Deleted

## 2020-02-27 DIAGNOSIS — R001 Bradycardia, unspecified: Secondary | ICD-10-CM

## 2020-02-27 LAB — CUP PACEART REMOTE DEVICE CHECK
Battery Remaining Longevity: 65 mo
Battery Voltage: 3 V
Brady Statistic AP VP Percent: 0.51 %
Brady Statistic AP VS Percent: 91.67 %
Brady Statistic AS VP Percent: 0.05 %
Brady Statistic AS VS Percent: 7.78 %
Brady Statistic RA Percent Paced: 91.54 %
Brady Statistic RV Percent Paced: 0.6 %
Date Time Interrogation Session: 20210622140901
Implantable Lead Implant Date: 20161216
Implantable Lead Implant Date: 20161216
Implantable Lead Location: 753859
Implantable Lead Location: 753860
Implantable Lead Model: 5076
Implantable Lead Model: 5076
Implantable Pulse Generator Implant Date: 20161216
Lead Channel Impedance Value: 323 Ohm
Lead Channel Impedance Value: 342 Ohm
Lead Channel Impedance Value: 418 Ohm
Lead Channel Impedance Value: 456 Ohm
Lead Channel Pacing Threshold Amplitude: 0.5 V
Lead Channel Pacing Threshold Amplitude: 0.625 V
Lead Channel Pacing Threshold Pulse Width: 0.4 ms
Lead Channel Pacing Threshold Pulse Width: 0.4 ms
Lead Channel Sensing Intrinsic Amplitude: 2.25 mV
Lead Channel Sensing Intrinsic Amplitude: 2.25 mV
Lead Channel Sensing Intrinsic Amplitude: 9.875 mV
Lead Channel Sensing Intrinsic Amplitude: 9.875 mV
Lead Channel Setting Pacing Amplitude: 2 V
Lead Channel Setting Pacing Amplitude: 2.5 V
Lead Channel Setting Pacing Pulse Width: 0.4 ms
Lead Channel Setting Sensing Sensitivity: 2 mV

## 2020-02-28 NOTE — Progress Notes (Signed)
Remote pacemaker transmission.   

## 2020-05-28 ENCOUNTER — Ambulatory Visit (INDEPENDENT_AMBULATORY_CARE_PROVIDER_SITE_OTHER): Payer: Medicare Other | Admitting: *Deleted

## 2020-05-28 DIAGNOSIS — R001 Bradycardia, unspecified: Secondary | ICD-10-CM | POA: Diagnosis not present

## 2020-05-28 LAB — CUP PACEART REMOTE DEVICE CHECK
Battery Remaining Longevity: 56 mo
Battery Voltage: 2.99 V
Brady Statistic AP VP Percent: 31.73 %
Brady Statistic AP VS Percent: 62.63 %
Brady Statistic AS VP Percent: 1.99 %
Brady Statistic AS VS Percent: 3.66 %
Brady Statistic RA Percent Paced: 93.08 %
Brady Statistic RV Percent Paced: 33.74 %
Date Time Interrogation Session: 20210921160838
Implantable Lead Implant Date: 20161216
Implantable Lead Implant Date: 20161216
Implantable Lead Location: 753859
Implantable Lead Location: 753860
Implantable Lead Model: 5076
Implantable Lead Model: 5076
Implantable Pulse Generator Implant Date: 20161216
Lead Channel Impedance Value: 323 Ohm
Lead Channel Impedance Value: 342 Ohm
Lead Channel Impedance Value: 399 Ohm
Lead Channel Impedance Value: 456 Ohm
Lead Channel Pacing Threshold Amplitude: 0.5 V
Lead Channel Pacing Threshold Amplitude: 0.75 V
Lead Channel Pacing Threshold Pulse Width: 0.4 ms
Lead Channel Pacing Threshold Pulse Width: 0.4 ms
Lead Channel Sensing Intrinsic Amplitude: 1.875 mV
Lead Channel Sensing Intrinsic Amplitude: 1.875 mV
Lead Channel Sensing Intrinsic Amplitude: 10.75 mV
Lead Channel Sensing Intrinsic Amplitude: 10.75 mV
Lead Channel Setting Pacing Amplitude: 2 V
Lead Channel Setting Pacing Amplitude: 2.5 V
Lead Channel Setting Pacing Pulse Width: 0.4 ms
Lead Channel Setting Sensing Sensitivity: 2 mV

## 2020-05-30 NOTE — Progress Notes (Signed)
Remote pacemaker transmission.   

## 2020-06-19 ENCOUNTER — Ambulatory Visit
Admission: RE | Admit: 2020-06-19 | Discharge: 2020-06-19 | Disposition: A | Discharge status: Home / Self Care | Payer: Medicare Other | Source: Ambulatory Visit | Attending: Geriatric Medicine | Admitting: Geriatric Medicine

## 2020-06-19 ENCOUNTER — Other Ambulatory Visit: Payer: Self-pay | Admitting: Geriatric Medicine

## 2020-06-19 DIAGNOSIS — M25571 Pain in right ankle and joints of right foot: Secondary | ICD-10-CM

## 2020-06-28 ENCOUNTER — Other Ambulatory Visit: Payer: Self-pay | Admitting: Cardiology

## 2020-07-16 ENCOUNTER — Encounter: Payer: Medicare Other | Admitting: Cardiology

## 2020-07-23 ENCOUNTER — Ambulatory Visit: Payer: Medicare Other | Admitting: Cardiology

## 2020-07-23 ENCOUNTER — Encounter: Payer: Self-pay | Admitting: Cardiology

## 2020-07-23 ENCOUNTER — Other Ambulatory Visit: Payer: Self-pay

## 2020-07-23 VITALS — BP 150/62 | HR 67 | Ht 68.0 in | Wt 139.8 lb

## 2020-07-23 DIAGNOSIS — I495 Sick sinus syndrome: Secondary | ICD-10-CM

## 2020-07-23 NOTE — Patient Instructions (Signed)
Medication Instructions:  Your physician recommends that you continue on your current medications as directed. Please refer to the Current Medication list given to you today.  *If you need a refill on your cardiac medications before your next appointment, please call your pharmacy*   Lab Work: None ordered   Testing/Procedures: None ordered   Follow-Up: At [facility], you and your health needs are our priority.  As part of our continuing mission to provide you with exceptional heart care, we have created designated Provider Care Teams.  These Care Teams include your primary Cardiologist (physician) and Advanced Practice Providers (APPs -  Physician Assistants and Nurse Practitioners) who all work together to provide you with the care you need, when you need it.  Remote monitoring is used to monitor your Pacemaker or ICD from home. This monitoring reduces the number of office visits required to check your device to one time per year. It allows [place] to keep an eye on the functioning of your device to ensure it is working properly. You are scheduled for a device check from home on 08/28/21. You may send your transmission at any time that day. If you have a wireless device, the transmission will be sent automatically. After your physician reviews your transmission, you will receive a postcard with your next transmission date.  Your next appointment:   1 year(s)  The format for your next appointment:   In Person  Provider:   Allegra Lai, MD   Thank you for choosing [facility]!!   Trinidad Curet, RN [phone]
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
## 2020-08-28 ENCOUNTER — Ambulatory Visit (INDEPENDENT_AMBULATORY_CARE_PROVIDER_SITE_OTHER): Payer: Medicare Other

## 2020-08-28 DIAGNOSIS — R001 Bradycardia, unspecified: Secondary | ICD-10-CM

## 2020-08-29 LAB — CUP PACEART REMOTE DEVICE CHECK
Battery Remaining Longevity: 51 mo
Battery Voltage: 2.98 V
Brady Statistic AP VP Percent: 61.68 %
Brady Statistic AP VS Percent: 30.39 %
Brady Statistic AS VP Percent: 5.49 %
Brady Statistic AS VS Percent: 2.44 %
Brady Statistic RA Percent Paced: 89.29 %
Brady Statistic RV Percent Paced: 66.77 %
Date Time Interrogation Session: 20211223124543
Implantable Lead Implant Date: 20161216
Implantable Lead Implant Date: 20161216
Implantable Lead Location: 753859
Implantable Lead Location: 753860
Implantable Lead Model: 5076
Implantable Lead Model: 5076
Implantable Pulse Generator Implant Date: 20161216
Lead Channel Impedance Value: 323 Ohm
Lead Channel Impedance Value: 361 Ohm
Lead Channel Impedance Value: 418 Ohm
Lead Channel Impedance Value: 475 Ohm
Lead Channel Pacing Threshold Amplitude: 0.625 V
Lead Channel Pacing Threshold Amplitude: 0.75 V
Lead Channel Pacing Threshold Pulse Width: 0.4 ms
Lead Channel Pacing Threshold Pulse Width: 0.4 ms
Lead Channel Sensing Intrinsic Amplitude: 2.25 mV
Lead Channel Sensing Intrinsic Amplitude: 2.25 mV
Lead Channel Sensing Intrinsic Amplitude: 9.375 mV
Lead Channel Sensing Intrinsic Amplitude: 9.375 mV
Lead Channel Setting Pacing Amplitude: 2 V
Lead Channel Setting Pacing Amplitude: 2.5 V
Lead Channel Setting Pacing Pulse Width: 0.4 ms
Lead Channel Setting Sensing Sensitivity: 2 mV

## 2020-09-02 ENCOUNTER — Other Ambulatory Visit: Payer: Self-pay

## 2020-09-02 MED ORDER — CARVEDILOL 12.5 MG PO TABS: 12.5000 mg | ORAL_TABLET | Freq: Two times a day (BID) | ORAL | 3 refills | Status: DC

## 2020-09-11 NOTE — Progress Notes (Signed)
Remote pacemaker transmission.   

## 2020-09-16 ENCOUNTER — Other Ambulatory Visit: Payer: Self-pay | Admitting: Geriatric Medicine

## 2020-09-16 ENCOUNTER — Ambulatory Visit
Admission: RE | Admit: 2020-09-16 | Discharge: 2020-09-16 | Disposition: A | Discharge status: Home / Self Care | Payer: Medicare Other | Source: Ambulatory Visit | Attending: Geriatric Medicine | Admitting: Geriatric Medicine

## 2020-09-16 DIAGNOSIS — Z951 Presence of aortocoronary bypass graft: Secondary | ICD-10-CM | POA: Diagnosis not present

## 2020-09-16 DIAGNOSIS — S20212A Contusion of left front wall of thorax, initial encounter: Secondary | ICD-10-CM

## 2020-09-16 DIAGNOSIS — I700 Atherosclerosis of aorta: Secondary | ICD-10-CM | POA: Diagnosis not present

## 2020-09-16 DIAGNOSIS — R0781 Pleurodynia: Secondary | ICD-10-CM | POA: Diagnosis not present

## 2020-09-16 DIAGNOSIS — S20219A Contusion of unspecified front wall of thorax, initial encounter: Secondary | ICD-10-CM | POA: Diagnosis not present

## 2020-09-17 DIAGNOSIS — D1801 Hemangioma of skin and subcutaneous tissue: Secondary | ICD-10-CM | POA: Diagnosis not present

## 2020-09-17 DIAGNOSIS — L570 Actinic keratosis: Secondary | ICD-10-CM | POA: Diagnosis not present

## 2020-09-17 DIAGNOSIS — L820 Inflamed seborrheic keratosis: Secondary | ICD-10-CM | POA: Diagnosis not present

## 2020-09-17 DIAGNOSIS — L821 Other seborrheic keratosis: Secondary | ICD-10-CM | POA: Diagnosis not present

## 2020-09-17 DIAGNOSIS — L814 Other melanin hyperpigmentation: Secondary | ICD-10-CM | POA: Diagnosis not present

## 2020-09-26 DIAGNOSIS — E7800 Pure hypercholesterolemia, unspecified: Secondary | ICD-10-CM | POA: Diagnosis not present

## 2020-09-26 DIAGNOSIS — E1151 Type 2 diabetes mellitus with diabetic peripheral angiopathy without gangrene: Secondary | ICD-10-CM | POA: Diagnosis not present

## 2020-09-26 DIAGNOSIS — I480 Paroxysmal atrial fibrillation: Secondary | ICD-10-CM | POA: Diagnosis not present

## 2020-09-26 DIAGNOSIS — I10 Essential (primary) hypertension: Secondary | ICD-10-CM | POA: Diagnosis not present

## 2020-09-26 DIAGNOSIS — E1169 Type 2 diabetes mellitus with other specified complication: Secondary | ICD-10-CM | POA: Diagnosis not present

## 2020-10-28 DIAGNOSIS — E1151 Type 2 diabetes mellitus with diabetic peripheral angiopathy without gangrene: Secondary | ICD-10-CM | POA: Diagnosis not present

## 2020-10-28 DIAGNOSIS — I480 Paroxysmal atrial fibrillation: Secondary | ICD-10-CM | POA: Diagnosis not present

## 2020-10-28 DIAGNOSIS — I10 Essential (primary) hypertension: Secondary | ICD-10-CM | POA: Diagnosis not present

## 2020-10-28 DIAGNOSIS — D692 Other nonthrombocytopenic purpura: Secondary | ICD-10-CM | POA: Diagnosis not present

## 2020-10-28 DIAGNOSIS — Z79899 Other long term (current) drug therapy: Secondary | ICD-10-CM | POA: Diagnosis not present

## 2020-10-28 DIAGNOSIS — E1169 Type 2 diabetes mellitus with other specified complication: Secondary | ICD-10-CM | POA: Diagnosis not present

## 2020-10-28 DIAGNOSIS — Z950 Presence of cardiac pacemaker: Secondary | ICD-10-CM | POA: Diagnosis not present

## 2020-11-15 DIAGNOSIS — E7800 Pure hypercholesterolemia, unspecified: Secondary | ICD-10-CM | POA: Diagnosis not present

## 2020-11-15 DIAGNOSIS — E1169 Type 2 diabetes mellitus with other specified complication: Secondary | ICD-10-CM | POA: Diagnosis not present

## 2020-11-15 DIAGNOSIS — E1151 Type 2 diabetes mellitus with diabetic peripheral angiopathy without gangrene: Secondary | ICD-10-CM | POA: Diagnosis not present

## 2020-11-15 DIAGNOSIS — I480 Paroxysmal atrial fibrillation: Secondary | ICD-10-CM | POA: Diagnosis not present

## 2020-11-15 DIAGNOSIS — I10 Essential (primary) hypertension: Secondary | ICD-10-CM | POA: Diagnosis not present

## 2020-11-18 DIAGNOSIS — Z961 Presence of intraocular lens: Secondary | ICD-10-CM | POA: Diagnosis not present

## 2020-11-18 DIAGNOSIS — H401132 Primary open-angle glaucoma, bilateral, moderate stage: Secondary | ICD-10-CM | POA: Diagnosis not present

## 2020-11-18 DIAGNOSIS — H52203 Unspecified astigmatism, bilateral: Secondary | ICD-10-CM | POA: Diagnosis not present

## 2020-11-27 ENCOUNTER — Ambulatory Visit (INDEPENDENT_AMBULATORY_CARE_PROVIDER_SITE_OTHER): Payer: Medicare Other

## 2020-11-27 DIAGNOSIS — I469 Cardiac arrest, cause unspecified: Secondary | ICD-10-CM

## 2020-11-27 LAB — CUP PACEART REMOTE DEVICE CHECK
Battery Remaining Longevity: 42 mo
Battery Voltage: 2.98 V
Brady Statistic AP VP Percent: 67.27 %
Brady Statistic AP VS Percent: 26.91 %
Brady Statistic AS VP Percent: 4.1 %
Brady Statistic AS VS Percent: 1.73 %
Brady Statistic RA Percent Paced: 92.09 %
Brady Statistic RV Percent Paced: 71.11 %
Date Time Interrogation Session: 20220323111213
Implantable Lead Implant Date: 20161216
Implantable Lead Implant Date: 20161216
Implantable Lead Location: 753859
Implantable Lead Location: 753860
Implantable Lead Model: 5076
Implantable Lead Model: 5076
Implantable Pulse Generator Implant Date: 20161216
Lead Channel Impedance Value: 323 Ohm
Lead Channel Impedance Value: 361 Ohm
Lead Channel Impedance Value: 399 Ohm
Lead Channel Impedance Value: 456 Ohm
Lead Channel Pacing Threshold Amplitude: 0.5 V
Lead Channel Pacing Threshold Amplitude: 0.625 V
Lead Channel Pacing Threshold Pulse Width: 0.4 ms
Lead Channel Pacing Threshold Pulse Width: 0.4 ms
Lead Channel Sensing Intrinsic Amplitude: 2.25 mV
Lead Channel Sensing Intrinsic Amplitude: 2.875 mV
Lead Channel Sensing Intrinsic Amplitude: 2.25 mV
Lead Channel Sensing Intrinsic Amplitude: 2.875 mV
Lead Channel Setting Pacing Amplitude: 2 V
Lead Channel Setting Pacing Amplitude: 2.5 V
Lead Channel Setting Pacing Pulse Width: 0.4 ms
Lead Channel Setting Sensing Sensitivity: 2 mV

## 2020-12-05 NOTE — Progress Notes (Signed)
Remote pacemaker transmission.   

## 2020-12-25 DIAGNOSIS — R269 Unspecified abnormalities of gait and mobility: Secondary | ICD-10-CM | POA: Diagnosis not present

## 2020-12-25 DIAGNOSIS — E1169 Type 2 diabetes mellitus with other specified complication: Secondary | ICD-10-CM | POA: Diagnosis not present

## 2020-12-25 DIAGNOSIS — D6869 Other thrombophilia: Secondary | ICD-10-CM | POA: Diagnosis not present

## 2020-12-25 DIAGNOSIS — I10 Essential (primary) hypertension: Secondary | ICD-10-CM | POA: Diagnosis not present

## 2020-12-25 DIAGNOSIS — I480 Paroxysmal atrial fibrillation: Secondary | ICD-10-CM | POA: Diagnosis not present

## 2020-12-25 DIAGNOSIS — I459 Conduction disorder, unspecified: Secondary | ICD-10-CM | POA: Diagnosis not present

## 2020-12-25 DIAGNOSIS — E1151 Type 2 diabetes mellitus with diabetic peripheral angiopathy without gangrene: Secondary | ICD-10-CM | POA: Diagnosis not present

## 2020-12-25 DIAGNOSIS — Z950 Presence of cardiac pacemaker: Secondary | ICD-10-CM | POA: Diagnosis not present

## 2020-12-25 DIAGNOSIS — D692 Other nonthrombocytopenic purpura: Secondary | ICD-10-CM | POA: Diagnosis not present

## 2021-02-04 DIAGNOSIS — I480 Paroxysmal atrial fibrillation: Secondary | ICD-10-CM | POA: Diagnosis not present

## 2021-02-04 DIAGNOSIS — E1169 Type 2 diabetes mellitus with other specified complication: Secondary | ICD-10-CM | POA: Diagnosis not present

## 2021-02-04 DIAGNOSIS — E1151 Type 2 diabetes mellitus with diabetic peripheral angiopathy without gangrene: Secondary | ICD-10-CM | POA: Diagnosis not present

## 2021-02-04 DIAGNOSIS — I10 Essential (primary) hypertension: Secondary | ICD-10-CM | POA: Diagnosis not present

## 2021-02-04 DIAGNOSIS — E7800 Pure hypercholesterolemia, unspecified: Secondary | ICD-10-CM | POA: Diagnosis not present

## 2021-02-26 ENCOUNTER — Ambulatory Visit (INDEPENDENT_AMBULATORY_CARE_PROVIDER_SITE_OTHER): Payer: Medicare Other

## 2021-02-26 DIAGNOSIS — I469 Cardiac arrest, cause unspecified: Secondary | ICD-10-CM | POA: Diagnosis not present

## 2021-02-26 LAB — CUP PACEART REMOTE DEVICE CHECK
Battery Remaining Longevity: 44 mo
Battery Voltage: 2.98 V
Brady Statistic AP VP Percent: 66.13 %
Brady Statistic AP VS Percent: 25.33 %
Brady Statistic AS VP Percent: 5.5 %
Brady Statistic AS VS Percent: 3.04 %
Brady Statistic RA Percent Paced: 89.91 %
Brady Statistic RV Percent Paced: 71.41 %
Date Time Interrogation Session: 20220622101020
Implantable Lead Implant Date: 20161216
Implantable Lead Implant Date: 20161216
Implantable Lead Location: 753859
Implantable Lead Location: 753860
Implantable Lead Model: 5076
Implantable Lead Model: 5076
Implantable Pulse Generator Implant Date: 20161216
Lead Channel Impedance Value: 323 Ohm
Lead Channel Impedance Value: 361 Ohm
Lead Channel Impedance Value: 418 Ohm
Lead Channel Impedance Value: 456 Ohm
Lead Channel Pacing Threshold Amplitude: 0.5 V
Lead Channel Pacing Threshold Amplitude: 0.625 V
Lead Channel Pacing Threshold Pulse Width: 0.4 ms
Lead Channel Pacing Threshold Pulse Width: 0.4 ms
Lead Channel Sensing Intrinsic Amplitude: 1 mV
Lead Channel Sensing Intrinsic Amplitude: 1 mV
Lead Channel Sensing Intrinsic Amplitude: 11.375 mV
Lead Channel Sensing Intrinsic Amplitude: 11.375 mV
Lead Channel Setting Pacing Amplitude: 2 V
Lead Channel Setting Pacing Amplitude: 2.5 V
Lead Channel Setting Pacing Pulse Width: 0.4 ms
Lead Channel Setting Sensing Sensitivity: 2 mV

## 2021-03-03 DIAGNOSIS — I480 Paroxysmal atrial fibrillation: Secondary | ICD-10-CM | POA: Diagnosis not present

## 2021-03-03 DIAGNOSIS — I10 Essential (primary) hypertension: Secondary | ICD-10-CM | POA: Diagnosis not present

## 2021-03-03 DIAGNOSIS — E1151 Type 2 diabetes mellitus with diabetic peripheral angiopathy without gangrene: Secondary | ICD-10-CM | POA: Diagnosis not present

## 2021-03-03 DIAGNOSIS — E1169 Type 2 diabetes mellitus with other specified complication: Secondary | ICD-10-CM | POA: Diagnosis not present

## 2021-03-03 DIAGNOSIS — E7800 Pure hypercholesterolemia, unspecified: Secondary | ICD-10-CM | POA: Diagnosis not present

## 2021-03-04 DIAGNOSIS — I480 Paroxysmal atrial fibrillation: Secondary | ICD-10-CM | POA: Diagnosis not present

## 2021-03-04 DIAGNOSIS — W19XXXA Unspecified fall, initial encounter: Secondary | ICD-10-CM | POA: Diagnosis not present

## 2021-03-04 DIAGNOSIS — D6869 Other thrombophilia: Secondary | ICD-10-CM | POA: Diagnosis not present

## 2021-03-04 DIAGNOSIS — S01311A Laceration without foreign body of right ear, initial encounter: Secondary | ICD-10-CM | POA: Diagnosis not present

## 2021-03-04 DIAGNOSIS — I10 Essential (primary) hypertension: Secondary | ICD-10-CM | POA: Diagnosis not present

## 2021-03-11 DIAGNOSIS — D6869 Other thrombophilia: Secondary | ICD-10-CM | POA: Diagnosis not present

## 2021-03-11 DIAGNOSIS — I480 Paroxysmal atrial fibrillation: Secondary | ICD-10-CM | POA: Diagnosis not present

## 2021-03-11 DIAGNOSIS — S01311D Laceration without foreign body of right ear, subsequent encounter: Secondary | ICD-10-CM | POA: Diagnosis not present

## 2021-03-17 DIAGNOSIS — L570 Actinic keratosis: Secondary | ICD-10-CM | POA: Diagnosis not present

## 2021-03-17 DIAGNOSIS — L814 Other melanin hyperpigmentation: Secondary | ICD-10-CM | POA: Diagnosis not present

## 2021-03-17 DIAGNOSIS — L821 Other seborrheic keratosis: Secondary | ICD-10-CM | POA: Diagnosis not present

## 2021-03-18 NOTE — Progress Notes (Signed)
Remote pacemaker transmission.   

## 2021-03-24 IMAGING — DX DG ANKLE COMPLETE 3+V*R*
4 series · 4 of 4 positions shown · non-contrast
Comparison: None.

CLINICAL DATA: Swelling, bruising and pain in the right ankle. No
reported injury.

EXAM:
RIGHT ANKLE - COMPLETE 3+ VIEW

[dg ankle 2 views right (1 of 4)]
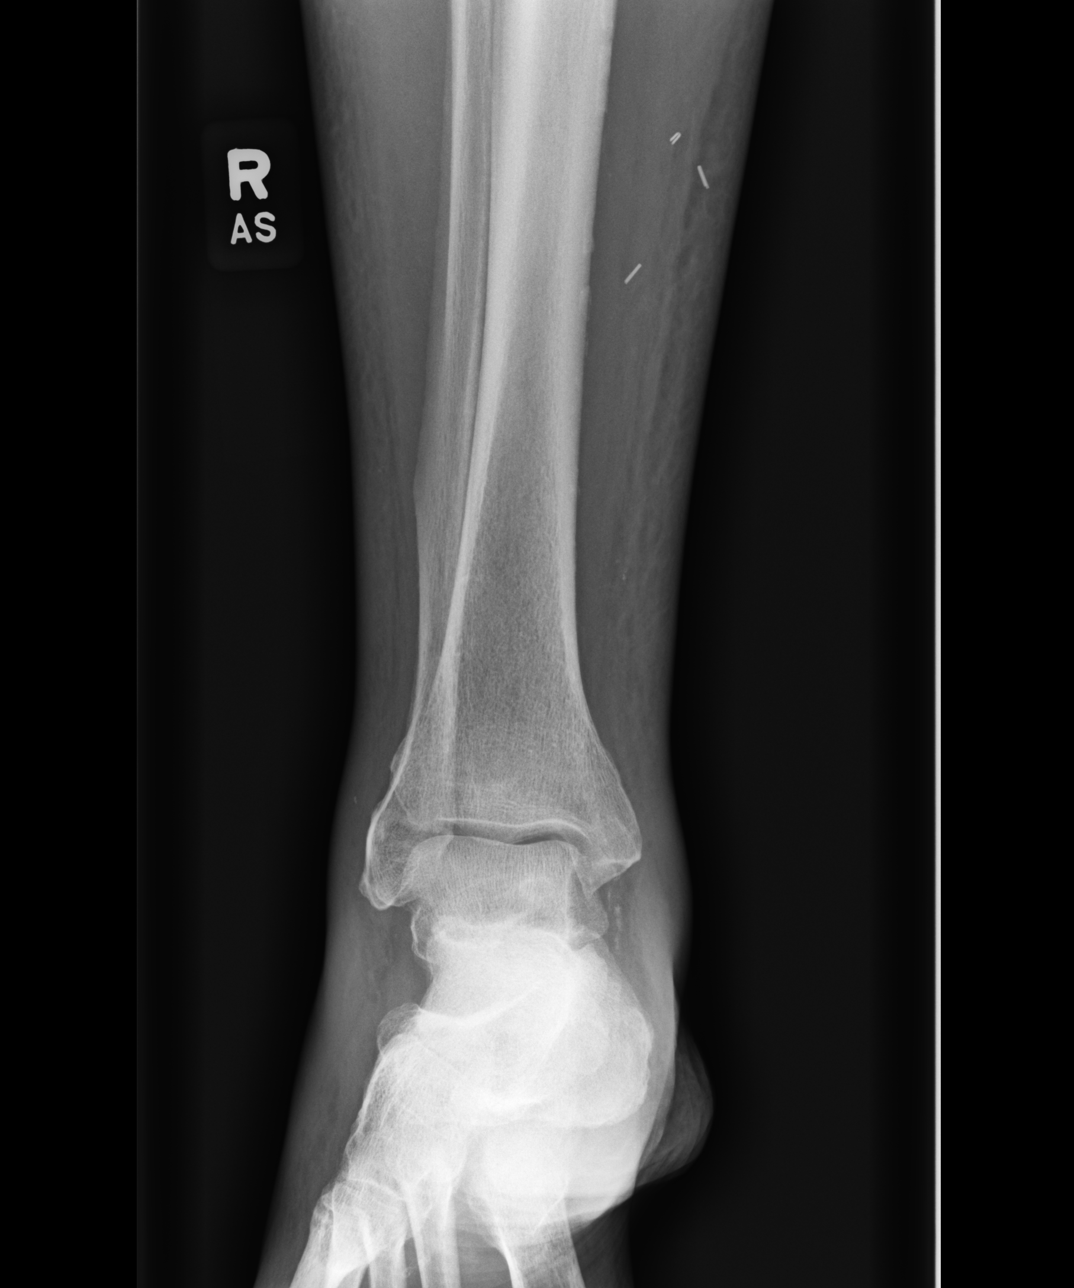

[dg ankle 2 views right (2 of 4)]
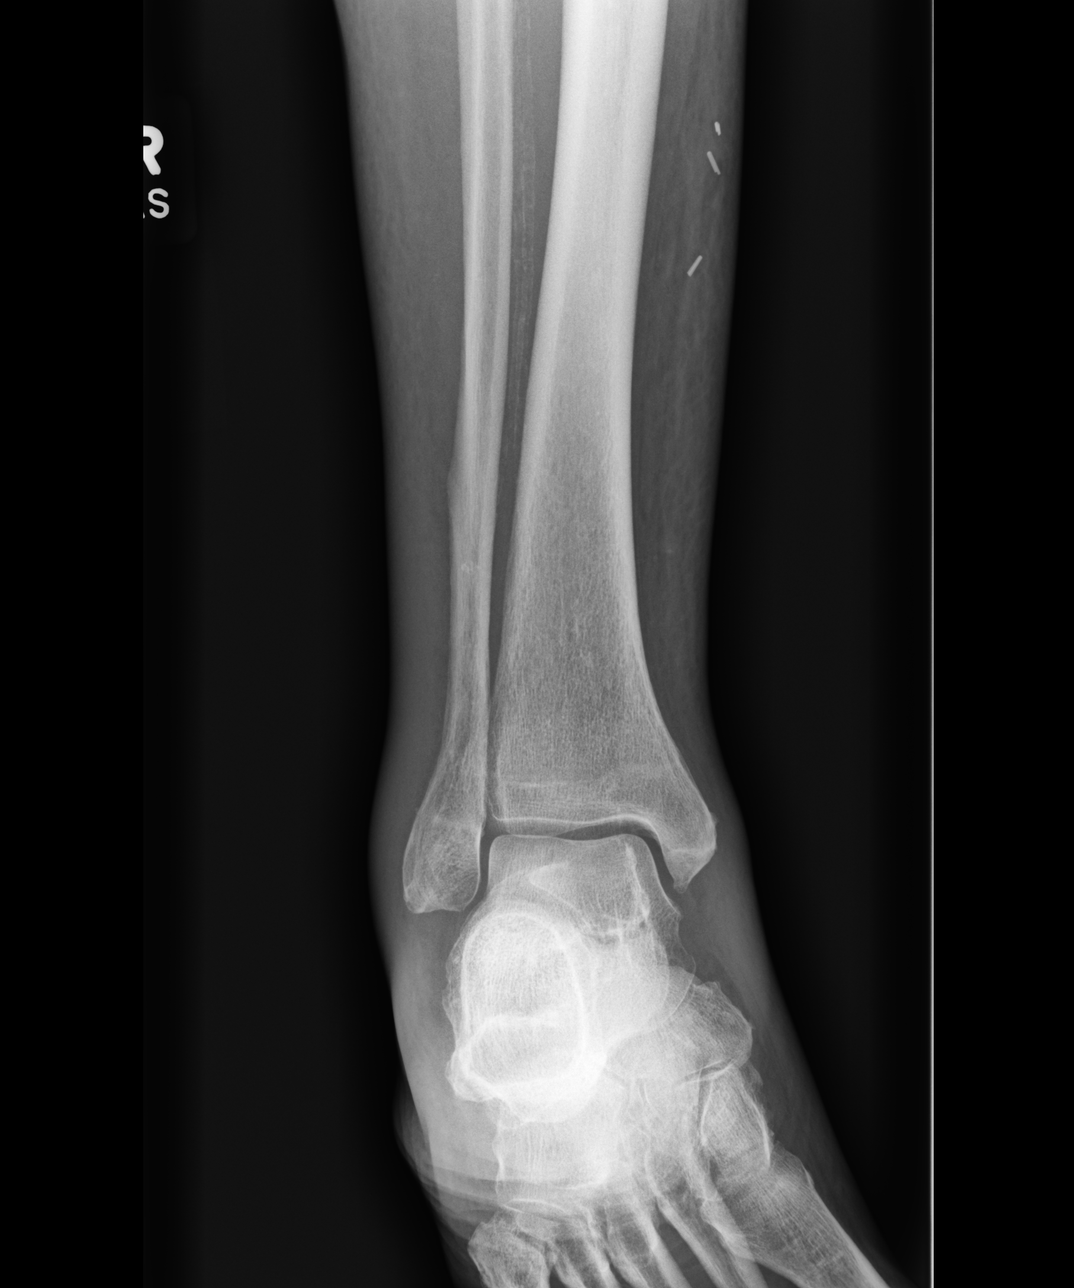

[dg ankle 2 views right (3 of 4)]
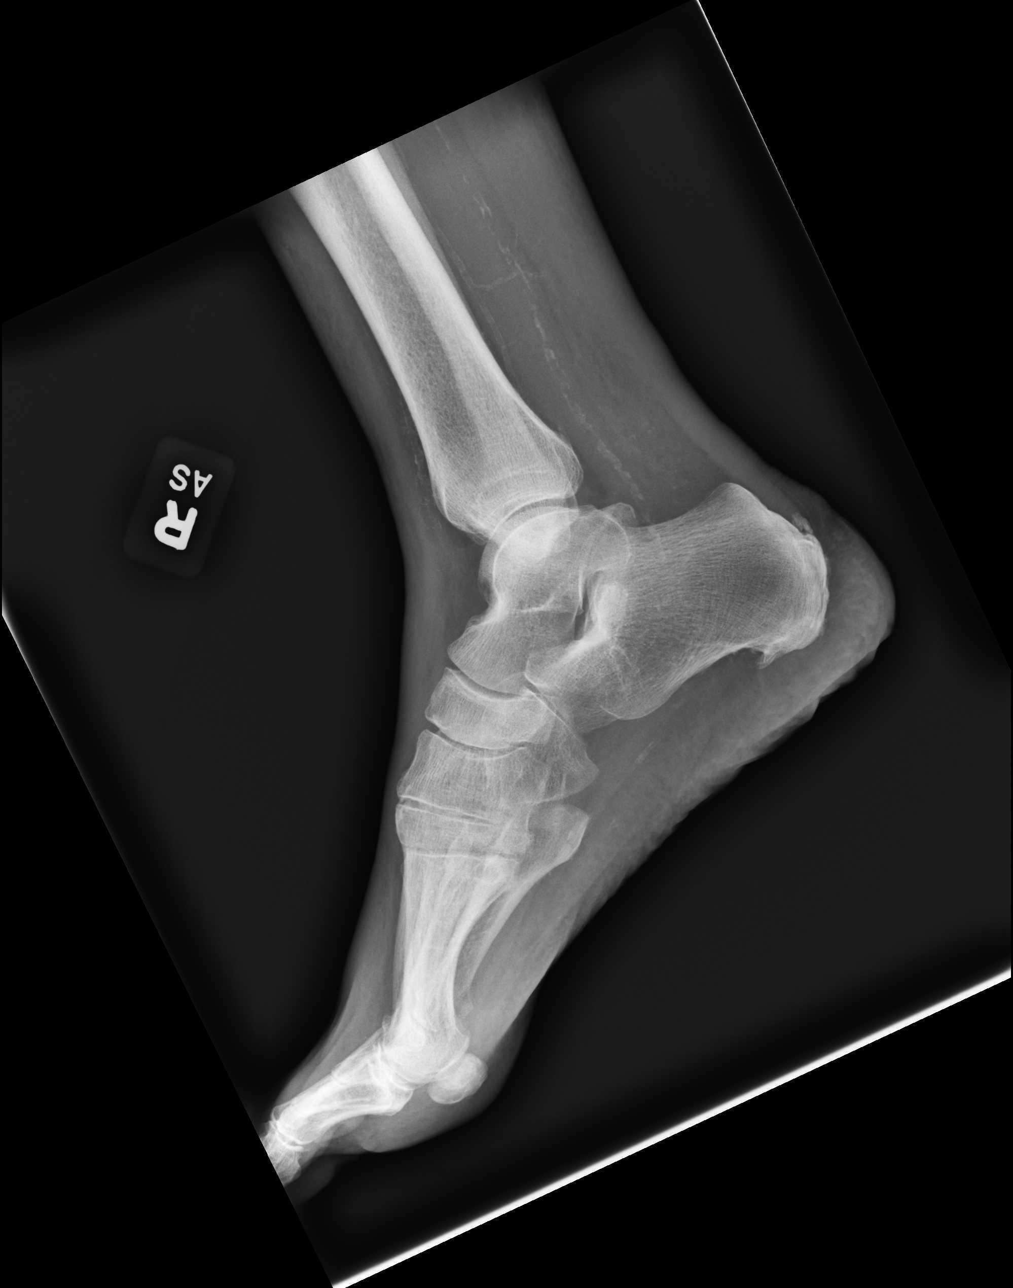

[dg ankle 2 views right (4 of 4)]
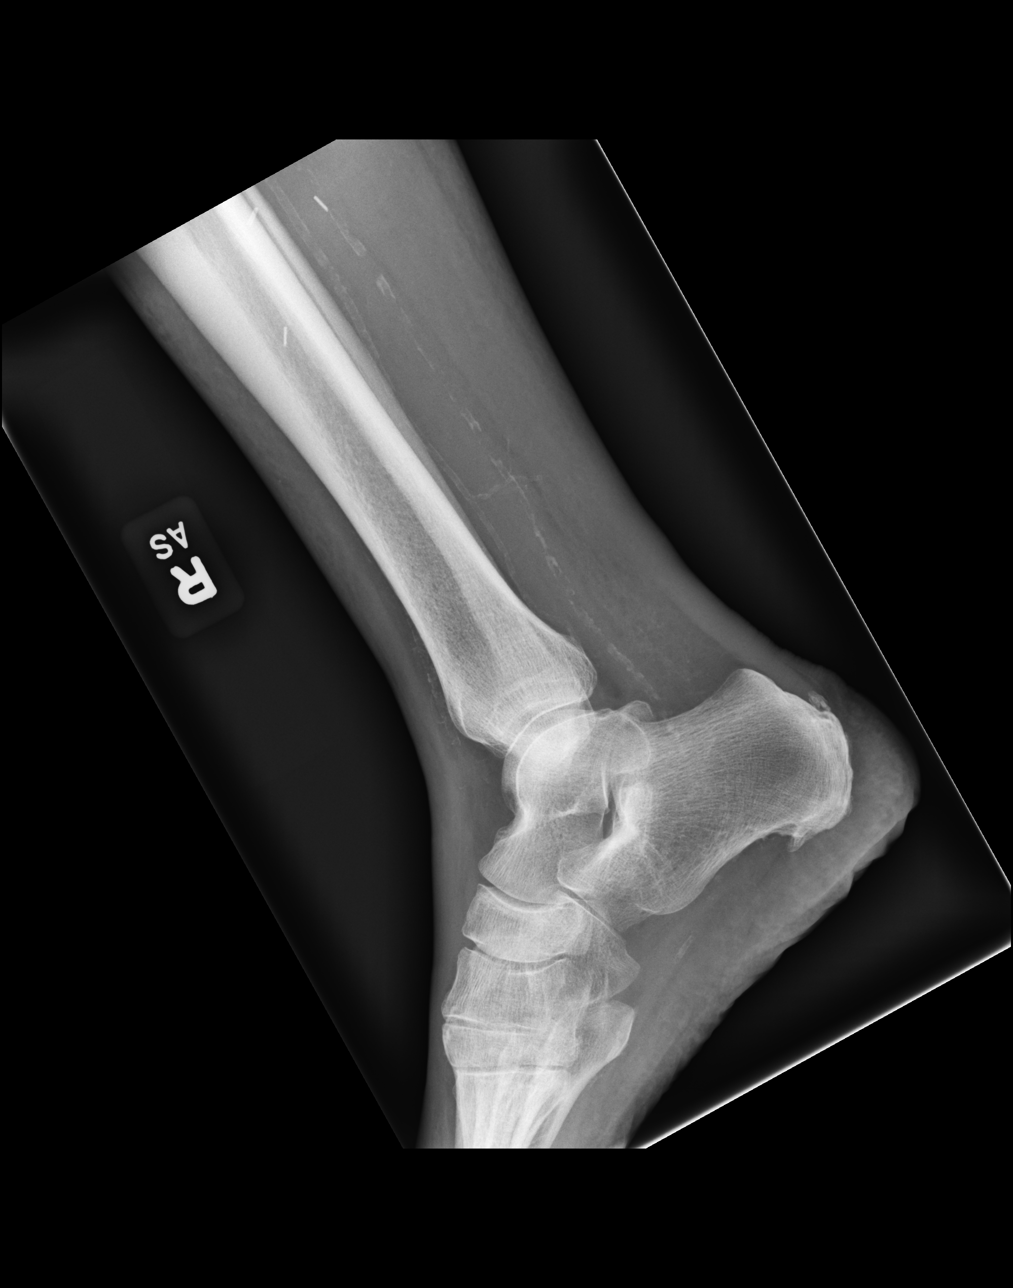

[4 of 4 positions shown; findings below may reference images not displayed]

FINDINGS: Mild diffuse soft tissue swelling. Vascular calcifications
throughout the soft tissues. Surgical clips medial to the distal
right tibial shaft. No fracture or subluxation. Small Achilles and
plantar right calcaneal spurs. No osseous erosions or suspicious
focal osseous lesions.
IMPRESSION: 1. Mild diffuse soft tissue swelling. No fracture or subluxation in
the right ankle.
2. Small Achilles and plantar right calcaneal spurs.

## 2021-04-15 DIAGNOSIS — R269 Unspecified abnormalities of gait and mobility: Secondary | ICD-10-CM | POA: Diagnosis not present

## 2021-04-15 DIAGNOSIS — E1169 Type 2 diabetes mellitus with other specified complication: Secondary | ICD-10-CM | POA: Diagnosis not present

## 2021-04-15 DIAGNOSIS — Z950 Presence of cardiac pacemaker: Secondary | ICD-10-CM | POA: Diagnosis not present

## 2021-04-15 DIAGNOSIS — Z79899 Other long term (current) drug therapy: Secondary | ICD-10-CM | POA: Diagnosis not present

## 2021-04-15 DIAGNOSIS — I10 Essential (primary) hypertension: Secondary | ICD-10-CM | POA: Diagnosis not present

## 2021-04-15 DIAGNOSIS — I480 Paroxysmal atrial fibrillation: Secondary | ICD-10-CM | POA: Diagnosis not present

## 2021-04-15 DIAGNOSIS — E1151 Type 2 diabetes mellitus with diabetic peripheral angiopathy without gangrene: Secondary | ICD-10-CM | POA: Diagnosis not present

## 2021-05-01 ENCOUNTER — Other Ambulatory Visit: Payer: Self-pay

## 2021-05-01 DIAGNOSIS — I739 Peripheral vascular disease, unspecified: Secondary | ICD-10-CM

## 2021-05-01 DIAGNOSIS — I714 Abdominal aortic aneurysm, without rupture, unspecified: Secondary | ICD-10-CM

## 2021-05-19 DIAGNOSIS — D485 Neoplasm of uncertain behavior of skin: Secondary | ICD-10-CM | POA: Diagnosis not present

## 2021-05-19 DIAGNOSIS — C44622 Squamous cell carcinoma of skin of right upper limb, including shoulder: Secondary | ICD-10-CM | POA: Diagnosis not present

## 2021-05-23 ENCOUNTER — Other Ambulatory Visit: Payer: Self-pay

## 2021-05-23 ENCOUNTER — Ambulatory Visit (INDEPENDENT_AMBULATORY_CARE_PROVIDER_SITE_OTHER)
Admission: RE | Admit: 2021-05-23 | Discharge: 2021-05-23 | Disposition: A | Discharge status: Home / Self Care | Payer: Medicare Other | Source: Ambulatory Visit | Attending: Vascular Surgery | Admitting: Vascular Surgery

## 2021-05-23 ENCOUNTER — Ambulatory Visit: Payer: Medicare Other | Admitting: Physician Assistant

## 2021-05-23 ENCOUNTER — Ambulatory Visit (HOSPITAL_COMMUNITY)
Admission: RE | Admit: 2021-05-23 | Discharge: 2021-05-23 | Disposition: A | Discharge status: Home / Self Care | Payer: Medicare Other | Source: Ambulatory Visit | Attending: Vascular Surgery | Admitting: Vascular Surgery

## 2021-05-23 VITALS — BP 167/77 | HR 62 | Temp 97.9°F | Resp 16 | Ht 65.0 in | Wt 143.0 lb

## 2021-05-23 DIAGNOSIS — I714 Abdominal aortic aneurysm, without rupture, unspecified: Secondary | ICD-10-CM

## 2021-05-23 DIAGNOSIS — I739 Peripheral vascular disease, unspecified: Secondary | ICD-10-CM | POA: Insufficient documentation

## 2021-05-23 NOTE — Progress Notes (Signed)
Office Note     CC:  follow up Requesting Provider:  Lajean Manes, MD  HPI: [name] [name] is a 87 y.o. ([birth year]) male who presents for surveillance of endovascular repair of abdominal aortic aneurysm in 2014 by Dr. Donnetta Hutching.  He is accompanied by his friend and caregiver [name].  He denies any new or changing abdominal or back pain.  He also denies any claudication, rest pain, or nonhealing wounds of bilateral lower extremities.  He was told by his PCP that he has neuropathy of his legs which is contributing to bilateral symmetrical leg weakness.  He is on a statin daily.  He is on Eliquis for atrial fibrillation.   Past Medical History:  Diagnosis Date   AAA (abdominal aortic aneurysm) ([place])    Abnormal Holter monitor finding November 2012   Runs of SVT with profound bradycardia   Aortic stenosis    s/p AVR in 2004   Arthritis    Bifascicular block    Complete heart block ([place]) 08/23/15   MDT PPM Dr. Curt Bears    Coronary artery disease    s/p CABG in 2004   Hypercholesterolemia    On Simvastatin   Right bundle branch block    Sinus bradycardia    Spasmodic torticollis    recent with persistant headaches    Past Surgical History:  Procedure Laterality Date   ABDOMINAL AORTIC ENDOVASCULAR STENT GRAFT N/A 08/18/2013   Procedure: ABDOMINAL AORTIC ENDOVASCULAR STENT GRAFT;  Surgeon: Rosetta Posner, MD;  Location: [facility];  Service: Vascular;  Laterality: N/A;   adeniod ectomy     AORTIC VALVE REPLACEMENT  10-30-02   insertion of a bioprosthetic #80m AVR   CARDIAC CATHETERIZATION N/A 08/21/2015   Procedure: Temporary Pacemaker;  Surgeon: MSherren Mocha MD;  [facility];  Service: Cardiovascular;  Laterality: N/A;   CORONARY ARTERY BYPASS GRAFT  10-30-02   x8 per Dr. BCyndia Bentwith LIMA to LAD, SVG to PD, SVG to 2nd DX/1st DX, & SVG to subbranch of the 1st DX & 1st OM   EP IMPLANTABLE DEVICE N/A 08/23/2015   Procedure: Pacemaker Implant;  Surgeon: Will MMeredith Leeds  MD;  [facility];  Service: Cardiovascular;  Laterality: N/A;   INGUINAL HERNIA REPAIR Bilateral 01/15/2015   Procedure: LAPAROSCOPIC BILATERAL INGUINAL HERNIA REPAIR;  Surgeon: ARalene Ok MD;  [facility];  Service: General;  Laterality: Bilateral;    Social History   Socioeconomic History   Marital status: Widowed    Spouse name: Not on file   Number of children: Not on file   Years of education: Not on file   Highest education level: Not on file  Occupational History   Not on file  Tobacco Use   Smoking status: Former    Types: Cigarettes    Quit date: 08/15/1953    Years since quitting: 67.8   Smokeless tobacco: Former    Quit date: 08/15/1954  Vaping Use   Vaping Use: Never used  Substance and Sexual Activity   Alcohol use: No   Drug use: No   Sexual activity: Not on file  Other Topics Concern   Not on file  Social History Narrative   Not on file   Social Determinants of Health   Financial Resource Strain: Not on file  Food Insecurity: Not on file  Transportation Needs: Not on file  Physical Activity: Not on file  Stress: Not on file  Social Connections: Not on file  Intimate Partner  Violence: Not on file    Family History  Problem Relation Age of Onset   Hypertension Mother    Hypertension Sister     Current Outpatient Medications  Medication Sig Dispense Refill   acetaminophen (TYLENOL) 325 MG tablet Take 325 mg by mouth every 6 (six) hours as needed for mild pain.      atorvastatin (LIPITOR) 10 MG tablet Take 10 mg by mouth daily.     brimonidine (ALPHAGAN P) 0.1 % SOLN Place 1 drop into both eyes 2 (two) times daily.      carvedilol (COREG) 12.5 MG tablet Take 1 tablet (12.5 mg total) by mouth 2 (two) times daily. 180 tablet 3   docusate sodium (COLACE) 100 MG capsule Take 200 mg by mouth daily.     furosemide (LASIX) 40 MG tablet Take 40 mg by mouth daily.  1   LUMIGAN 0.01 % SOLN Place 1 drop into both eyes at bedtime.  3    polyethylene glycol powder (GLYCOLAX/MIRALAX) powder Take 17 g by mouth daily.     potassium chloride SA (KLOR-CON M20) 20 MEQ tablet TAKE 1 TABLET (20 MEQ TOTAL) BY MOUTH DAILY. 90 tablet 2   apixaban (ELIQUIS) 5 MG TABS tablet Take 1 tablet (5 mg total) by mouth 2 (two) times daily. 180 tablet 3   No current facility-administered medications for this visit.    No Known Allergies   REVIEW OF SYSTEMS:   '[X]'$  denotes positive finding, '[ ]'$  denotes negative finding Cardiac  Comments:  Chest pain or chest pressure:    Shortness of breath upon exertion:    Short of breath when lying flat:    Irregular heart rhythm:        Vascular    Pain in calf, thigh, or hip brought on by ambulation:    Pain in feet at night that wakes you up from your sleep:     Blood clot in your veins:    Leg swelling:         Pulmonary    Oxygen at home:    Productive cough:     Wheezing:         Neurologic    Sudden weakness in arms or legs:     Sudden numbness in arms or legs:     Sudden onset of difficulty speaking or slurred speech:    Temporary loss of vision in one eye:     Problems with dizziness:         Gastrointestinal    Blood in stool:     Vomited blood:         Genitourinary    Burning when urinating:     Blood in urine:        Psychiatric    Major depression:         Hematologic    Bleeding problems:    Problems with blood clotting too easily:        Skin    Rashes or ulcers:        Constitutional    Fever or chills:      PHYSICAL EXAMINATION:  Vitals:   05/23/21 0958  BP: (!) 167/77  Pulse: 62  Resp: 16  Temp: 97.9 F (36.6 C)  TempSrc: Temporal  SpO2: 99%  Weight: 143 lb (64.9 kg)  Height: '5\' 5"'$  (1.651 m)    General:  WDWN in NAD; vital signs documented above Gait: Not observed HENT: WNL, normocephalic Pulmonary: normal non-labored breathing , without Rales,  rhonchi,  wheezing Cardiac: regular HR Abdomen: soft, NT, no masses Skin: without  rashes Vascular Exam/Pulses:  Right Left  Radial 2+ (normal) 2+ (normal)  DP absent absent  PT absent absent   Extremities: without ischemic changes, without Gangrene , without cellulitis; without open wounds;  Musculoskeletal: no muscle wasting or atrophy  Neurologic: A&O X 3;  No focal weakness or paresthesias are detected Psychiatric:  The pt has Normal affect.   Non-Invasive Vascular Imaging:   AAA sac 5.2 cm in size.  2 years ago maximum diameter measurement was 5 cm  ABI/TBIToday's ABIToday's TBIPrevious ABIPrevious TBI  +-------+-----------+-----------+------------+------------+  Right  [place]         0.61       [place]          0.69          +-------+-----------+-----------+------------+------------+  Left   [place]         0.66       [place]          0.88          +-------+-----------+-----------+------------+------------+  ASSESSMENT/PLAN:: 87 y.o. male here for follow up for surveillance of endovascular repair of abdominal aortic aneurysm in 2014  -Duplex demonstrates a stable AAA sac essentially unchanged over the past 2 years.  There are no obvious endoleak's. -ABIs demonstrate noncompressible tibial vessels however patient is without rest pain or tissue loss.  No indication for further work-up. -Continue statin daily -Recheck duplex in 2 years   Dagoberto Ligas, PA-C [facility] [phone]  Clinic MD:   Virl Cagey

## 2021-05-26 DIAGNOSIS — Z23 Encounter for immunization: Secondary | ICD-10-CM | POA: Diagnosis not present

## 2021-05-26 DIAGNOSIS — R269 Unspecified abnormalities of gait and mobility: Secondary | ICD-10-CM | POA: Diagnosis not present

## 2021-05-26 DIAGNOSIS — B372 Candidiasis of skin and nail: Secondary | ICD-10-CM | POA: Diagnosis not present

## 2021-05-28 ENCOUNTER — Ambulatory Visit (INDEPENDENT_AMBULATORY_CARE_PROVIDER_SITE_OTHER): Payer: Medicare Other

## 2021-05-28 DIAGNOSIS — I495 Sick sinus syndrome: Secondary | ICD-10-CM

## 2021-05-29 DIAGNOSIS — I459 Conduction disorder, unspecified: Secondary | ICD-10-CM | POA: Diagnosis not present

## 2021-05-29 DIAGNOSIS — E7800 Pure hypercholesterolemia, unspecified: Secondary | ICD-10-CM | POA: Diagnosis not present

## 2021-05-29 DIAGNOSIS — D696 Thrombocytopenia, unspecified: Secondary | ICD-10-CM | POA: Diagnosis not present

## 2021-05-29 DIAGNOSIS — Z950 Presence of cardiac pacemaker: Secondary | ICD-10-CM | POA: Diagnosis not present

## 2021-05-29 DIAGNOSIS — I2510 Atherosclerotic heart disease of native coronary artery without angina pectoris: Secondary | ICD-10-CM | POA: Diagnosis not present

## 2021-05-29 DIAGNOSIS — Z7901 Long term (current) use of anticoagulants: Secondary | ICD-10-CM | POA: Diagnosis not present

## 2021-05-29 DIAGNOSIS — I10 Essential (primary) hypertension: Secondary | ICD-10-CM | POA: Diagnosis not present

## 2021-05-29 DIAGNOSIS — Z87891 Personal history of nicotine dependence: Secondary | ICD-10-CM | POA: Diagnosis not present

## 2021-05-29 DIAGNOSIS — Z955 Presence of coronary angioplasty implant and graft: Secondary | ICD-10-CM | POA: Diagnosis not present

## 2021-05-29 DIAGNOSIS — L309 Dermatitis, unspecified: Secondary | ICD-10-CM | POA: Diagnosis not present

## 2021-05-29 DIAGNOSIS — D509 Iron deficiency anemia, unspecified: Secondary | ICD-10-CM | POA: Diagnosis not present

## 2021-05-29 DIAGNOSIS — E1151 Type 2 diabetes mellitus with diabetic peripheral angiopathy without gangrene: Secondary | ICD-10-CM | POA: Diagnosis not present

## 2021-05-29 DIAGNOSIS — I480 Paroxysmal atrial fibrillation: Secondary | ICD-10-CM | POA: Diagnosis not present

## 2021-05-29 LAB — CUP PACEART REMOTE DEVICE CHECK
Battery Remaining Longevity: 39 mo
Battery Voltage: 2.97 V
Brady Statistic AP VP Percent: 81 %
Brady Statistic AP VS Percent: 5.18 %
Brady Statistic AS VP Percent: 12.95 %
Brady Statistic AS VS Percent: 0.87 %
Brady Statistic RA Percent Paced: 85.21 %
Brady Statistic RV Percent Paced: 93.79 %
Date Time Interrogation Session: 20220921153138
Implantable Lead Implant Date: 20161216
Implantable Lead Implant Date: 20161216
Implantable Lead Location: 753859
Implantable Lead Location: 753860
Implantable Lead Model: 5076
Implantable Lead Model: 5076
Implantable Pulse Generator Implant Date: 20161216
Lead Channel Impedance Value: 323 Ohm
Lead Channel Impedance Value: 342 Ohm
Lead Channel Impedance Value: 399 Ohm
Lead Channel Impedance Value: 437 Ohm
Lead Channel Pacing Threshold Amplitude: 0.5 V
Lead Channel Pacing Threshold Amplitude: 0.625 V
Lead Channel Pacing Threshold Pulse Width: 0.4 ms
Lead Channel Pacing Threshold Pulse Width: 0.4 ms
Lead Channel Sensing Intrinsic Amplitude: 1 mV
Lead Channel Sensing Intrinsic Amplitude: 1 mV
Lead Channel Sensing Intrinsic Amplitude: 12.75 mV
Lead Channel Sensing Intrinsic Amplitude: 12.75 mV
Lead Channel Setting Pacing Amplitude: 2 V
Lead Channel Setting Pacing Amplitude: 2.5 V
Lead Channel Setting Pacing Pulse Width: 0.4 ms
Lead Channel Setting Sensing Sensitivity: 2 mV

## 2021-06-04 DIAGNOSIS — I480 Paroxysmal atrial fibrillation: Secondary | ICD-10-CM | POA: Diagnosis not present

## 2021-06-04 DIAGNOSIS — D696 Thrombocytopenia, unspecified: Secondary | ICD-10-CM | POA: Diagnosis not present

## 2021-06-04 DIAGNOSIS — Z955 Presence of coronary angioplasty implant and graft: Secondary | ICD-10-CM | POA: Diagnosis not present

## 2021-06-04 DIAGNOSIS — Z7901 Long term (current) use of anticoagulants: Secondary | ICD-10-CM | POA: Diagnosis not present

## 2021-06-04 DIAGNOSIS — L309 Dermatitis, unspecified: Secondary | ICD-10-CM | POA: Diagnosis not present

## 2021-06-04 DIAGNOSIS — E7800 Pure hypercholesterolemia, unspecified: Secondary | ICD-10-CM | POA: Diagnosis not present

## 2021-06-04 DIAGNOSIS — I459 Conduction disorder, unspecified: Secondary | ICD-10-CM | POA: Diagnosis not present

## 2021-06-04 DIAGNOSIS — Z87891 Personal history of nicotine dependence: Secondary | ICD-10-CM | POA: Diagnosis not present

## 2021-06-04 DIAGNOSIS — E1151 Type 2 diabetes mellitus with diabetic peripheral angiopathy without gangrene: Secondary | ICD-10-CM | POA: Diagnosis not present

## 2021-06-04 DIAGNOSIS — I2510 Atherosclerotic heart disease of native coronary artery without angina pectoris: Secondary | ICD-10-CM | POA: Diagnosis not present

## 2021-06-04 DIAGNOSIS — D509 Iron deficiency anemia, unspecified: Secondary | ICD-10-CM | POA: Diagnosis not present

## 2021-06-04 DIAGNOSIS — Z950 Presence of cardiac pacemaker: Secondary | ICD-10-CM | POA: Diagnosis not present

## 2021-06-04 DIAGNOSIS — I10 Essential (primary) hypertension: Secondary | ICD-10-CM | POA: Diagnosis not present

## 2021-06-04 NOTE — Progress Notes (Signed)
Remote pacemaker transmission.   

## 2021-06-05 DIAGNOSIS — I480 Paroxysmal atrial fibrillation: Secondary | ICD-10-CM | POA: Diagnosis not present

## 2021-06-05 DIAGNOSIS — E1169 Type 2 diabetes mellitus with other specified complication: Secondary | ICD-10-CM | POA: Diagnosis not present

## 2021-06-05 DIAGNOSIS — E7800 Pure hypercholesterolemia, unspecified: Secondary | ICD-10-CM | POA: Diagnosis not present

## 2021-06-05 DIAGNOSIS — E1151 Type 2 diabetes mellitus with diabetic peripheral angiopathy without gangrene: Secondary | ICD-10-CM | POA: Diagnosis not present

## 2021-06-05 DIAGNOSIS — I10 Essential (primary) hypertension: Secondary | ICD-10-CM | POA: Diagnosis not present

## 2021-06-06 DIAGNOSIS — Z955 Presence of coronary angioplasty implant and graft: Secondary | ICD-10-CM | POA: Diagnosis not present

## 2021-06-06 DIAGNOSIS — D696 Thrombocytopenia, unspecified: Secondary | ICD-10-CM | POA: Diagnosis not present

## 2021-06-06 DIAGNOSIS — Z7901 Long term (current) use of anticoagulants: Secondary | ICD-10-CM | POA: Diagnosis not present

## 2021-06-06 DIAGNOSIS — Z87891 Personal history of nicotine dependence: Secondary | ICD-10-CM | POA: Diagnosis not present

## 2021-06-06 DIAGNOSIS — E1151 Type 2 diabetes mellitus with diabetic peripheral angiopathy without gangrene: Secondary | ICD-10-CM | POA: Diagnosis not present

## 2021-06-06 DIAGNOSIS — L309 Dermatitis, unspecified: Secondary | ICD-10-CM | POA: Diagnosis not present

## 2021-06-06 DIAGNOSIS — D509 Iron deficiency anemia, unspecified: Secondary | ICD-10-CM | POA: Diagnosis not present

## 2021-06-06 DIAGNOSIS — E7800 Pure hypercholesterolemia, unspecified: Secondary | ICD-10-CM | POA: Diagnosis not present

## 2021-06-06 DIAGNOSIS — I2510 Atherosclerotic heart disease of native coronary artery without angina pectoris: Secondary | ICD-10-CM | POA: Diagnosis not present

## 2021-06-06 DIAGNOSIS — Z950 Presence of cardiac pacemaker: Secondary | ICD-10-CM | POA: Diagnosis not present

## 2021-06-06 DIAGNOSIS — I459 Conduction disorder, unspecified: Secondary | ICD-10-CM | POA: Diagnosis not present

## 2021-06-06 DIAGNOSIS — I10 Essential (primary) hypertension: Secondary | ICD-10-CM | POA: Diagnosis not present

## 2021-06-06 DIAGNOSIS — I480 Paroxysmal atrial fibrillation: Secondary | ICD-10-CM | POA: Diagnosis not present

## 2021-06-09 DIAGNOSIS — Z87891 Personal history of nicotine dependence: Secondary | ICD-10-CM | POA: Diagnosis not present

## 2021-06-09 DIAGNOSIS — I2510 Atherosclerotic heart disease of native coronary artery without angina pectoris: Secondary | ICD-10-CM | POA: Diagnosis not present

## 2021-06-09 DIAGNOSIS — E7800 Pure hypercholesterolemia, unspecified: Secondary | ICD-10-CM | POA: Diagnosis not present

## 2021-06-09 DIAGNOSIS — L309 Dermatitis, unspecified: Secondary | ICD-10-CM | POA: Diagnosis not present

## 2021-06-09 DIAGNOSIS — Z950 Presence of cardiac pacemaker: Secondary | ICD-10-CM | POA: Diagnosis not present

## 2021-06-09 DIAGNOSIS — D696 Thrombocytopenia, unspecified: Secondary | ICD-10-CM | POA: Diagnosis not present

## 2021-06-09 DIAGNOSIS — I10 Essential (primary) hypertension: Secondary | ICD-10-CM | POA: Diagnosis not present

## 2021-06-09 DIAGNOSIS — Z955 Presence of coronary angioplasty implant and graft: Secondary | ICD-10-CM | POA: Diagnosis not present

## 2021-06-09 DIAGNOSIS — I480 Paroxysmal atrial fibrillation: Secondary | ICD-10-CM | POA: Diagnosis not present

## 2021-06-09 DIAGNOSIS — I459 Conduction disorder, unspecified: Secondary | ICD-10-CM | POA: Diagnosis not present

## 2021-06-09 DIAGNOSIS — Z7901 Long term (current) use of anticoagulants: Secondary | ICD-10-CM | POA: Diagnosis not present

## 2021-06-09 DIAGNOSIS — E1151 Type 2 diabetes mellitus with diabetic peripheral angiopathy without gangrene: Secondary | ICD-10-CM | POA: Diagnosis not present

## 2021-06-09 DIAGNOSIS — D509 Iron deficiency anemia, unspecified: Secondary | ICD-10-CM | POA: Diagnosis not present

## 2021-06-10 DIAGNOSIS — Z7901 Long term (current) use of anticoagulants: Secondary | ICD-10-CM | POA: Diagnosis not present

## 2021-06-10 DIAGNOSIS — I2510 Atherosclerotic heart disease of native coronary artery without angina pectoris: Secondary | ICD-10-CM | POA: Diagnosis not present

## 2021-06-10 DIAGNOSIS — Z950 Presence of cardiac pacemaker: Secondary | ICD-10-CM | POA: Diagnosis not present

## 2021-06-10 DIAGNOSIS — D509 Iron deficiency anemia, unspecified: Secondary | ICD-10-CM | POA: Diagnosis not present

## 2021-06-10 DIAGNOSIS — E7800 Pure hypercholesterolemia, unspecified: Secondary | ICD-10-CM | POA: Diagnosis not present

## 2021-06-10 DIAGNOSIS — I10 Essential (primary) hypertension: Secondary | ICD-10-CM | POA: Diagnosis not present

## 2021-06-10 DIAGNOSIS — D696 Thrombocytopenia, unspecified: Secondary | ICD-10-CM | POA: Diagnosis not present

## 2021-06-10 DIAGNOSIS — Z955 Presence of coronary angioplasty implant and graft: Secondary | ICD-10-CM | POA: Diagnosis not present

## 2021-06-10 DIAGNOSIS — I480 Paroxysmal atrial fibrillation: Secondary | ICD-10-CM | POA: Diagnosis not present

## 2021-06-10 DIAGNOSIS — Z87891 Personal history of nicotine dependence: Secondary | ICD-10-CM | POA: Diagnosis not present

## 2021-06-10 DIAGNOSIS — L309 Dermatitis, unspecified: Secondary | ICD-10-CM | POA: Diagnosis not present

## 2021-06-10 DIAGNOSIS — E1151 Type 2 diabetes mellitus with diabetic peripheral angiopathy without gangrene: Secondary | ICD-10-CM | POA: Diagnosis not present

## 2021-06-10 DIAGNOSIS — I459 Conduction disorder, unspecified: Secondary | ICD-10-CM | POA: Diagnosis not present

## 2021-06-11 DIAGNOSIS — Z955 Presence of coronary angioplasty implant and graft: Secondary | ICD-10-CM | POA: Diagnosis not present

## 2021-06-11 DIAGNOSIS — I10 Essential (primary) hypertension: Secondary | ICD-10-CM | POA: Diagnosis not present

## 2021-06-11 DIAGNOSIS — I480 Paroxysmal atrial fibrillation: Secondary | ICD-10-CM | POA: Diagnosis not present

## 2021-06-11 DIAGNOSIS — E7800 Pure hypercholesterolemia, unspecified: Secondary | ICD-10-CM | POA: Diagnosis not present

## 2021-06-11 DIAGNOSIS — D509 Iron deficiency anemia, unspecified: Secondary | ICD-10-CM | POA: Diagnosis not present

## 2021-06-11 DIAGNOSIS — I2510 Atherosclerotic heart disease of native coronary artery without angina pectoris: Secondary | ICD-10-CM | POA: Diagnosis not present

## 2021-06-11 DIAGNOSIS — Z950 Presence of cardiac pacemaker: Secondary | ICD-10-CM | POA: Diagnosis not present

## 2021-06-11 DIAGNOSIS — L309 Dermatitis, unspecified: Secondary | ICD-10-CM | POA: Diagnosis not present

## 2021-06-11 DIAGNOSIS — Z87891 Personal history of nicotine dependence: Secondary | ICD-10-CM | POA: Diagnosis not present

## 2021-06-11 DIAGNOSIS — D696 Thrombocytopenia, unspecified: Secondary | ICD-10-CM | POA: Diagnosis not present

## 2021-06-11 DIAGNOSIS — I459 Conduction disorder, unspecified: Secondary | ICD-10-CM | POA: Diagnosis not present

## 2021-06-11 DIAGNOSIS — Z7901 Long term (current) use of anticoagulants: Secondary | ICD-10-CM | POA: Diagnosis not present

## 2021-06-11 DIAGNOSIS — E1151 Type 2 diabetes mellitus with diabetic peripheral angiopathy without gangrene: Secondary | ICD-10-CM | POA: Diagnosis not present

## 2021-06-12 DIAGNOSIS — I2510 Atherosclerotic heart disease of native coronary artery without angina pectoris: Secondary | ICD-10-CM | POA: Diagnosis not present

## 2021-06-12 DIAGNOSIS — Z7901 Long term (current) use of anticoagulants: Secondary | ICD-10-CM | POA: Diagnosis not present

## 2021-06-12 DIAGNOSIS — E7800 Pure hypercholesterolemia, unspecified: Secondary | ICD-10-CM | POA: Diagnosis not present

## 2021-06-12 DIAGNOSIS — Z87891 Personal history of nicotine dependence: Secondary | ICD-10-CM | POA: Diagnosis not present

## 2021-06-12 DIAGNOSIS — E1151 Type 2 diabetes mellitus with diabetic peripheral angiopathy without gangrene: Secondary | ICD-10-CM | POA: Diagnosis not present

## 2021-06-12 DIAGNOSIS — D696 Thrombocytopenia, unspecified: Secondary | ICD-10-CM | POA: Diagnosis not present

## 2021-06-12 DIAGNOSIS — I10 Essential (primary) hypertension: Secondary | ICD-10-CM | POA: Diagnosis not present

## 2021-06-12 DIAGNOSIS — L309 Dermatitis, unspecified: Secondary | ICD-10-CM | POA: Diagnosis not present

## 2021-06-12 DIAGNOSIS — I480 Paroxysmal atrial fibrillation: Secondary | ICD-10-CM | POA: Diagnosis not present

## 2021-06-12 DIAGNOSIS — Z950 Presence of cardiac pacemaker: Secondary | ICD-10-CM | POA: Diagnosis not present

## 2021-06-12 DIAGNOSIS — Z955 Presence of coronary angioplasty implant and graft: Secondary | ICD-10-CM | POA: Diagnosis not present

## 2021-06-12 DIAGNOSIS — I459 Conduction disorder, unspecified: Secondary | ICD-10-CM | POA: Diagnosis not present

## 2021-06-12 DIAGNOSIS — D509 Iron deficiency anemia, unspecified: Secondary | ICD-10-CM | POA: Diagnosis not present

## 2021-06-13 DIAGNOSIS — D696 Thrombocytopenia, unspecified: Secondary | ICD-10-CM | POA: Diagnosis not present

## 2021-06-13 DIAGNOSIS — I480 Paroxysmal atrial fibrillation: Secondary | ICD-10-CM | POA: Diagnosis not present

## 2021-06-13 DIAGNOSIS — I459 Conduction disorder, unspecified: Secondary | ICD-10-CM | POA: Diagnosis not present

## 2021-06-13 DIAGNOSIS — D509 Iron deficiency anemia, unspecified: Secondary | ICD-10-CM | POA: Diagnosis not present

## 2021-06-13 DIAGNOSIS — L309 Dermatitis, unspecified: Secondary | ICD-10-CM | POA: Diagnosis not present

## 2021-06-13 DIAGNOSIS — Z87891 Personal history of nicotine dependence: Secondary | ICD-10-CM | POA: Diagnosis not present

## 2021-06-13 DIAGNOSIS — E1151 Type 2 diabetes mellitus with diabetic peripheral angiopathy without gangrene: Secondary | ICD-10-CM | POA: Diagnosis not present

## 2021-06-13 DIAGNOSIS — Z950 Presence of cardiac pacemaker: Secondary | ICD-10-CM | POA: Diagnosis not present

## 2021-06-13 DIAGNOSIS — Z7901 Long term (current) use of anticoagulants: Secondary | ICD-10-CM | POA: Diagnosis not present

## 2021-06-13 DIAGNOSIS — E7800 Pure hypercholesterolemia, unspecified: Secondary | ICD-10-CM | POA: Diagnosis not present

## 2021-06-13 DIAGNOSIS — I2510 Atherosclerotic heart disease of native coronary artery without angina pectoris: Secondary | ICD-10-CM | POA: Diagnosis not present

## 2021-06-13 DIAGNOSIS — I10 Essential (primary) hypertension: Secondary | ICD-10-CM | POA: Diagnosis not present

## 2021-06-13 DIAGNOSIS — Z955 Presence of coronary angioplasty implant and graft: Secondary | ICD-10-CM | POA: Diagnosis not present

## 2021-06-17 DIAGNOSIS — Z950 Presence of cardiac pacemaker: Secondary | ICD-10-CM | POA: Diagnosis not present

## 2021-06-17 DIAGNOSIS — L309 Dermatitis, unspecified: Secondary | ICD-10-CM | POA: Diagnosis not present

## 2021-06-17 DIAGNOSIS — D696 Thrombocytopenia, unspecified: Secondary | ICD-10-CM | POA: Diagnosis not present

## 2021-06-17 DIAGNOSIS — Z7901 Long term (current) use of anticoagulants: Secondary | ICD-10-CM | POA: Diagnosis not present

## 2021-06-17 DIAGNOSIS — I10 Essential (primary) hypertension: Secondary | ICD-10-CM | POA: Diagnosis not present

## 2021-06-17 DIAGNOSIS — I2510 Atherosclerotic heart disease of native coronary artery without angina pectoris: Secondary | ICD-10-CM | POA: Diagnosis not present

## 2021-06-17 DIAGNOSIS — I480 Paroxysmal atrial fibrillation: Secondary | ICD-10-CM | POA: Diagnosis not present

## 2021-06-17 DIAGNOSIS — Z955 Presence of coronary angioplasty implant and graft: Secondary | ICD-10-CM | POA: Diagnosis not present

## 2021-06-17 DIAGNOSIS — I459 Conduction disorder, unspecified: Secondary | ICD-10-CM | POA: Diagnosis not present

## 2021-06-17 DIAGNOSIS — E7800 Pure hypercholesterolemia, unspecified: Secondary | ICD-10-CM | POA: Diagnosis not present

## 2021-06-17 DIAGNOSIS — D509 Iron deficiency anemia, unspecified: Secondary | ICD-10-CM | POA: Diagnosis not present

## 2021-06-17 DIAGNOSIS — E1151 Type 2 diabetes mellitus with diabetic peripheral angiopathy without gangrene: Secondary | ICD-10-CM | POA: Diagnosis not present

## 2021-06-17 DIAGNOSIS — Z87891 Personal history of nicotine dependence: Secondary | ICD-10-CM | POA: Diagnosis not present

## 2021-06-19 DIAGNOSIS — D696 Thrombocytopenia, unspecified: Secondary | ICD-10-CM | POA: Diagnosis not present

## 2021-06-19 DIAGNOSIS — Z87891 Personal history of nicotine dependence: Secondary | ICD-10-CM | POA: Diagnosis not present

## 2021-06-19 DIAGNOSIS — L309 Dermatitis, unspecified: Secondary | ICD-10-CM | POA: Diagnosis not present

## 2021-06-19 DIAGNOSIS — Z7901 Long term (current) use of anticoagulants: Secondary | ICD-10-CM | POA: Diagnosis not present

## 2021-06-19 DIAGNOSIS — Z950 Presence of cardiac pacemaker: Secondary | ICD-10-CM | POA: Diagnosis not present

## 2021-06-19 DIAGNOSIS — I10 Essential (primary) hypertension: Secondary | ICD-10-CM | POA: Diagnosis not present

## 2021-06-19 DIAGNOSIS — E1151 Type 2 diabetes mellitus with diabetic peripheral angiopathy without gangrene: Secondary | ICD-10-CM | POA: Diagnosis not present

## 2021-06-19 DIAGNOSIS — I480 Paroxysmal atrial fibrillation: Secondary | ICD-10-CM | POA: Diagnosis not present

## 2021-06-19 DIAGNOSIS — I459 Conduction disorder, unspecified: Secondary | ICD-10-CM | POA: Diagnosis not present

## 2021-06-19 DIAGNOSIS — Z955 Presence of coronary angioplasty implant and graft: Secondary | ICD-10-CM | POA: Diagnosis not present

## 2021-06-19 DIAGNOSIS — D509 Iron deficiency anemia, unspecified: Secondary | ICD-10-CM | POA: Diagnosis not present

## 2021-06-19 DIAGNOSIS — E7800 Pure hypercholesterolemia, unspecified: Secondary | ICD-10-CM | POA: Diagnosis not present

## 2021-06-19 DIAGNOSIS — I2510 Atherosclerotic heart disease of native coronary artery without angina pectoris: Secondary | ICD-10-CM | POA: Diagnosis not present

## 2021-06-20 DIAGNOSIS — H401132 Primary open-angle glaucoma, bilateral, moderate stage: Secondary | ICD-10-CM | POA: Diagnosis not present

## 2021-06-25 DIAGNOSIS — L309 Dermatitis, unspecified: Secondary | ICD-10-CM | POA: Diagnosis not present

## 2021-06-25 DIAGNOSIS — I459 Conduction disorder, unspecified: Secondary | ICD-10-CM | POA: Diagnosis not present

## 2021-06-25 DIAGNOSIS — Z955 Presence of coronary angioplasty implant and graft: Secondary | ICD-10-CM | POA: Diagnosis not present

## 2021-06-25 DIAGNOSIS — C44622 Squamous cell carcinoma of skin of right upper limb, including shoulder: Secondary | ICD-10-CM | POA: Diagnosis not present

## 2021-06-25 DIAGNOSIS — Z87891 Personal history of nicotine dependence: Secondary | ICD-10-CM | POA: Diagnosis not present

## 2021-06-25 DIAGNOSIS — Z950 Presence of cardiac pacemaker: Secondary | ICD-10-CM | POA: Diagnosis not present

## 2021-06-25 DIAGNOSIS — E1151 Type 2 diabetes mellitus with diabetic peripheral angiopathy without gangrene: Secondary | ICD-10-CM | POA: Diagnosis not present

## 2021-06-25 DIAGNOSIS — I480 Paroxysmal atrial fibrillation: Secondary | ICD-10-CM | POA: Diagnosis not present

## 2021-06-25 DIAGNOSIS — D696 Thrombocytopenia, unspecified: Secondary | ICD-10-CM | POA: Diagnosis not present

## 2021-06-25 DIAGNOSIS — Z7901 Long term (current) use of anticoagulants: Secondary | ICD-10-CM | POA: Diagnosis not present

## 2021-06-25 DIAGNOSIS — I2510 Atherosclerotic heart disease of native coronary artery without angina pectoris: Secondary | ICD-10-CM | POA: Diagnosis not present

## 2021-06-25 DIAGNOSIS — D509 Iron deficiency anemia, unspecified: Secondary | ICD-10-CM | POA: Diagnosis not present

## 2021-06-25 DIAGNOSIS — I10 Essential (primary) hypertension: Secondary | ICD-10-CM | POA: Diagnosis not present

## 2021-06-25 DIAGNOSIS — E7800 Pure hypercholesterolemia, unspecified: Secondary | ICD-10-CM | POA: Diagnosis not present

## 2021-06-27 DIAGNOSIS — K5901 Slow transit constipation: Secondary | ICD-10-CM | POA: Diagnosis not present

## 2021-06-27 DIAGNOSIS — L309 Dermatitis, unspecified: Secondary | ICD-10-CM | POA: Diagnosis not present

## 2021-06-27 DIAGNOSIS — I480 Paroxysmal atrial fibrillation: Secondary | ICD-10-CM | POA: Diagnosis not present

## 2021-06-27 DIAGNOSIS — Z79899 Other long term (current) drug therapy: Secondary | ICD-10-CM | POA: Diagnosis not present

## 2021-06-27 DIAGNOSIS — Z87891 Personal history of nicotine dependence: Secondary | ICD-10-CM | POA: Diagnosis not present

## 2021-06-27 DIAGNOSIS — E1151 Type 2 diabetes mellitus with diabetic peripheral angiopathy without gangrene: Secondary | ICD-10-CM | POA: Diagnosis not present

## 2021-06-27 DIAGNOSIS — I2510 Atherosclerotic heart disease of native coronary artery without angina pectoris: Secondary | ICD-10-CM | POA: Diagnosis not present

## 2021-06-27 DIAGNOSIS — Z955 Presence of coronary angioplasty implant and graft: Secondary | ICD-10-CM | POA: Diagnosis not present

## 2021-06-27 DIAGNOSIS — Z7901 Long term (current) use of anticoagulants: Secondary | ICD-10-CM | POA: Diagnosis not present

## 2021-06-27 DIAGNOSIS — E1169 Type 2 diabetes mellitus with other specified complication: Secondary | ICD-10-CM | POA: Diagnosis not present

## 2021-06-27 DIAGNOSIS — D6869 Other thrombophilia: Secondary | ICD-10-CM | POA: Diagnosis not present

## 2021-06-27 DIAGNOSIS — D696 Thrombocytopenia, unspecified: Secondary | ICD-10-CM | POA: Diagnosis not present

## 2021-06-27 DIAGNOSIS — I10 Essential (primary) hypertension: Secondary | ICD-10-CM | POA: Diagnosis not present

## 2021-06-27 DIAGNOSIS — D509 Iron deficiency anemia, unspecified: Secondary | ICD-10-CM | POA: Diagnosis not present

## 2021-06-27 DIAGNOSIS — Z1389 Encounter for screening for other disorder: Secondary | ICD-10-CM | POA: Diagnosis not present

## 2021-06-27 DIAGNOSIS — D692 Other nonthrombocytopenic purpura: Secondary | ICD-10-CM | POA: Diagnosis not present

## 2021-06-27 DIAGNOSIS — Z0000 Encounter for general adult medical examination without abnormal findings: Secondary | ICD-10-CM | POA: Diagnosis not present

## 2021-06-27 DIAGNOSIS — E7800 Pure hypercholesterolemia, unspecified: Secondary | ICD-10-CM | POA: Diagnosis not present

## 2021-06-27 DIAGNOSIS — I459 Conduction disorder, unspecified: Secondary | ICD-10-CM | POA: Diagnosis not present

## 2021-06-27 DIAGNOSIS — Z950 Presence of cardiac pacemaker: Secondary | ICD-10-CM | POA: Diagnosis not present

## 2021-07-01 DIAGNOSIS — E1151 Type 2 diabetes mellitus with diabetic peripheral angiopathy without gangrene: Secondary | ICD-10-CM | POA: Diagnosis not present

## 2021-07-01 DIAGNOSIS — I480 Paroxysmal atrial fibrillation: Secondary | ICD-10-CM | POA: Diagnosis not present

## 2021-07-01 DIAGNOSIS — I459 Conduction disorder, unspecified: Secondary | ICD-10-CM | POA: Diagnosis not present

## 2021-07-01 DIAGNOSIS — D509 Iron deficiency anemia, unspecified: Secondary | ICD-10-CM | POA: Diagnosis not present

## 2021-07-01 DIAGNOSIS — Z955 Presence of coronary angioplasty implant and graft: Secondary | ICD-10-CM | POA: Diagnosis not present

## 2021-07-01 DIAGNOSIS — L309 Dermatitis, unspecified: Secondary | ICD-10-CM | POA: Diagnosis not present

## 2021-07-01 DIAGNOSIS — Z87891 Personal history of nicotine dependence: Secondary | ICD-10-CM | POA: Diagnosis not present

## 2021-07-01 DIAGNOSIS — E7800 Pure hypercholesterolemia, unspecified: Secondary | ICD-10-CM | POA: Diagnosis not present

## 2021-07-01 DIAGNOSIS — D696 Thrombocytopenia, unspecified: Secondary | ICD-10-CM | POA: Diagnosis not present

## 2021-07-01 DIAGNOSIS — Z7901 Long term (current) use of anticoagulants: Secondary | ICD-10-CM | POA: Diagnosis not present

## 2021-07-01 DIAGNOSIS — I2510 Atherosclerotic heart disease of native coronary artery without angina pectoris: Secondary | ICD-10-CM | POA: Diagnosis not present

## 2021-07-01 DIAGNOSIS — I10 Essential (primary) hypertension: Secondary | ICD-10-CM | POA: Diagnosis not present

## 2021-07-01 DIAGNOSIS — Z950 Presence of cardiac pacemaker: Secondary | ICD-10-CM | POA: Diagnosis not present

## 2021-07-02 DIAGNOSIS — W19XXXA Unspecified fall, initial encounter: Secondary | ICD-10-CM | POA: Diagnosis not present

## 2021-07-02 DIAGNOSIS — S51801A Unspecified open wound of right forearm, initial encounter: Secondary | ICD-10-CM | POA: Diagnosis not present

## 2021-07-02 DIAGNOSIS — R296 Repeated falls: Secondary | ICD-10-CM | POA: Diagnosis not present

## 2021-07-07 DIAGNOSIS — L7621 Postprocedural hemorrhage and hematoma of skin and subcutaneous tissue following a dermatologic procedure: Secondary | ICD-10-CM | POA: Diagnosis not present

## 2021-07-10 DIAGNOSIS — D509 Iron deficiency anemia, unspecified: Secondary | ICD-10-CM | POA: Diagnosis not present

## 2021-07-10 DIAGNOSIS — L309 Dermatitis, unspecified: Secondary | ICD-10-CM | POA: Diagnosis not present

## 2021-07-10 DIAGNOSIS — I10 Essential (primary) hypertension: Secondary | ICD-10-CM | POA: Diagnosis not present

## 2021-07-10 DIAGNOSIS — Z950 Presence of cardiac pacemaker: Secondary | ICD-10-CM | POA: Diagnosis not present

## 2021-07-10 DIAGNOSIS — D696 Thrombocytopenia, unspecified: Secondary | ICD-10-CM | POA: Diagnosis not present

## 2021-07-10 DIAGNOSIS — I480 Paroxysmal atrial fibrillation: Secondary | ICD-10-CM | POA: Diagnosis not present

## 2021-07-10 DIAGNOSIS — Z87891 Personal history of nicotine dependence: Secondary | ICD-10-CM | POA: Diagnosis not present

## 2021-07-10 DIAGNOSIS — I459 Conduction disorder, unspecified: Secondary | ICD-10-CM | POA: Diagnosis not present

## 2021-07-10 DIAGNOSIS — Z955 Presence of coronary angioplasty implant and graft: Secondary | ICD-10-CM | POA: Diagnosis not present

## 2021-07-10 DIAGNOSIS — E7800 Pure hypercholesterolemia, unspecified: Secondary | ICD-10-CM | POA: Diagnosis not present

## 2021-07-10 DIAGNOSIS — I2510 Atherosclerotic heart disease of native coronary artery without angina pectoris: Secondary | ICD-10-CM | POA: Diagnosis not present

## 2021-07-10 DIAGNOSIS — Z7901 Long term (current) use of anticoagulants: Secondary | ICD-10-CM | POA: Diagnosis not present

## 2021-07-10 DIAGNOSIS — E1151 Type 2 diabetes mellitus with diabetic peripheral angiopathy without gangrene: Secondary | ICD-10-CM | POA: Diagnosis not present

## 2021-07-18 DIAGNOSIS — D696 Thrombocytopenia, unspecified: Secondary | ICD-10-CM | POA: Diagnosis not present

## 2021-07-18 DIAGNOSIS — I10 Essential (primary) hypertension: Secondary | ICD-10-CM | POA: Diagnosis not present

## 2021-07-18 DIAGNOSIS — Z950 Presence of cardiac pacemaker: Secondary | ICD-10-CM | POA: Diagnosis not present

## 2021-07-18 DIAGNOSIS — D509 Iron deficiency anemia, unspecified: Secondary | ICD-10-CM | POA: Diagnosis not present

## 2021-07-18 DIAGNOSIS — Z955 Presence of coronary angioplasty implant and graft: Secondary | ICD-10-CM | POA: Diagnosis not present

## 2021-07-18 DIAGNOSIS — E1151 Type 2 diabetes mellitus with diabetic peripheral angiopathy without gangrene: Secondary | ICD-10-CM | POA: Diagnosis not present

## 2021-07-18 DIAGNOSIS — I2510 Atherosclerotic heart disease of native coronary artery without angina pectoris: Secondary | ICD-10-CM | POA: Diagnosis not present

## 2021-07-18 DIAGNOSIS — I480 Paroxysmal atrial fibrillation: Secondary | ICD-10-CM | POA: Diagnosis not present

## 2021-07-18 DIAGNOSIS — L309 Dermatitis, unspecified: Secondary | ICD-10-CM | POA: Diagnosis not present

## 2021-07-18 DIAGNOSIS — I459 Conduction disorder, unspecified: Secondary | ICD-10-CM | POA: Diagnosis not present

## 2021-07-18 DIAGNOSIS — Z87891 Personal history of nicotine dependence: Secondary | ICD-10-CM | POA: Diagnosis not present

## 2021-07-18 DIAGNOSIS — Z7901 Long term (current) use of anticoagulants: Secondary | ICD-10-CM | POA: Diagnosis not present

## 2021-07-18 DIAGNOSIS — E7800 Pure hypercholesterolemia, unspecified: Secondary | ICD-10-CM | POA: Diagnosis not present

## 2021-07-23 DIAGNOSIS — S41101D Unspecified open wound of right upper arm, subsequent encounter: Secondary | ICD-10-CM | POA: Diagnosis not present

## 2021-07-24 DIAGNOSIS — I459 Conduction disorder, unspecified: Secondary | ICD-10-CM | POA: Diagnosis not present

## 2021-07-24 DIAGNOSIS — D696 Thrombocytopenia, unspecified: Secondary | ICD-10-CM | POA: Diagnosis not present

## 2021-07-24 DIAGNOSIS — I2510 Atherosclerotic heart disease of native coronary artery without angina pectoris: Secondary | ICD-10-CM | POA: Diagnosis not present

## 2021-07-24 DIAGNOSIS — E1151 Type 2 diabetes mellitus with diabetic peripheral angiopathy without gangrene: Secondary | ICD-10-CM | POA: Diagnosis not present

## 2021-07-24 DIAGNOSIS — Z87891 Personal history of nicotine dependence: Secondary | ICD-10-CM | POA: Diagnosis not present

## 2021-07-24 DIAGNOSIS — D509 Iron deficiency anemia, unspecified: Secondary | ICD-10-CM | POA: Diagnosis not present

## 2021-07-24 DIAGNOSIS — E7800 Pure hypercholesterolemia, unspecified: Secondary | ICD-10-CM | POA: Diagnosis not present

## 2021-07-24 DIAGNOSIS — Z7901 Long term (current) use of anticoagulants: Secondary | ICD-10-CM | POA: Diagnosis not present

## 2021-07-24 DIAGNOSIS — I480 Paroxysmal atrial fibrillation: Secondary | ICD-10-CM | POA: Diagnosis not present

## 2021-07-24 DIAGNOSIS — L309 Dermatitis, unspecified: Secondary | ICD-10-CM | POA: Diagnosis not present

## 2021-07-24 DIAGNOSIS — Z950 Presence of cardiac pacemaker: Secondary | ICD-10-CM | POA: Diagnosis not present

## 2021-07-24 DIAGNOSIS — Z955 Presence of coronary angioplasty implant and graft: Secondary | ICD-10-CM | POA: Diagnosis not present

## 2021-07-24 DIAGNOSIS — I10 Essential (primary) hypertension: Secondary | ICD-10-CM | POA: Diagnosis not present

## 2021-07-25 DIAGNOSIS — E7800 Pure hypercholesterolemia, unspecified: Secondary | ICD-10-CM | POA: Diagnosis not present

## 2021-07-25 DIAGNOSIS — I2510 Atherosclerotic heart disease of native coronary artery without angina pectoris: Secondary | ICD-10-CM | POA: Diagnosis not present

## 2021-07-25 DIAGNOSIS — D509 Iron deficiency anemia, unspecified: Secondary | ICD-10-CM | POA: Diagnosis not present

## 2021-07-25 DIAGNOSIS — E1151 Type 2 diabetes mellitus with diabetic peripheral angiopathy without gangrene: Secondary | ICD-10-CM | POA: Diagnosis not present

## 2021-07-25 DIAGNOSIS — Z87891 Personal history of nicotine dependence: Secondary | ICD-10-CM | POA: Diagnosis not present

## 2021-07-25 DIAGNOSIS — I480 Paroxysmal atrial fibrillation: Secondary | ICD-10-CM | POA: Diagnosis not present

## 2021-07-25 DIAGNOSIS — I10 Essential (primary) hypertension: Secondary | ICD-10-CM | POA: Diagnosis not present

## 2021-07-25 DIAGNOSIS — Z950 Presence of cardiac pacemaker: Secondary | ICD-10-CM | POA: Diagnosis not present

## 2021-07-25 DIAGNOSIS — L309 Dermatitis, unspecified: Secondary | ICD-10-CM | POA: Diagnosis not present

## 2021-07-25 DIAGNOSIS — Z955 Presence of coronary angioplasty implant and graft: Secondary | ICD-10-CM | POA: Diagnosis not present

## 2021-07-25 DIAGNOSIS — I459 Conduction disorder, unspecified: Secondary | ICD-10-CM | POA: Diagnosis not present

## 2021-07-25 DIAGNOSIS — Z7901 Long term (current) use of anticoagulants: Secondary | ICD-10-CM | POA: Diagnosis not present

## 2021-07-25 DIAGNOSIS — D696 Thrombocytopenia, unspecified: Secondary | ICD-10-CM | POA: Diagnosis not present

## 2021-07-28 DIAGNOSIS — I10 Essential (primary) hypertension: Secondary | ICD-10-CM | POA: Diagnosis not present

## 2021-07-28 DIAGNOSIS — Z87891 Personal history of nicotine dependence: Secondary | ICD-10-CM | POA: Diagnosis not present

## 2021-07-28 DIAGNOSIS — I2510 Atherosclerotic heart disease of native coronary artery without angina pectoris: Secondary | ICD-10-CM | POA: Diagnosis not present

## 2021-07-28 DIAGNOSIS — E7800 Pure hypercholesterolemia, unspecified: Secondary | ICD-10-CM | POA: Diagnosis not present

## 2021-07-28 DIAGNOSIS — Z7901 Long term (current) use of anticoagulants: Secondary | ICD-10-CM | POA: Diagnosis not present

## 2021-07-28 DIAGNOSIS — I459 Conduction disorder, unspecified: Secondary | ICD-10-CM | POA: Diagnosis not present

## 2021-07-28 DIAGNOSIS — K5901 Slow transit constipation: Secondary | ICD-10-CM | POA: Diagnosis not present

## 2021-07-28 DIAGNOSIS — E1151 Type 2 diabetes mellitus with diabetic peripheral angiopathy without gangrene: Secondary | ICD-10-CM | POA: Diagnosis not present

## 2021-07-28 DIAGNOSIS — R269 Unspecified abnormalities of gait and mobility: Secondary | ICD-10-CM | POA: Diagnosis not present

## 2021-07-28 DIAGNOSIS — D696 Thrombocytopenia, unspecified: Secondary | ICD-10-CM | POA: Diagnosis not present

## 2021-07-28 DIAGNOSIS — I480 Paroxysmal atrial fibrillation: Secondary | ICD-10-CM | POA: Diagnosis not present

## 2021-07-28 DIAGNOSIS — Z9181 History of falling: Secondary | ICD-10-CM | POA: Diagnosis not present

## 2021-07-28 DIAGNOSIS — Z950 Presence of cardiac pacemaker: Secondary | ICD-10-CM | POA: Diagnosis not present

## 2021-07-28 DIAGNOSIS — S51801D Unspecified open wound of right forearm, subsequent encounter: Secondary | ICD-10-CM | POA: Diagnosis not present

## 2021-07-28 DIAGNOSIS — L309 Dermatitis, unspecified: Secondary | ICD-10-CM | POA: Diagnosis not present

## 2021-07-28 DIAGNOSIS — D509 Iron deficiency anemia, unspecified: Secondary | ICD-10-CM | POA: Diagnosis not present

## 2021-07-28 DIAGNOSIS — Z955 Presence of coronary angioplasty implant and graft: Secondary | ICD-10-CM | POA: Diagnosis not present

## 2021-07-28 DIAGNOSIS — L89152 Pressure ulcer of sacral region, stage 2: Secondary | ICD-10-CM | POA: Diagnosis not present

## 2021-07-30 DIAGNOSIS — I459 Conduction disorder, unspecified: Secondary | ICD-10-CM | POA: Diagnosis not present

## 2021-07-30 DIAGNOSIS — E7800 Pure hypercholesterolemia, unspecified: Secondary | ICD-10-CM | POA: Diagnosis not present

## 2021-07-30 DIAGNOSIS — L309 Dermatitis, unspecified: Secondary | ICD-10-CM | POA: Diagnosis not present

## 2021-07-30 DIAGNOSIS — D509 Iron deficiency anemia, unspecified: Secondary | ICD-10-CM | POA: Diagnosis not present

## 2021-07-30 DIAGNOSIS — I10 Essential (primary) hypertension: Secondary | ICD-10-CM | POA: Diagnosis not present

## 2021-07-30 DIAGNOSIS — S51801D Unspecified open wound of right forearm, subsequent encounter: Secondary | ICD-10-CM | POA: Diagnosis not present

## 2021-07-30 DIAGNOSIS — Z87891 Personal history of nicotine dependence: Secondary | ICD-10-CM | POA: Diagnosis not present

## 2021-07-30 DIAGNOSIS — R269 Unspecified abnormalities of gait and mobility: Secondary | ICD-10-CM | POA: Diagnosis not present

## 2021-07-30 DIAGNOSIS — L89152 Pressure ulcer of sacral region, stage 2: Secondary | ICD-10-CM | POA: Diagnosis not present

## 2021-07-30 DIAGNOSIS — E1151 Type 2 diabetes mellitus with diabetic peripheral angiopathy without gangrene: Secondary | ICD-10-CM | POA: Diagnosis not present

## 2021-07-30 DIAGNOSIS — K5901 Slow transit constipation: Secondary | ICD-10-CM | POA: Diagnosis not present

## 2021-07-30 DIAGNOSIS — I2510 Atherosclerotic heart disease of native coronary artery without angina pectoris: Secondary | ICD-10-CM | POA: Diagnosis not present

## 2021-07-30 DIAGNOSIS — Z950 Presence of cardiac pacemaker: Secondary | ICD-10-CM | POA: Diagnosis not present

## 2021-07-30 DIAGNOSIS — D696 Thrombocytopenia, unspecified: Secondary | ICD-10-CM | POA: Diagnosis not present

## 2021-07-30 DIAGNOSIS — Z7901 Long term (current) use of anticoagulants: Secondary | ICD-10-CM | POA: Diagnosis not present

## 2021-07-30 DIAGNOSIS — Z9181 History of falling: Secondary | ICD-10-CM | POA: Diagnosis not present

## 2021-07-30 DIAGNOSIS — I480 Paroxysmal atrial fibrillation: Secondary | ICD-10-CM | POA: Diagnosis not present

## 2021-07-30 DIAGNOSIS — Z955 Presence of coronary angioplasty implant and graft: Secondary | ICD-10-CM | POA: Diagnosis not present

## 2021-08-01 DIAGNOSIS — I459 Conduction disorder, unspecified: Secondary | ICD-10-CM | POA: Diagnosis not present

## 2021-08-01 DIAGNOSIS — Z87891 Personal history of nicotine dependence: Secondary | ICD-10-CM | POA: Diagnosis not present

## 2021-08-01 DIAGNOSIS — Z7901 Long term (current) use of anticoagulants: Secondary | ICD-10-CM | POA: Diagnosis not present

## 2021-08-01 DIAGNOSIS — L309 Dermatitis, unspecified: Secondary | ICD-10-CM | POA: Diagnosis not present

## 2021-08-01 DIAGNOSIS — Z9181 History of falling: Secondary | ICD-10-CM | POA: Diagnosis not present

## 2021-08-01 DIAGNOSIS — D509 Iron deficiency anemia, unspecified: Secondary | ICD-10-CM | POA: Diagnosis not present

## 2021-08-01 DIAGNOSIS — I480 Paroxysmal atrial fibrillation: Secondary | ICD-10-CM | POA: Diagnosis not present

## 2021-08-01 DIAGNOSIS — S51801D Unspecified open wound of right forearm, subsequent encounter: Secondary | ICD-10-CM | POA: Diagnosis not present

## 2021-08-01 DIAGNOSIS — E1151 Type 2 diabetes mellitus with diabetic peripheral angiopathy without gangrene: Secondary | ICD-10-CM | POA: Diagnosis not present

## 2021-08-01 DIAGNOSIS — R269 Unspecified abnormalities of gait and mobility: Secondary | ICD-10-CM | POA: Diagnosis not present

## 2021-08-01 DIAGNOSIS — I10 Essential (primary) hypertension: Secondary | ICD-10-CM | POA: Diagnosis not present

## 2021-08-01 DIAGNOSIS — Z950 Presence of cardiac pacemaker: Secondary | ICD-10-CM | POA: Diagnosis not present

## 2021-08-01 DIAGNOSIS — I2510 Atherosclerotic heart disease of native coronary artery without angina pectoris: Secondary | ICD-10-CM | POA: Diagnosis not present

## 2021-08-01 DIAGNOSIS — Z955 Presence of coronary angioplasty implant and graft: Secondary | ICD-10-CM | POA: Diagnosis not present

## 2021-08-01 DIAGNOSIS — E7800 Pure hypercholesterolemia, unspecified: Secondary | ICD-10-CM | POA: Diagnosis not present

## 2021-08-01 DIAGNOSIS — L89152 Pressure ulcer of sacral region, stage 2: Secondary | ICD-10-CM | POA: Diagnosis not present

## 2021-08-01 DIAGNOSIS — D696 Thrombocytopenia, unspecified: Secondary | ICD-10-CM | POA: Diagnosis not present

## 2021-08-01 DIAGNOSIS — K5901 Slow transit constipation: Secondary | ICD-10-CM | POA: Diagnosis not present

## 2021-08-04 DIAGNOSIS — R269 Unspecified abnormalities of gait and mobility: Secondary | ICD-10-CM | POA: Diagnosis not present

## 2021-08-04 DIAGNOSIS — Z950 Presence of cardiac pacemaker: Secondary | ICD-10-CM | POA: Diagnosis not present

## 2021-08-04 DIAGNOSIS — Z9181 History of falling: Secondary | ICD-10-CM | POA: Diagnosis not present

## 2021-08-04 DIAGNOSIS — Z87891 Personal history of nicotine dependence: Secondary | ICD-10-CM | POA: Diagnosis not present

## 2021-08-04 DIAGNOSIS — Z955 Presence of coronary angioplasty implant and graft: Secondary | ICD-10-CM | POA: Diagnosis not present

## 2021-08-04 DIAGNOSIS — Z7901 Long term (current) use of anticoagulants: Secondary | ICD-10-CM | POA: Diagnosis not present

## 2021-08-04 DIAGNOSIS — I10 Essential (primary) hypertension: Secondary | ICD-10-CM | POA: Diagnosis not present

## 2021-08-04 DIAGNOSIS — K5901 Slow transit constipation: Secondary | ICD-10-CM | POA: Diagnosis not present

## 2021-08-04 DIAGNOSIS — E1151 Type 2 diabetes mellitus with diabetic peripheral angiopathy without gangrene: Secondary | ICD-10-CM | POA: Diagnosis not present

## 2021-08-04 DIAGNOSIS — S51801D Unspecified open wound of right forearm, subsequent encounter: Secondary | ICD-10-CM | POA: Diagnosis not present

## 2021-08-04 DIAGNOSIS — D509 Iron deficiency anemia, unspecified: Secondary | ICD-10-CM | POA: Diagnosis not present

## 2021-08-04 DIAGNOSIS — L89152 Pressure ulcer of sacral region, stage 2: Secondary | ICD-10-CM | POA: Diagnosis not present

## 2021-08-04 DIAGNOSIS — I2510 Atherosclerotic heart disease of native coronary artery without angina pectoris: Secondary | ICD-10-CM | POA: Diagnosis not present

## 2021-08-04 DIAGNOSIS — E7800 Pure hypercholesterolemia, unspecified: Secondary | ICD-10-CM | POA: Diagnosis not present

## 2021-08-04 DIAGNOSIS — L309 Dermatitis, unspecified: Secondary | ICD-10-CM | POA: Diagnosis not present

## 2021-08-04 DIAGNOSIS — I459 Conduction disorder, unspecified: Secondary | ICD-10-CM | POA: Diagnosis not present

## 2021-08-04 DIAGNOSIS — I480 Paroxysmal atrial fibrillation: Secondary | ICD-10-CM | POA: Diagnosis not present

## 2021-08-04 DIAGNOSIS — D696 Thrombocytopenia, unspecified: Secondary | ICD-10-CM | POA: Diagnosis not present

## 2021-08-05 DIAGNOSIS — Z950 Presence of cardiac pacemaker: Secondary | ICD-10-CM | POA: Diagnosis not present

## 2021-08-05 DIAGNOSIS — K5901 Slow transit constipation: Secondary | ICD-10-CM | POA: Diagnosis not present

## 2021-08-05 DIAGNOSIS — Z7901 Long term (current) use of anticoagulants: Secondary | ICD-10-CM | POA: Diagnosis not present

## 2021-08-05 DIAGNOSIS — I2510 Atherosclerotic heart disease of native coronary artery without angina pectoris: Secondary | ICD-10-CM | POA: Diagnosis not present

## 2021-08-05 DIAGNOSIS — E1151 Type 2 diabetes mellitus with diabetic peripheral angiopathy without gangrene: Secondary | ICD-10-CM | POA: Diagnosis not present

## 2021-08-05 DIAGNOSIS — D509 Iron deficiency anemia, unspecified: Secondary | ICD-10-CM | POA: Diagnosis not present

## 2021-08-05 DIAGNOSIS — Z9181 History of falling: Secondary | ICD-10-CM | POA: Diagnosis not present

## 2021-08-05 DIAGNOSIS — R269 Unspecified abnormalities of gait and mobility: Secondary | ICD-10-CM | POA: Diagnosis not present

## 2021-08-05 DIAGNOSIS — I480 Paroxysmal atrial fibrillation: Secondary | ICD-10-CM | POA: Diagnosis not present

## 2021-08-05 DIAGNOSIS — I10 Essential (primary) hypertension: Secondary | ICD-10-CM | POA: Diagnosis not present

## 2021-08-05 DIAGNOSIS — E7800 Pure hypercholesterolemia, unspecified: Secondary | ICD-10-CM | POA: Diagnosis not present

## 2021-08-05 DIAGNOSIS — Z87891 Personal history of nicotine dependence: Secondary | ICD-10-CM | POA: Diagnosis not present

## 2021-08-05 DIAGNOSIS — S51801D Unspecified open wound of right forearm, subsequent encounter: Secondary | ICD-10-CM | POA: Diagnosis not present

## 2021-08-05 DIAGNOSIS — L309 Dermatitis, unspecified: Secondary | ICD-10-CM | POA: Diagnosis not present

## 2021-08-05 DIAGNOSIS — Z955 Presence of coronary angioplasty implant and graft: Secondary | ICD-10-CM | POA: Diagnosis not present

## 2021-08-05 DIAGNOSIS — I459 Conduction disorder, unspecified: Secondary | ICD-10-CM | POA: Diagnosis not present

## 2021-08-05 DIAGNOSIS — L89152 Pressure ulcer of sacral region, stage 2: Secondary | ICD-10-CM | POA: Diagnosis not present

## 2021-08-05 DIAGNOSIS — D696 Thrombocytopenia, unspecified: Secondary | ICD-10-CM | POA: Diagnosis not present

## 2021-08-08 DIAGNOSIS — L89152 Pressure ulcer of sacral region, stage 2: Secondary | ICD-10-CM | POA: Diagnosis not present

## 2021-08-08 DIAGNOSIS — E7800 Pure hypercholesterolemia, unspecified: Secondary | ICD-10-CM | POA: Diagnosis not present

## 2021-08-08 DIAGNOSIS — D696 Thrombocytopenia, unspecified: Secondary | ICD-10-CM | POA: Diagnosis not present

## 2021-08-08 DIAGNOSIS — Z9181 History of falling: Secondary | ICD-10-CM | POA: Diagnosis not present

## 2021-08-08 DIAGNOSIS — K5901 Slow transit constipation: Secondary | ICD-10-CM | POA: Diagnosis not present

## 2021-08-08 DIAGNOSIS — Z7901 Long term (current) use of anticoagulants: Secondary | ICD-10-CM | POA: Diagnosis not present

## 2021-08-08 DIAGNOSIS — Z950 Presence of cardiac pacemaker: Secondary | ICD-10-CM | POA: Diagnosis not present

## 2021-08-08 DIAGNOSIS — D509 Iron deficiency anemia, unspecified: Secondary | ICD-10-CM | POA: Diagnosis not present

## 2021-08-08 DIAGNOSIS — E1151 Type 2 diabetes mellitus with diabetic peripheral angiopathy without gangrene: Secondary | ICD-10-CM | POA: Diagnosis not present

## 2021-08-08 DIAGNOSIS — I480 Paroxysmal atrial fibrillation: Secondary | ICD-10-CM | POA: Diagnosis not present

## 2021-08-08 DIAGNOSIS — S51801D Unspecified open wound of right forearm, subsequent encounter: Secondary | ICD-10-CM | POA: Diagnosis not present

## 2021-08-08 DIAGNOSIS — L309 Dermatitis, unspecified: Secondary | ICD-10-CM | POA: Diagnosis not present

## 2021-08-08 DIAGNOSIS — I459 Conduction disorder, unspecified: Secondary | ICD-10-CM | POA: Diagnosis not present

## 2021-08-08 DIAGNOSIS — I10 Essential (primary) hypertension: Secondary | ICD-10-CM | POA: Diagnosis not present

## 2021-08-08 DIAGNOSIS — Z87891 Personal history of nicotine dependence: Secondary | ICD-10-CM | POA: Diagnosis not present

## 2021-08-08 DIAGNOSIS — Z955 Presence of coronary angioplasty implant and graft: Secondary | ICD-10-CM | POA: Diagnosis not present

## 2021-08-08 DIAGNOSIS — I2510 Atherosclerotic heart disease of native coronary artery without angina pectoris: Secondary | ICD-10-CM | POA: Diagnosis not present

## 2021-08-08 DIAGNOSIS — R269 Unspecified abnormalities of gait and mobility: Secondary | ICD-10-CM | POA: Diagnosis not present

## 2021-08-11 DIAGNOSIS — Z7901 Long term (current) use of anticoagulants: Secondary | ICD-10-CM | POA: Diagnosis not present

## 2021-08-11 DIAGNOSIS — I459 Conduction disorder, unspecified: Secondary | ICD-10-CM | POA: Diagnosis not present

## 2021-08-11 DIAGNOSIS — E7800 Pure hypercholesterolemia, unspecified: Secondary | ICD-10-CM | POA: Diagnosis not present

## 2021-08-11 DIAGNOSIS — D509 Iron deficiency anemia, unspecified: Secondary | ICD-10-CM | POA: Diagnosis not present

## 2021-08-11 DIAGNOSIS — S51801D Unspecified open wound of right forearm, subsequent encounter: Secondary | ICD-10-CM | POA: Diagnosis not present

## 2021-08-11 DIAGNOSIS — D696 Thrombocytopenia, unspecified: Secondary | ICD-10-CM | POA: Diagnosis not present

## 2021-08-11 DIAGNOSIS — K5901 Slow transit constipation: Secondary | ICD-10-CM | POA: Diagnosis not present

## 2021-08-11 DIAGNOSIS — Z9181 History of falling: Secondary | ICD-10-CM | POA: Diagnosis not present

## 2021-08-11 DIAGNOSIS — I2510 Atherosclerotic heart disease of native coronary artery without angina pectoris: Secondary | ICD-10-CM | POA: Diagnosis not present

## 2021-08-11 DIAGNOSIS — I480 Paroxysmal atrial fibrillation: Secondary | ICD-10-CM | POA: Diagnosis not present

## 2021-08-11 DIAGNOSIS — Z950 Presence of cardiac pacemaker: Secondary | ICD-10-CM | POA: Diagnosis not present

## 2021-08-11 DIAGNOSIS — E1151 Type 2 diabetes mellitus with diabetic peripheral angiopathy without gangrene: Secondary | ICD-10-CM | POA: Diagnosis not present

## 2021-08-11 DIAGNOSIS — L309 Dermatitis, unspecified: Secondary | ICD-10-CM | POA: Diagnosis not present

## 2021-08-11 DIAGNOSIS — L89152 Pressure ulcer of sacral region, stage 2: Secondary | ICD-10-CM | POA: Diagnosis not present

## 2021-08-11 DIAGNOSIS — Z87891 Personal history of nicotine dependence: Secondary | ICD-10-CM | POA: Diagnosis not present

## 2021-08-11 DIAGNOSIS — Z955 Presence of coronary angioplasty implant and graft: Secondary | ICD-10-CM | POA: Diagnosis not present

## 2021-08-11 DIAGNOSIS — I10 Essential (primary) hypertension: Secondary | ICD-10-CM | POA: Diagnosis not present

## 2021-08-11 DIAGNOSIS — R269 Unspecified abnormalities of gait and mobility: Secondary | ICD-10-CM | POA: Diagnosis not present

## 2021-08-12 DIAGNOSIS — Z9181 History of falling: Secondary | ICD-10-CM | POA: Diagnosis not present

## 2021-08-12 DIAGNOSIS — I2510 Atherosclerotic heart disease of native coronary artery without angina pectoris: Secondary | ICD-10-CM | POA: Diagnosis not present

## 2021-08-12 DIAGNOSIS — Z955 Presence of coronary angioplasty implant and graft: Secondary | ICD-10-CM | POA: Diagnosis not present

## 2021-08-12 DIAGNOSIS — E7800 Pure hypercholesterolemia, unspecified: Secondary | ICD-10-CM | POA: Diagnosis not present

## 2021-08-12 DIAGNOSIS — Z7901 Long term (current) use of anticoagulants: Secondary | ICD-10-CM | POA: Diagnosis not present

## 2021-08-12 DIAGNOSIS — I480 Paroxysmal atrial fibrillation: Secondary | ICD-10-CM | POA: Diagnosis not present

## 2021-08-12 DIAGNOSIS — L309 Dermatitis, unspecified: Secondary | ICD-10-CM | POA: Diagnosis not present

## 2021-08-12 DIAGNOSIS — Z87891 Personal history of nicotine dependence: Secondary | ICD-10-CM | POA: Diagnosis not present

## 2021-08-12 DIAGNOSIS — I10 Essential (primary) hypertension: Secondary | ICD-10-CM | POA: Diagnosis not present

## 2021-08-12 DIAGNOSIS — S51801D Unspecified open wound of right forearm, subsequent encounter: Secondary | ICD-10-CM | POA: Diagnosis not present

## 2021-08-12 DIAGNOSIS — D509 Iron deficiency anemia, unspecified: Secondary | ICD-10-CM | POA: Diagnosis not present

## 2021-08-12 DIAGNOSIS — D696 Thrombocytopenia, unspecified: Secondary | ICD-10-CM | POA: Diagnosis not present

## 2021-08-12 DIAGNOSIS — Z950 Presence of cardiac pacemaker: Secondary | ICD-10-CM | POA: Diagnosis not present

## 2021-08-12 DIAGNOSIS — L89152 Pressure ulcer of sacral region, stage 2: Secondary | ICD-10-CM | POA: Diagnosis not present

## 2021-08-12 DIAGNOSIS — K5901 Slow transit constipation: Secondary | ICD-10-CM | POA: Diagnosis not present

## 2021-08-12 DIAGNOSIS — R269 Unspecified abnormalities of gait and mobility: Secondary | ICD-10-CM | POA: Diagnosis not present

## 2021-08-12 DIAGNOSIS — E1151 Type 2 diabetes mellitus with diabetic peripheral angiopathy without gangrene: Secondary | ICD-10-CM | POA: Diagnosis not present

## 2021-08-12 DIAGNOSIS — I459 Conduction disorder, unspecified: Secondary | ICD-10-CM | POA: Diagnosis not present

## 2021-08-13 DIAGNOSIS — I480 Paroxysmal atrial fibrillation: Secondary | ICD-10-CM | POA: Diagnosis not present

## 2021-08-13 DIAGNOSIS — I2510 Atherosclerotic heart disease of native coronary artery without angina pectoris: Secondary | ICD-10-CM | POA: Diagnosis not present

## 2021-08-13 DIAGNOSIS — I10 Essential (primary) hypertension: Secondary | ICD-10-CM | POA: Diagnosis not present

## 2021-08-13 DIAGNOSIS — R269 Unspecified abnormalities of gait and mobility: Secondary | ICD-10-CM | POA: Diagnosis not present

## 2021-08-13 DIAGNOSIS — Z9181 History of falling: Secondary | ICD-10-CM | POA: Diagnosis not present

## 2021-08-14 DIAGNOSIS — Z87891 Personal history of nicotine dependence: Secondary | ICD-10-CM | POA: Diagnosis not present

## 2021-08-14 DIAGNOSIS — D509 Iron deficiency anemia, unspecified: Secondary | ICD-10-CM | POA: Diagnosis not present

## 2021-08-14 DIAGNOSIS — D696 Thrombocytopenia, unspecified: Secondary | ICD-10-CM | POA: Diagnosis not present

## 2021-08-14 DIAGNOSIS — I10 Essential (primary) hypertension: Secondary | ICD-10-CM | POA: Diagnosis not present

## 2021-08-14 DIAGNOSIS — Z9181 History of falling: Secondary | ICD-10-CM | POA: Diagnosis not present

## 2021-08-14 DIAGNOSIS — I459 Conduction disorder, unspecified: Secondary | ICD-10-CM | POA: Diagnosis not present

## 2021-08-14 DIAGNOSIS — I2510 Atherosclerotic heart disease of native coronary artery without angina pectoris: Secondary | ICD-10-CM | POA: Diagnosis not present

## 2021-08-14 DIAGNOSIS — Z7901 Long term (current) use of anticoagulants: Secondary | ICD-10-CM | POA: Diagnosis not present

## 2021-08-14 DIAGNOSIS — S51801D Unspecified open wound of right forearm, subsequent encounter: Secondary | ICD-10-CM | POA: Diagnosis not present

## 2021-08-14 DIAGNOSIS — K5901 Slow transit constipation: Secondary | ICD-10-CM | POA: Diagnosis not present

## 2021-08-14 DIAGNOSIS — I480 Paroxysmal atrial fibrillation: Secondary | ICD-10-CM | POA: Diagnosis not present

## 2021-08-14 DIAGNOSIS — E1151 Type 2 diabetes mellitus with diabetic peripheral angiopathy without gangrene: Secondary | ICD-10-CM | POA: Diagnosis not present

## 2021-08-14 DIAGNOSIS — L89152 Pressure ulcer of sacral region, stage 2: Secondary | ICD-10-CM | POA: Diagnosis not present

## 2021-08-14 DIAGNOSIS — L309 Dermatitis, unspecified: Secondary | ICD-10-CM | POA: Diagnosis not present

## 2021-08-14 DIAGNOSIS — R269 Unspecified abnormalities of gait and mobility: Secondary | ICD-10-CM | POA: Diagnosis not present

## 2021-08-14 DIAGNOSIS — Z950 Presence of cardiac pacemaker: Secondary | ICD-10-CM | POA: Diagnosis not present

## 2021-08-14 DIAGNOSIS — E7800 Pure hypercholesterolemia, unspecified: Secondary | ICD-10-CM | POA: Diagnosis not present

## 2021-08-14 DIAGNOSIS — Z955 Presence of coronary angioplasty implant and graft: Secondary | ICD-10-CM | POA: Diagnosis not present

## 2021-08-15 DIAGNOSIS — Z9181 History of falling: Secondary | ICD-10-CM | POA: Diagnosis not present

## 2021-08-15 DIAGNOSIS — L309 Dermatitis, unspecified: Secondary | ICD-10-CM | POA: Diagnosis not present

## 2021-08-15 DIAGNOSIS — Z87891 Personal history of nicotine dependence: Secondary | ICD-10-CM | POA: Diagnosis not present

## 2021-08-15 DIAGNOSIS — I480 Paroxysmal atrial fibrillation: Secondary | ICD-10-CM | POA: Diagnosis not present

## 2021-08-15 DIAGNOSIS — R269 Unspecified abnormalities of gait and mobility: Secondary | ICD-10-CM | POA: Diagnosis not present

## 2021-08-15 DIAGNOSIS — Z950 Presence of cardiac pacemaker: Secondary | ICD-10-CM | POA: Diagnosis not present

## 2021-08-15 DIAGNOSIS — E1151 Type 2 diabetes mellitus with diabetic peripheral angiopathy without gangrene: Secondary | ICD-10-CM | POA: Diagnosis not present

## 2021-08-15 DIAGNOSIS — K5901 Slow transit constipation: Secondary | ICD-10-CM | POA: Diagnosis not present

## 2021-08-15 DIAGNOSIS — E7800 Pure hypercholesterolemia, unspecified: Secondary | ICD-10-CM | POA: Diagnosis not present

## 2021-08-15 DIAGNOSIS — S51801D Unspecified open wound of right forearm, subsequent encounter: Secondary | ICD-10-CM | POA: Diagnosis not present

## 2021-08-15 DIAGNOSIS — I2510 Atherosclerotic heart disease of native coronary artery without angina pectoris: Secondary | ICD-10-CM | POA: Diagnosis not present

## 2021-08-15 DIAGNOSIS — I10 Essential (primary) hypertension: Secondary | ICD-10-CM | POA: Diagnosis not present

## 2021-08-15 DIAGNOSIS — L89152 Pressure ulcer of sacral region, stage 2: Secondary | ICD-10-CM | POA: Diagnosis not present

## 2021-08-15 DIAGNOSIS — I459 Conduction disorder, unspecified: Secondary | ICD-10-CM | POA: Diagnosis not present

## 2021-08-15 DIAGNOSIS — Z955 Presence of coronary angioplasty implant and graft: Secondary | ICD-10-CM | POA: Diagnosis not present

## 2021-08-15 DIAGNOSIS — Z7901 Long term (current) use of anticoagulants: Secondary | ICD-10-CM | POA: Diagnosis not present

## 2021-08-15 DIAGNOSIS — D509 Iron deficiency anemia, unspecified: Secondary | ICD-10-CM | POA: Diagnosis not present

## 2021-08-15 DIAGNOSIS — D696 Thrombocytopenia, unspecified: Secondary | ICD-10-CM | POA: Diagnosis not present

## 2021-08-18 DIAGNOSIS — E7800 Pure hypercholesterolemia, unspecified: Secondary | ICD-10-CM | POA: Diagnosis not present

## 2021-08-18 DIAGNOSIS — D509 Iron deficiency anemia, unspecified: Secondary | ICD-10-CM | POA: Diagnosis not present

## 2021-08-18 DIAGNOSIS — D696 Thrombocytopenia, unspecified: Secondary | ICD-10-CM | POA: Diagnosis not present

## 2021-08-18 DIAGNOSIS — E1151 Type 2 diabetes mellitus with diabetic peripheral angiopathy without gangrene: Secondary | ICD-10-CM | POA: Diagnosis not present

## 2021-08-18 DIAGNOSIS — Z87891 Personal history of nicotine dependence: Secondary | ICD-10-CM | POA: Diagnosis not present

## 2021-08-18 DIAGNOSIS — Z9181 History of falling: Secondary | ICD-10-CM | POA: Diagnosis not present

## 2021-08-18 DIAGNOSIS — I10 Essential (primary) hypertension: Secondary | ICD-10-CM | POA: Diagnosis not present

## 2021-08-18 DIAGNOSIS — I480 Paroxysmal atrial fibrillation: Secondary | ICD-10-CM | POA: Diagnosis not present

## 2021-08-18 DIAGNOSIS — I459 Conduction disorder, unspecified: Secondary | ICD-10-CM | POA: Diagnosis not present

## 2021-08-18 DIAGNOSIS — L89152 Pressure ulcer of sacral region, stage 2: Secondary | ICD-10-CM | POA: Diagnosis not present

## 2021-08-18 DIAGNOSIS — R269 Unspecified abnormalities of gait and mobility: Secondary | ICD-10-CM | POA: Diagnosis not present

## 2021-08-18 DIAGNOSIS — S51801D Unspecified open wound of right forearm, subsequent encounter: Secondary | ICD-10-CM | POA: Diagnosis not present

## 2021-08-18 DIAGNOSIS — K5901 Slow transit constipation: Secondary | ICD-10-CM | POA: Diagnosis not present

## 2021-08-18 DIAGNOSIS — I2510 Atherosclerotic heart disease of native coronary artery without angina pectoris: Secondary | ICD-10-CM | POA: Diagnosis not present

## 2021-08-18 DIAGNOSIS — Z950 Presence of cardiac pacemaker: Secondary | ICD-10-CM | POA: Diagnosis not present

## 2021-08-18 DIAGNOSIS — L309 Dermatitis, unspecified: Secondary | ICD-10-CM | POA: Diagnosis not present

## 2021-08-18 DIAGNOSIS — Z7901 Long term (current) use of anticoagulants: Secondary | ICD-10-CM | POA: Diagnosis not present

## 2021-08-18 DIAGNOSIS — Z955 Presence of coronary angioplasty implant and graft: Secondary | ICD-10-CM | POA: Diagnosis not present

## 2021-08-21 DIAGNOSIS — K5901 Slow transit constipation: Secondary | ICD-10-CM | POA: Diagnosis not present

## 2021-08-21 DIAGNOSIS — I459 Conduction disorder, unspecified: Secondary | ICD-10-CM | POA: Diagnosis not present

## 2021-08-21 DIAGNOSIS — D696 Thrombocytopenia, unspecified: Secondary | ICD-10-CM | POA: Diagnosis not present

## 2021-08-21 DIAGNOSIS — I10 Essential (primary) hypertension: Secondary | ICD-10-CM | POA: Diagnosis not present

## 2021-08-21 DIAGNOSIS — E1151 Type 2 diabetes mellitus with diabetic peripheral angiopathy without gangrene: Secondary | ICD-10-CM | POA: Diagnosis not present

## 2021-08-21 DIAGNOSIS — L89152 Pressure ulcer of sacral region, stage 2: Secondary | ICD-10-CM | POA: Diagnosis not present

## 2021-08-21 DIAGNOSIS — Z955 Presence of coronary angioplasty implant and graft: Secondary | ICD-10-CM | POA: Diagnosis not present

## 2021-08-21 DIAGNOSIS — E7800 Pure hypercholesterolemia, unspecified: Secondary | ICD-10-CM | POA: Diagnosis not present

## 2021-08-21 DIAGNOSIS — R269 Unspecified abnormalities of gait and mobility: Secondary | ICD-10-CM | POA: Diagnosis not present

## 2021-08-21 DIAGNOSIS — I2510 Atherosclerotic heart disease of native coronary artery without angina pectoris: Secondary | ICD-10-CM | POA: Diagnosis not present

## 2021-08-21 DIAGNOSIS — Z7901 Long term (current) use of anticoagulants: Secondary | ICD-10-CM | POA: Diagnosis not present

## 2021-08-21 DIAGNOSIS — Z87891 Personal history of nicotine dependence: Secondary | ICD-10-CM | POA: Diagnosis not present

## 2021-08-21 DIAGNOSIS — S51801D Unspecified open wound of right forearm, subsequent encounter: Secondary | ICD-10-CM | POA: Diagnosis not present

## 2021-08-21 DIAGNOSIS — I480 Paroxysmal atrial fibrillation: Secondary | ICD-10-CM | POA: Diagnosis not present

## 2021-08-21 DIAGNOSIS — Z950 Presence of cardiac pacemaker: Secondary | ICD-10-CM | POA: Diagnosis not present

## 2021-08-21 DIAGNOSIS — L309 Dermatitis, unspecified: Secondary | ICD-10-CM | POA: Diagnosis not present

## 2021-08-21 DIAGNOSIS — Z9181 History of falling: Secondary | ICD-10-CM | POA: Diagnosis not present

## 2021-08-21 DIAGNOSIS — D509 Iron deficiency anemia, unspecified: Secondary | ICD-10-CM | POA: Diagnosis not present

## 2021-08-22 DIAGNOSIS — I459 Conduction disorder, unspecified: Secondary | ICD-10-CM | POA: Diagnosis not present

## 2021-08-22 DIAGNOSIS — D509 Iron deficiency anemia, unspecified: Secondary | ICD-10-CM | POA: Diagnosis not present

## 2021-08-22 DIAGNOSIS — D696 Thrombocytopenia, unspecified: Secondary | ICD-10-CM | POA: Diagnosis not present

## 2021-08-22 DIAGNOSIS — E7800 Pure hypercholesterolemia, unspecified: Secondary | ICD-10-CM | POA: Diagnosis not present

## 2021-08-22 DIAGNOSIS — S51801D Unspecified open wound of right forearm, subsequent encounter: Secondary | ICD-10-CM | POA: Diagnosis not present

## 2021-08-22 DIAGNOSIS — I2510 Atherosclerotic heart disease of native coronary artery without angina pectoris: Secondary | ICD-10-CM | POA: Diagnosis not present

## 2021-08-22 DIAGNOSIS — L89152 Pressure ulcer of sacral region, stage 2: Secondary | ICD-10-CM | POA: Diagnosis not present

## 2021-08-22 DIAGNOSIS — R269 Unspecified abnormalities of gait and mobility: Secondary | ICD-10-CM | POA: Diagnosis not present

## 2021-08-22 DIAGNOSIS — E1151 Type 2 diabetes mellitus with diabetic peripheral angiopathy without gangrene: Secondary | ICD-10-CM | POA: Diagnosis not present

## 2021-08-22 DIAGNOSIS — I10 Essential (primary) hypertension: Secondary | ICD-10-CM | POA: Diagnosis not present

## 2021-08-22 DIAGNOSIS — Z9181 History of falling: Secondary | ICD-10-CM | POA: Diagnosis not present

## 2021-08-22 DIAGNOSIS — L309 Dermatitis, unspecified: Secondary | ICD-10-CM | POA: Diagnosis not present

## 2021-08-22 DIAGNOSIS — Z7901 Long term (current) use of anticoagulants: Secondary | ICD-10-CM | POA: Diagnosis not present

## 2021-08-22 DIAGNOSIS — Z955 Presence of coronary angioplasty implant and graft: Secondary | ICD-10-CM | POA: Diagnosis not present

## 2021-08-22 DIAGNOSIS — K5901 Slow transit constipation: Secondary | ICD-10-CM | POA: Diagnosis not present

## 2021-08-22 DIAGNOSIS — Z87891 Personal history of nicotine dependence: Secondary | ICD-10-CM | POA: Diagnosis not present

## 2021-08-22 DIAGNOSIS — Z950 Presence of cardiac pacemaker: Secondary | ICD-10-CM | POA: Diagnosis not present

## 2021-08-22 DIAGNOSIS — I480 Paroxysmal atrial fibrillation: Secondary | ICD-10-CM | POA: Diagnosis not present

## 2021-08-25 DIAGNOSIS — Z950 Presence of cardiac pacemaker: Secondary | ICD-10-CM | POA: Diagnosis not present

## 2021-08-25 DIAGNOSIS — I10 Essential (primary) hypertension: Secondary | ICD-10-CM | POA: Diagnosis not present

## 2021-08-25 DIAGNOSIS — D509 Iron deficiency anemia, unspecified: Secondary | ICD-10-CM | POA: Diagnosis not present

## 2021-08-25 DIAGNOSIS — Z87891 Personal history of nicotine dependence: Secondary | ICD-10-CM | POA: Diagnosis not present

## 2021-08-25 DIAGNOSIS — L309 Dermatitis, unspecified: Secondary | ICD-10-CM | POA: Diagnosis not present

## 2021-08-25 DIAGNOSIS — Z955 Presence of coronary angioplasty implant and graft: Secondary | ICD-10-CM | POA: Diagnosis not present

## 2021-08-25 DIAGNOSIS — I480 Paroxysmal atrial fibrillation: Secondary | ICD-10-CM | POA: Diagnosis not present

## 2021-08-25 DIAGNOSIS — E1151 Type 2 diabetes mellitus with diabetic peripheral angiopathy without gangrene: Secondary | ICD-10-CM | POA: Diagnosis not present

## 2021-08-25 DIAGNOSIS — D696 Thrombocytopenia, unspecified: Secondary | ICD-10-CM | POA: Diagnosis not present

## 2021-08-25 DIAGNOSIS — K5901 Slow transit constipation: Secondary | ICD-10-CM | POA: Diagnosis not present

## 2021-08-25 DIAGNOSIS — S51812A Laceration without foreign body of left forearm, initial encounter: Secondary | ICD-10-CM | POA: Diagnosis not present

## 2021-08-25 DIAGNOSIS — L89152 Pressure ulcer of sacral region, stage 2: Secondary | ICD-10-CM | POA: Diagnosis not present

## 2021-08-25 DIAGNOSIS — Z7901 Long term (current) use of anticoagulants: Secondary | ICD-10-CM | POA: Diagnosis not present

## 2021-08-25 DIAGNOSIS — R269 Unspecified abnormalities of gait and mobility: Secondary | ICD-10-CM | POA: Diagnosis not present

## 2021-08-25 DIAGNOSIS — S51811A Laceration without foreign body of right forearm, initial encounter: Secondary | ICD-10-CM | POA: Diagnosis not present

## 2021-08-25 DIAGNOSIS — I459 Conduction disorder, unspecified: Secondary | ICD-10-CM | POA: Diagnosis not present

## 2021-08-25 DIAGNOSIS — E7800 Pure hypercholesterolemia, unspecified: Secondary | ICD-10-CM | POA: Diagnosis not present

## 2021-08-25 DIAGNOSIS — I2510 Atherosclerotic heart disease of native coronary artery without angina pectoris: Secondary | ICD-10-CM | POA: Diagnosis not present

## 2021-08-25 DIAGNOSIS — S51801D Unspecified open wound of right forearm, subsequent encounter: Secondary | ICD-10-CM | POA: Diagnosis not present

## 2021-08-25 DIAGNOSIS — Z9181 History of falling: Secondary | ICD-10-CM | POA: Diagnosis not present

## 2021-08-26 DIAGNOSIS — L309 Dermatitis, unspecified: Secondary | ICD-10-CM | POA: Diagnosis not present

## 2021-08-26 DIAGNOSIS — Z9181 History of falling: Secondary | ICD-10-CM | POA: Diagnosis not present

## 2021-08-26 DIAGNOSIS — E7800 Pure hypercholesterolemia, unspecified: Secondary | ICD-10-CM | POA: Diagnosis not present

## 2021-08-26 DIAGNOSIS — I2510 Atherosclerotic heart disease of native coronary artery without angina pectoris: Secondary | ICD-10-CM | POA: Diagnosis not present

## 2021-08-26 DIAGNOSIS — I480 Paroxysmal atrial fibrillation: Secondary | ICD-10-CM | POA: Diagnosis not present

## 2021-08-26 DIAGNOSIS — Z7901 Long term (current) use of anticoagulants: Secondary | ICD-10-CM | POA: Diagnosis not present

## 2021-08-26 DIAGNOSIS — S51801D Unspecified open wound of right forearm, subsequent encounter: Secondary | ICD-10-CM | POA: Diagnosis not present

## 2021-08-26 DIAGNOSIS — Z87891 Personal history of nicotine dependence: Secondary | ICD-10-CM | POA: Diagnosis not present

## 2021-08-26 DIAGNOSIS — Z955 Presence of coronary angioplasty implant and graft: Secondary | ICD-10-CM | POA: Diagnosis not present

## 2021-08-26 DIAGNOSIS — I459 Conduction disorder, unspecified: Secondary | ICD-10-CM | POA: Diagnosis not present

## 2021-08-26 DIAGNOSIS — D509 Iron deficiency anemia, unspecified: Secondary | ICD-10-CM | POA: Diagnosis not present

## 2021-08-26 DIAGNOSIS — E1151 Type 2 diabetes mellitus with diabetic peripheral angiopathy without gangrene: Secondary | ICD-10-CM | POA: Diagnosis not present

## 2021-08-26 DIAGNOSIS — R269 Unspecified abnormalities of gait and mobility: Secondary | ICD-10-CM | POA: Diagnosis not present

## 2021-08-26 DIAGNOSIS — L89152 Pressure ulcer of sacral region, stage 2: Secondary | ICD-10-CM | POA: Diagnosis not present

## 2021-08-26 DIAGNOSIS — Z950 Presence of cardiac pacemaker: Secondary | ICD-10-CM | POA: Diagnosis not present

## 2021-08-26 DIAGNOSIS — K5901 Slow transit constipation: Secondary | ICD-10-CM | POA: Diagnosis not present

## 2021-08-26 DIAGNOSIS — D696 Thrombocytopenia, unspecified: Secondary | ICD-10-CM | POA: Diagnosis not present

## 2021-08-26 DIAGNOSIS — I10 Essential (primary) hypertension: Secondary | ICD-10-CM | POA: Diagnosis not present

## 2021-08-27 ENCOUNTER — Ambulatory Visit (INDEPENDENT_AMBULATORY_CARE_PROVIDER_SITE_OTHER): Payer: Medicare Other

## 2021-08-27 DIAGNOSIS — I495 Sick sinus syndrome: Secondary | ICD-10-CM | POA: Diagnosis not present

## 2021-08-27 LAB — CUP PACEART REMOTE DEVICE CHECK
Battery Remaining Longevity: 36 mo
Battery Voltage: 2.97 V
Brady Statistic AP VP Percent: 84.31 %
Brady Statistic AP VS Percent: 0.06 %
Brady Statistic AS VP Percent: 15.6 %
Brady Statistic AS VS Percent: 0.03 %
Brady Statistic RA Percent Paced: 82.91 %
Brady Statistic RV Percent Paced: 99.87 %
Date Time Interrogation Session: 20221221180115
Implantable Lead Implant Date: 20161216
Implantable Lead Implant Date: 20161216
Implantable Lead Location: 753859
Implantable Lead Location: 753860
Implantable Lead Model: 5076
Implantable Lead Model: 5076
Implantable Pulse Generator Implant Date: 20161216
Lead Channel Impedance Value: 342 Ohm
Lead Channel Impedance Value: 361 Ohm
Lead Channel Impedance Value: 418 Ohm
Lead Channel Impedance Value: 456 Ohm
Lead Channel Pacing Threshold Amplitude: 0.5 V
Lead Channel Pacing Threshold Amplitude: 0.625 V
Lead Channel Pacing Threshold Pulse Width: 0.4 ms
Lead Channel Pacing Threshold Pulse Width: 0.4 ms
Lead Channel Sensing Intrinsic Amplitude: 1.25 mV
Lead Channel Sensing Intrinsic Amplitude: 1.25 mV
Lead Channel Sensing Intrinsic Amplitude: 5.375 mV
Lead Channel Sensing Intrinsic Amplitude: 5.375 mV
Lead Channel Setting Pacing Amplitude: 2 V
Lead Channel Setting Pacing Amplitude: 2.5 V
Lead Channel Setting Pacing Pulse Width: 0.4 ms
Lead Channel Setting Sensing Sensitivity: 2 mV

## 2021-08-29 DIAGNOSIS — E1151 Type 2 diabetes mellitus with diabetic peripheral angiopathy without gangrene: Secondary | ICD-10-CM | POA: Diagnosis not present

## 2021-08-29 DIAGNOSIS — D509 Iron deficiency anemia, unspecified: Secondary | ICD-10-CM | POA: Diagnosis not present

## 2021-08-29 DIAGNOSIS — S51801D Unspecified open wound of right forearm, subsequent encounter: Secondary | ICD-10-CM | POA: Diagnosis not present

## 2021-08-29 DIAGNOSIS — D696 Thrombocytopenia, unspecified: Secondary | ICD-10-CM | POA: Diagnosis not present

## 2021-08-29 DIAGNOSIS — K5901 Slow transit constipation: Secondary | ICD-10-CM | POA: Diagnosis not present

## 2021-08-29 DIAGNOSIS — Z7901 Long term (current) use of anticoagulants: Secondary | ICD-10-CM | POA: Diagnosis not present

## 2021-08-29 DIAGNOSIS — E7800 Pure hypercholesterolemia, unspecified: Secondary | ICD-10-CM | POA: Diagnosis not present

## 2021-08-29 DIAGNOSIS — L89152 Pressure ulcer of sacral region, stage 2: Secondary | ICD-10-CM | POA: Diagnosis not present

## 2021-08-29 DIAGNOSIS — Z950 Presence of cardiac pacemaker: Secondary | ICD-10-CM | POA: Diagnosis not present

## 2021-08-29 DIAGNOSIS — Z9181 History of falling: Secondary | ICD-10-CM | POA: Diagnosis not present

## 2021-08-29 DIAGNOSIS — I10 Essential (primary) hypertension: Secondary | ICD-10-CM | POA: Diagnosis not present

## 2021-08-29 DIAGNOSIS — I2510 Atherosclerotic heart disease of native coronary artery without angina pectoris: Secondary | ICD-10-CM | POA: Diagnosis not present

## 2021-08-29 DIAGNOSIS — I480 Paroxysmal atrial fibrillation: Secondary | ICD-10-CM | POA: Diagnosis not present

## 2021-08-29 DIAGNOSIS — Z955 Presence of coronary angioplasty implant and graft: Secondary | ICD-10-CM | POA: Diagnosis not present

## 2021-08-29 DIAGNOSIS — I459 Conduction disorder, unspecified: Secondary | ICD-10-CM | POA: Diagnosis not present

## 2021-08-29 DIAGNOSIS — R269 Unspecified abnormalities of gait and mobility: Secondary | ICD-10-CM | POA: Diagnosis not present

## 2021-08-29 DIAGNOSIS — L309 Dermatitis, unspecified: Secondary | ICD-10-CM | POA: Diagnosis not present

## 2021-08-29 DIAGNOSIS — Z87891 Personal history of nicotine dependence: Secondary | ICD-10-CM | POA: Diagnosis not present

## 2021-09-01 DIAGNOSIS — I2510 Atherosclerotic heart disease of native coronary artery without angina pectoris: Secondary | ICD-10-CM | POA: Diagnosis not present

## 2021-09-01 DIAGNOSIS — L89152 Pressure ulcer of sacral region, stage 2: Secondary | ICD-10-CM | POA: Diagnosis not present

## 2021-09-01 DIAGNOSIS — D696 Thrombocytopenia, unspecified: Secondary | ICD-10-CM | POA: Diagnosis not present

## 2021-09-01 DIAGNOSIS — E7800 Pure hypercholesterolemia, unspecified: Secondary | ICD-10-CM | POA: Diagnosis not present

## 2021-09-01 DIAGNOSIS — I459 Conduction disorder, unspecified: Secondary | ICD-10-CM | POA: Diagnosis not present

## 2021-09-01 DIAGNOSIS — Z87891 Personal history of nicotine dependence: Secondary | ICD-10-CM | POA: Diagnosis not present

## 2021-09-01 DIAGNOSIS — I10 Essential (primary) hypertension: Secondary | ICD-10-CM | POA: Diagnosis not present

## 2021-09-01 DIAGNOSIS — I480 Paroxysmal atrial fibrillation: Secondary | ICD-10-CM | POA: Diagnosis not present

## 2021-09-01 DIAGNOSIS — Z7901 Long term (current) use of anticoagulants: Secondary | ICD-10-CM | POA: Diagnosis not present

## 2021-09-01 DIAGNOSIS — Z9181 History of falling: Secondary | ICD-10-CM | POA: Diagnosis not present

## 2021-09-01 DIAGNOSIS — K5901 Slow transit constipation: Secondary | ICD-10-CM | POA: Diagnosis not present

## 2021-09-01 DIAGNOSIS — Z955 Presence of coronary angioplasty implant and graft: Secondary | ICD-10-CM | POA: Diagnosis not present

## 2021-09-01 DIAGNOSIS — L309 Dermatitis, unspecified: Secondary | ICD-10-CM | POA: Diagnosis not present

## 2021-09-01 DIAGNOSIS — Z950 Presence of cardiac pacemaker: Secondary | ICD-10-CM | POA: Diagnosis not present

## 2021-09-01 DIAGNOSIS — S51801D Unspecified open wound of right forearm, subsequent encounter: Secondary | ICD-10-CM | POA: Diagnosis not present

## 2021-09-01 DIAGNOSIS — R269 Unspecified abnormalities of gait and mobility: Secondary | ICD-10-CM | POA: Diagnosis not present

## 2021-09-01 DIAGNOSIS — E1151 Type 2 diabetes mellitus with diabetic peripheral angiopathy without gangrene: Secondary | ICD-10-CM | POA: Diagnosis not present

## 2021-09-01 DIAGNOSIS — D509 Iron deficiency anemia, unspecified: Secondary | ICD-10-CM | POA: Diagnosis not present

## 2021-09-02 ENCOUNTER — Other Ambulatory Visit: Payer: Self-pay | Admitting: *Deleted

## 2021-09-02 MED ORDER — CARVEDILOL 12.5 MG PO TABS: 12.5000 mg | ORAL_TABLET | Freq: Two times a day (BID) | ORAL | 0 refills | Status: DC

## 2021-09-04 DIAGNOSIS — I480 Paroxysmal atrial fibrillation: Secondary | ICD-10-CM | POA: Diagnosis not present

## 2021-09-04 DIAGNOSIS — K5901 Slow transit constipation: Secondary | ICD-10-CM | POA: Diagnosis not present

## 2021-09-04 DIAGNOSIS — Z9181 History of falling: Secondary | ICD-10-CM | POA: Diagnosis not present

## 2021-09-04 DIAGNOSIS — I2510 Atherosclerotic heart disease of native coronary artery without angina pectoris: Secondary | ICD-10-CM | POA: Diagnosis not present

## 2021-09-04 DIAGNOSIS — R269 Unspecified abnormalities of gait and mobility: Secondary | ICD-10-CM | POA: Diagnosis not present

## 2021-09-04 DIAGNOSIS — E7800 Pure hypercholesterolemia, unspecified: Secondary | ICD-10-CM | POA: Diagnosis not present

## 2021-09-04 DIAGNOSIS — Z87891 Personal history of nicotine dependence: Secondary | ICD-10-CM | POA: Diagnosis not present

## 2021-09-04 DIAGNOSIS — D509 Iron deficiency anemia, unspecified: Secondary | ICD-10-CM | POA: Diagnosis not present

## 2021-09-04 DIAGNOSIS — Z950 Presence of cardiac pacemaker: Secondary | ICD-10-CM | POA: Diagnosis not present

## 2021-09-04 DIAGNOSIS — I10 Essential (primary) hypertension: Secondary | ICD-10-CM | POA: Diagnosis not present

## 2021-09-04 DIAGNOSIS — L89152 Pressure ulcer of sacral region, stage 2: Secondary | ICD-10-CM | POA: Diagnosis not present

## 2021-09-04 DIAGNOSIS — E1151 Type 2 diabetes mellitus with diabetic peripheral angiopathy without gangrene: Secondary | ICD-10-CM | POA: Diagnosis not present

## 2021-09-04 DIAGNOSIS — I459 Conduction disorder, unspecified: Secondary | ICD-10-CM | POA: Diagnosis not present

## 2021-09-04 DIAGNOSIS — Z7901 Long term (current) use of anticoagulants: Secondary | ICD-10-CM | POA: Diagnosis not present

## 2021-09-04 DIAGNOSIS — S51801D Unspecified open wound of right forearm, subsequent encounter: Secondary | ICD-10-CM | POA: Diagnosis not present

## 2021-09-04 DIAGNOSIS — D696 Thrombocytopenia, unspecified: Secondary | ICD-10-CM | POA: Diagnosis not present

## 2021-09-04 DIAGNOSIS — L309 Dermatitis, unspecified: Secondary | ICD-10-CM | POA: Diagnosis not present

## 2021-09-04 DIAGNOSIS — Z955 Presence of coronary angioplasty implant and graft: Secondary | ICD-10-CM | POA: Diagnosis not present

## 2021-09-04 NOTE — Progress Notes (Signed)
Remote pacemaker transmission.   

## 2021-09-05 DIAGNOSIS — Z7901 Long term (current) use of anticoagulants: Secondary | ICD-10-CM | POA: Diagnosis not present

## 2021-09-05 DIAGNOSIS — I2510 Atherosclerotic heart disease of native coronary artery without angina pectoris: Secondary | ICD-10-CM | POA: Diagnosis not present

## 2021-09-05 DIAGNOSIS — L309 Dermatitis, unspecified: Secondary | ICD-10-CM | POA: Diagnosis not present

## 2021-09-05 DIAGNOSIS — E1151 Type 2 diabetes mellitus with diabetic peripheral angiopathy without gangrene: Secondary | ICD-10-CM | POA: Diagnosis not present

## 2021-09-05 DIAGNOSIS — D696 Thrombocytopenia, unspecified: Secondary | ICD-10-CM | POA: Diagnosis not present

## 2021-09-05 DIAGNOSIS — L89152 Pressure ulcer of sacral region, stage 2: Secondary | ICD-10-CM | POA: Diagnosis not present

## 2021-09-05 DIAGNOSIS — E7800 Pure hypercholesterolemia, unspecified: Secondary | ICD-10-CM | POA: Diagnosis not present

## 2021-09-05 DIAGNOSIS — K5901 Slow transit constipation: Secondary | ICD-10-CM | POA: Diagnosis not present

## 2021-09-05 DIAGNOSIS — R269 Unspecified abnormalities of gait and mobility: Secondary | ICD-10-CM | POA: Diagnosis not present

## 2021-09-05 DIAGNOSIS — I10 Essential (primary) hypertension: Secondary | ICD-10-CM | POA: Diagnosis not present

## 2021-09-05 DIAGNOSIS — D509 Iron deficiency anemia, unspecified: Secondary | ICD-10-CM | POA: Diagnosis not present

## 2021-09-05 DIAGNOSIS — Z955 Presence of coronary angioplasty implant and graft: Secondary | ICD-10-CM | POA: Diagnosis not present

## 2021-09-05 DIAGNOSIS — Z9181 History of falling: Secondary | ICD-10-CM | POA: Diagnosis not present

## 2021-09-05 DIAGNOSIS — Z950 Presence of cardiac pacemaker: Secondary | ICD-10-CM | POA: Diagnosis not present

## 2021-09-05 DIAGNOSIS — Z87891 Personal history of nicotine dependence: Secondary | ICD-10-CM | POA: Diagnosis not present

## 2021-09-05 DIAGNOSIS — I480 Paroxysmal atrial fibrillation: Secondary | ICD-10-CM | POA: Diagnosis not present

## 2021-09-05 DIAGNOSIS — S51801D Unspecified open wound of right forearm, subsequent encounter: Secondary | ICD-10-CM | POA: Diagnosis not present

## 2021-09-05 DIAGNOSIS — I459 Conduction disorder, unspecified: Secondary | ICD-10-CM | POA: Diagnosis not present

## 2021-09-08 DIAGNOSIS — L309 Dermatitis, unspecified: Secondary | ICD-10-CM | POA: Diagnosis not present

## 2021-09-08 DIAGNOSIS — E7800 Pure hypercholesterolemia, unspecified: Secondary | ICD-10-CM | POA: Diagnosis not present

## 2021-09-08 DIAGNOSIS — Z950 Presence of cardiac pacemaker: Secondary | ICD-10-CM | POA: Diagnosis not present

## 2021-09-08 DIAGNOSIS — E1151 Type 2 diabetes mellitus with diabetic peripheral angiopathy without gangrene: Secondary | ICD-10-CM | POA: Diagnosis not present

## 2021-09-08 DIAGNOSIS — Z9181 History of falling: Secondary | ICD-10-CM | POA: Diagnosis not present

## 2021-09-08 DIAGNOSIS — K5901 Slow transit constipation: Secondary | ICD-10-CM | POA: Diagnosis not present

## 2021-09-08 DIAGNOSIS — I480 Paroxysmal atrial fibrillation: Secondary | ICD-10-CM | POA: Diagnosis not present

## 2021-09-08 DIAGNOSIS — Z7901 Long term (current) use of anticoagulants: Secondary | ICD-10-CM | POA: Diagnosis not present

## 2021-09-08 DIAGNOSIS — D509 Iron deficiency anemia, unspecified: Secondary | ICD-10-CM | POA: Diagnosis not present

## 2021-09-08 DIAGNOSIS — Z955 Presence of coronary angioplasty implant and graft: Secondary | ICD-10-CM | POA: Diagnosis not present

## 2021-09-08 DIAGNOSIS — I459 Conduction disorder, unspecified: Secondary | ICD-10-CM | POA: Diagnosis not present

## 2021-09-08 DIAGNOSIS — S51801D Unspecified open wound of right forearm, subsequent encounter: Secondary | ICD-10-CM | POA: Diagnosis not present

## 2021-09-08 DIAGNOSIS — Z87891 Personal history of nicotine dependence: Secondary | ICD-10-CM | POA: Diagnosis not present

## 2021-09-08 DIAGNOSIS — I2510 Atherosclerotic heart disease of native coronary artery without angina pectoris: Secondary | ICD-10-CM | POA: Diagnosis not present

## 2021-09-08 DIAGNOSIS — I10 Essential (primary) hypertension: Secondary | ICD-10-CM | POA: Diagnosis not present

## 2021-09-08 DIAGNOSIS — L89152 Pressure ulcer of sacral region, stage 2: Secondary | ICD-10-CM | POA: Diagnosis not present

## 2021-09-08 DIAGNOSIS — R269 Unspecified abnormalities of gait and mobility: Secondary | ICD-10-CM | POA: Diagnosis not present

## 2021-09-08 DIAGNOSIS — D696 Thrombocytopenia, unspecified: Secondary | ICD-10-CM | POA: Diagnosis not present

## 2021-09-12 DIAGNOSIS — I10 Essential (primary) hypertension: Secondary | ICD-10-CM | POA: Diagnosis not present

## 2021-09-12 DIAGNOSIS — I480 Paroxysmal atrial fibrillation: Secondary | ICD-10-CM | POA: Diagnosis not present

## 2021-09-12 DIAGNOSIS — R269 Unspecified abnormalities of gait and mobility: Secondary | ICD-10-CM | POA: Diagnosis not present

## 2021-09-12 DIAGNOSIS — Z9181 History of falling: Secondary | ICD-10-CM | POA: Diagnosis not present

## 2021-09-12 DIAGNOSIS — Z955 Presence of coronary angioplasty implant and graft: Secondary | ICD-10-CM | POA: Diagnosis not present

## 2021-09-12 DIAGNOSIS — E7800 Pure hypercholesterolemia, unspecified: Secondary | ICD-10-CM | POA: Diagnosis not present

## 2021-09-12 DIAGNOSIS — D696 Thrombocytopenia, unspecified: Secondary | ICD-10-CM | POA: Diagnosis not present

## 2021-09-12 DIAGNOSIS — S51801D Unspecified open wound of right forearm, subsequent encounter: Secondary | ICD-10-CM | POA: Diagnosis not present

## 2021-09-12 DIAGNOSIS — E1151 Type 2 diabetes mellitus with diabetic peripheral angiopathy without gangrene: Secondary | ICD-10-CM | POA: Diagnosis not present

## 2021-09-12 DIAGNOSIS — I459 Conduction disorder, unspecified: Secondary | ICD-10-CM | POA: Diagnosis not present

## 2021-09-12 DIAGNOSIS — Z87891 Personal history of nicotine dependence: Secondary | ICD-10-CM | POA: Diagnosis not present

## 2021-09-12 DIAGNOSIS — D509 Iron deficiency anemia, unspecified: Secondary | ICD-10-CM | POA: Diagnosis not present

## 2021-09-12 DIAGNOSIS — I2510 Atherosclerotic heart disease of native coronary artery without angina pectoris: Secondary | ICD-10-CM | POA: Diagnosis not present

## 2021-09-12 DIAGNOSIS — L89152 Pressure ulcer of sacral region, stage 2: Secondary | ICD-10-CM | POA: Diagnosis not present

## 2021-09-12 DIAGNOSIS — L309 Dermatitis, unspecified: Secondary | ICD-10-CM | POA: Diagnosis not present

## 2021-09-12 DIAGNOSIS — K5901 Slow transit constipation: Secondary | ICD-10-CM | POA: Diagnosis not present

## 2021-09-12 DIAGNOSIS — Z7901 Long term (current) use of anticoagulants: Secondary | ICD-10-CM | POA: Diagnosis not present

## 2021-09-12 DIAGNOSIS — Z950 Presence of cardiac pacemaker: Secondary | ICD-10-CM | POA: Diagnosis not present

## 2021-09-15 DIAGNOSIS — Z9181 History of falling: Secondary | ICD-10-CM | POA: Diagnosis not present

## 2021-09-15 DIAGNOSIS — E1151 Type 2 diabetes mellitus with diabetic peripheral angiopathy without gangrene: Secondary | ICD-10-CM | POA: Diagnosis not present

## 2021-09-15 DIAGNOSIS — Z7901 Long term (current) use of anticoagulants: Secondary | ICD-10-CM | POA: Diagnosis not present

## 2021-09-15 DIAGNOSIS — I2510 Atherosclerotic heart disease of native coronary artery without angina pectoris: Secondary | ICD-10-CM | POA: Diagnosis not present

## 2021-09-15 DIAGNOSIS — Z950 Presence of cardiac pacemaker: Secondary | ICD-10-CM | POA: Diagnosis not present

## 2021-09-15 DIAGNOSIS — S51801D Unspecified open wound of right forearm, subsequent encounter: Secondary | ICD-10-CM | POA: Diagnosis not present

## 2021-09-15 DIAGNOSIS — D696 Thrombocytopenia, unspecified: Secondary | ICD-10-CM | POA: Diagnosis not present

## 2021-09-15 DIAGNOSIS — E7800 Pure hypercholesterolemia, unspecified: Secondary | ICD-10-CM | POA: Diagnosis not present

## 2021-09-15 DIAGNOSIS — I480 Paroxysmal atrial fibrillation: Secondary | ICD-10-CM | POA: Diagnosis not present

## 2021-09-15 DIAGNOSIS — D509 Iron deficiency anemia, unspecified: Secondary | ICD-10-CM | POA: Diagnosis not present

## 2021-09-15 DIAGNOSIS — Z955 Presence of coronary angioplasty implant and graft: Secondary | ICD-10-CM | POA: Diagnosis not present

## 2021-09-15 DIAGNOSIS — I10 Essential (primary) hypertension: Secondary | ICD-10-CM | POA: Diagnosis not present

## 2021-09-15 DIAGNOSIS — K5901 Slow transit constipation: Secondary | ICD-10-CM | POA: Diagnosis not present

## 2021-09-15 DIAGNOSIS — L89152 Pressure ulcer of sacral region, stage 2: Secondary | ICD-10-CM | POA: Diagnosis not present

## 2021-09-15 DIAGNOSIS — Z87891 Personal history of nicotine dependence: Secondary | ICD-10-CM | POA: Diagnosis not present

## 2021-09-15 DIAGNOSIS — R269 Unspecified abnormalities of gait and mobility: Secondary | ICD-10-CM | POA: Diagnosis not present

## 2021-09-15 DIAGNOSIS — I459 Conduction disorder, unspecified: Secondary | ICD-10-CM | POA: Diagnosis not present

## 2021-09-15 DIAGNOSIS — L309 Dermatitis, unspecified: Secondary | ICD-10-CM | POA: Diagnosis not present

## 2021-09-16 DIAGNOSIS — Z87891 Personal history of nicotine dependence: Secondary | ICD-10-CM | POA: Diagnosis not present

## 2021-09-16 DIAGNOSIS — I459 Conduction disorder, unspecified: Secondary | ICD-10-CM | POA: Diagnosis not present

## 2021-09-16 DIAGNOSIS — D509 Iron deficiency anemia, unspecified: Secondary | ICD-10-CM | POA: Diagnosis not present

## 2021-09-16 DIAGNOSIS — D696 Thrombocytopenia, unspecified: Secondary | ICD-10-CM | POA: Diagnosis not present

## 2021-09-16 DIAGNOSIS — E1151 Type 2 diabetes mellitus with diabetic peripheral angiopathy without gangrene: Secondary | ICD-10-CM | POA: Diagnosis not present

## 2021-09-16 DIAGNOSIS — Z7901 Long term (current) use of anticoagulants: Secondary | ICD-10-CM | POA: Diagnosis not present

## 2021-09-16 DIAGNOSIS — S51801D Unspecified open wound of right forearm, subsequent encounter: Secondary | ICD-10-CM | POA: Diagnosis not present

## 2021-09-16 DIAGNOSIS — L309 Dermatitis, unspecified: Secondary | ICD-10-CM | POA: Diagnosis not present

## 2021-09-16 DIAGNOSIS — E7800 Pure hypercholesterolemia, unspecified: Secondary | ICD-10-CM | POA: Diagnosis not present

## 2021-09-16 DIAGNOSIS — Z955 Presence of coronary angioplasty implant and graft: Secondary | ICD-10-CM | POA: Diagnosis not present

## 2021-09-16 DIAGNOSIS — Z9181 History of falling: Secondary | ICD-10-CM | POA: Diagnosis not present

## 2021-09-16 DIAGNOSIS — K5901 Slow transit constipation: Secondary | ICD-10-CM | POA: Diagnosis not present

## 2021-09-16 DIAGNOSIS — R269 Unspecified abnormalities of gait and mobility: Secondary | ICD-10-CM | POA: Diagnosis not present

## 2021-09-16 DIAGNOSIS — I2510 Atherosclerotic heart disease of native coronary artery without angina pectoris: Secondary | ICD-10-CM | POA: Diagnosis not present

## 2021-09-16 DIAGNOSIS — I480 Paroxysmal atrial fibrillation: Secondary | ICD-10-CM | POA: Diagnosis not present

## 2021-09-16 DIAGNOSIS — I10 Essential (primary) hypertension: Secondary | ICD-10-CM | POA: Diagnosis not present

## 2021-09-16 DIAGNOSIS — Z950 Presence of cardiac pacemaker: Secondary | ICD-10-CM | POA: Diagnosis not present

## 2021-09-16 DIAGNOSIS — L89152 Pressure ulcer of sacral region, stage 2: Secondary | ICD-10-CM | POA: Diagnosis not present

## 2021-09-17 DIAGNOSIS — E7800 Pure hypercholesterolemia, unspecified: Secondary | ICD-10-CM | POA: Diagnosis not present

## 2021-09-17 DIAGNOSIS — D696 Thrombocytopenia, unspecified: Secondary | ICD-10-CM | POA: Diagnosis not present

## 2021-09-17 DIAGNOSIS — L309 Dermatitis, unspecified: Secondary | ICD-10-CM | POA: Diagnosis not present

## 2021-09-17 DIAGNOSIS — Z950 Presence of cardiac pacemaker: Secondary | ICD-10-CM | POA: Diagnosis not present

## 2021-09-17 DIAGNOSIS — I10 Essential (primary) hypertension: Secondary | ICD-10-CM | POA: Diagnosis not present

## 2021-09-17 DIAGNOSIS — D509 Iron deficiency anemia, unspecified: Secondary | ICD-10-CM | POA: Diagnosis not present

## 2021-09-17 DIAGNOSIS — Z7901 Long term (current) use of anticoagulants: Secondary | ICD-10-CM | POA: Diagnosis not present

## 2021-09-17 DIAGNOSIS — L89152 Pressure ulcer of sacral region, stage 2: Secondary | ICD-10-CM | POA: Diagnosis not present

## 2021-09-17 DIAGNOSIS — S51801D Unspecified open wound of right forearm, subsequent encounter: Secondary | ICD-10-CM | POA: Diagnosis not present

## 2021-09-17 DIAGNOSIS — Z9181 History of falling: Secondary | ICD-10-CM | POA: Diagnosis not present

## 2021-09-17 DIAGNOSIS — Z87891 Personal history of nicotine dependence: Secondary | ICD-10-CM | POA: Diagnosis not present

## 2021-09-17 DIAGNOSIS — R269 Unspecified abnormalities of gait and mobility: Secondary | ICD-10-CM | POA: Diagnosis not present

## 2021-09-17 DIAGNOSIS — I459 Conduction disorder, unspecified: Secondary | ICD-10-CM | POA: Diagnosis not present

## 2021-09-17 DIAGNOSIS — Z955 Presence of coronary angioplasty implant and graft: Secondary | ICD-10-CM | POA: Diagnosis not present

## 2021-09-17 DIAGNOSIS — I2510 Atherosclerotic heart disease of native coronary artery without angina pectoris: Secondary | ICD-10-CM | POA: Diagnosis not present

## 2021-09-17 DIAGNOSIS — E1151 Type 2 diabetes mellitus with diabetic peripheral angiopathy without gangrene: Secondary | ICD-10-CM | POA: Diagnosis not present

## 2021-09-17 DIAGNOSIS — K5901 Slow transit constipation: Secondary | ICD-10-CM | POA: Diagnosis not present

## 2021-09-17 DIAGNOSIS — I480 Paroxysmal atrial fibrillation: Secondary | ICD-10-CM | POA: Diagnosis not present

## 2021-09-19 DIAGNOSIS — I459 Conduction disorder, unspecified: Secondary | ICD-10-CM | POA: Diagnosis not present

## 2021-09-19 DIAGNOSIS — L89152 Pressure ulcer of sacral region, stage 2: Secondary | ICD-10-CM | POA: Diagnosis not present

## 2021-09-19 DIAGNOSIS — E1151 Type 2 diabetes mellitus with diabetic peripheral angiopathy without gangrene: Secondary | ICD-10-CM | POA: Diagnosis not present

## 2021-09-19 DIAGNOSIS — I10 Essential (primary) hypertension: Secondary | ICD-10-CM | POA: Diagnosis not present

## 2021-09-19 DIAGNOSIS — Z7901 Long term (current) use of anticoagulants: Secondary | ICD-10-CM | POA: Diagnosis not present

## 2021-09-19 DIAGNOSIS — D509 Iron deficiency anemia, unspecified: Secondary | ICD-10-CM | POA: Diagnosis not present

## 2021-09-19 DIAGNOSIS — E7800 Pure hypercholesterolemia, unspecified: Secondary | ICD-10-CM | POA: Diagnosis not present

## 2021-09-19 DIAGNOSIS — I480 Paroxysmal atrial fibrillation: Secondary | ICD-10-CM | POA: Diagnosis not present

## 2021-09-19 DIAGNOSIS — Z955 Presence of coronary angioplasty implant and graft: Secondary | ICD-10-CM | POA: Diagnosis not present

## 2021-09-19 DIAGNOSIS — S51801D Unspecified open wound of right forearm, subsequent encounter: Secondary | ICD-10-CM | POA: Diagnosis not present

## 2021-09-19 DIAGNOSIS — L309 Dermatitis, unspecified: Secondary | ICD-10-CM | POA: Diagnosis not present

## 2021-09-19 DIAGNOSIS — R269 Unspecified abnormalities of gait and mobility: Secondary | ICD-10-CM | POA: Diagnosis not present

## 2021-09-19 DIAGNOSIS — D696 Thrombocytopenia, unspecified: Secondary | ICD-10-CM | POA: Diagnosis not present

## 2021-09-19 DIAGNOSIS — I2510 Atherosclerotic heart disease of native coronary artery without angina pectoris: Secondary | ICD-10-CM | POA: Diagnosis not present

## 2021-09-19 DIAGNOSIS — Z950 Presence of cardiac pacemaker: Secondary | ICD-10-CM | POA: Diagnosis not present

## 2021-09-19 DIAGNOSIS — K5901 Slow transit constipation: Secondary | ICD-10-CM | POA: Diagnosis not present

## 2021-09-19 DIAGNOSIS — Z87891 Personal history of nicotine dependence: Secondary | ICD-10-CM | POA: Diagnosis not present

## 2021-09-19 DIAGNOSIS — Z9181 History of falling: Secondary | ICD-10-CM | POA: Diagnosis not present

## 2021-09-22 DIAGNOSIS — E1151 Type 2 diabetes mellitus with diabetic peripheral angiopathy without gangrene: Secondary | ICD-10-CM | POA: Diagnosis not present

## 2021-09-22 DIAGNOSIS — R269 Unspecified abnormalities of gait and mobility: Secondary | ICD-10-CM | POA: Diagnosis not present

## 2021-09-22 DIAGNOSIS — Z955 Presence of coronary angioplasty implant and graft: Secondary | ICD-10-CM | POA: Diagnosis not present

## 2021-09-22 DIAGNOSIS — D509 Iron deficiency anemia, unspecified: Secondary | ICD-10-CM | POA: Diagnosis not present

## 2021-09-22 DIAGNOSIS — S51801D Unspecified open wound of right forearm, subsequent encounter: Secondary | ICD-10-CM | POA: Diagnosis not present

## 2021-09-22 DIAGNOSIS — L309 Dermatitis, unspecified: Secondary | ICD-10-CM | POA: Diagnosis not present

## 2021-09-22 DIAGNOSIS — I480 Paroxysmal atrial fibrillation: Secondary | ICD-10-CM | POA: Diagnosis not present

## 2021-09-22 DIAGNOSIS — I2510 Atherosclerotic heart disease of native coronary artery without angina pectoris: Secondary | ICD-10-CM | POA: Diagnosis not present

## 2021-09-22 DIAGNOSIS — I10 Essential (primary) hypertension: Secondary | ICD-10-CM | POA: Diagnosis not present

## 2021-09-22 DIAGNOSIS — Z87891 Personal history of nicotine dependence: Secondary | ICD-10-CM | POA: Diagnosis not present

## 2021-09-22 DIAGNOSIS — I459 Conduction disorder, unspecified: Secondary | ICD-10-CM | POA: Diagnosis not present

## 2021-09-22 DIAGNOSIS — E7800 Pure hypercholesterolemia, unspecified: Secondary | ICD-10-CM | POA: Diagnosis not present

## 2021-09-22 DIAGNOSIS — Z9181 History of falling: Secondary | ICD-10-CM | POA: Diagnosis not present

## 2021-09-22 DIAGNOSIS — Z7901 Long term (current) use of anticoagulants: Secondary | ICD-10-CM | POA: Diagnosis not present

## 2021-09-22 DIAGNOSIS — L89152 Pressure ulcer of sacral region, stage 2: Secondary | ICD-10-CM | POA: Diagnosis not present

## 2021-09-22 DIAGNOSIS — Z950 Presence of cardiac pacemaker: Secondary | ICD-10-CM | POA: Diagnosis not present

## 2021-09-22 DIAGNOSIS — D696 Thrombocytopenia, unspecified: Secondary | ICD-10-CM | POA: Diagnosis not present

## 2021-09-22 DIAGNOSIS — K5901 Slow transit constipation: Secondary | ICD-10-CM | POA: Diagnosis not present

## 2021-09-23 DIAGNOSIS — Z7901 Long term (current) use of anticoagulants: Secondary | ICD-10-CM | POA: Diagnosis not present

## 2021-09-23 DIAGNOSIS — Z950 Presence of cardiac pacemaker: Secondary | ICD-10-CM | POA: Diagnosis not present

## 2021-09-23 DIAGNOSIS — Z9181 History of falling: Secondary | ICD-10-CM | POA: Diagnosis not present

## 2021-09-23 DIAGNOSIS — L309 Dermatitis, unspecified: Secondary | ICD-10-CM | POA: Diagnosis not present

## 2021-09-23 DIAGNOSIS — I10 Essential (primary) hypertension: Secondary | ICD-10-CM | POA: Diagnosis not present

## 2021-09-23 DIAGNOSIS — D696 Thrombocytopenia, unspecified: Secondary | ICD-10-CM | POA: Diagnosis not present

## 2021-09-23 DIAGNOSIS — I480 Paroxysmal atrial fibrillation: Secondary | ICD-10-CM | POA: Diagnosis not present

## 2021-09-23 DIAGNOSIS — Z955 Presence of coronary angioplasty implant and graft: Secondary | ICD-10-CM | POA: Diagnosis not present

## 2021-09-23 DIAGNOSIS — L89152 Pressure ulcer of sacral region, stage 2: Secondary | ICD-10-CM | POA: Diagnosis not present

## 2021-09-23 DIAGNOSIS — R269 Unspecified abnormalities of gait and mobility: Secondary | ICD-10-CM | POA: Diagnosis not present

## 2021-09-23 DIAGNOSIS — E1151 Type 2 diabetes mellitus with diabetic peripheral angiopathy without gangrene: Secondary | ICD-10-CM | POA: Diagnosis not present

## 2021-09-23 DIAGNOSIS — S51801D Unspecified open wound of right forearm, subsequent encounter: Secondary | ICD-10-CM | POA: Diagnosis not present

## 2021-09-23 DIAGNOSIS — Z87891 Personal history of nicotine dependence: Secondary | ICD-10-CM | POA: Diagnosis not present

## 2021-09-23 DIAGNOSIS — I459 Conduction disorder, unspecified: Secondary | ICD-10-CM | POA: Diagnosis not present

## 2021-09-23 DIAGNOSIS — I2510 Atherosclerotic heart disease of native coronary artery without angina pectoris: Secondary | ICD-10-CM | POA: Diagnosis not present

## 2021-09-23 DIAGNOSIS — E7800 Pure hypercholesterolemia, unspecified: Secondary | ICD-10-CM | POA: Diagnosis not present

## 2021-09-23 DIAGNOSIS — D509 Iron deficiency anemia, unspecified: Secondary | ICD-10-CM | POA: Diagnosis not present

## 2021-09-23 DIAGNOSIS — K5901 Slow transit constipation: Secondary | ICD-10-CM | POA: Diagnosis not present

## 2021-09-25 DIAGNOSIS — S51812A Laceration without foreign body of left forearm, initial encounter: Secondary | ICD-10-CM | POA: Diagnosis not present

## 2021-09-25 DIAGNOSIS — S51811A Laceration without foreign body of right forearm, initial encounter: Secondary | ICD-10-CM | POA: Diagnosis not present

## 2021-09-26 DIAGNOSIS — Z87891 Personal history of nicotine dependence: Secondary | ICD-10-CM | POA: Diagnosis not present

## 2021-09-26 DIAGNOSIS — E1151 Type 2 diabetes mellitus with diabetic peripheral angiopathy without gangrene: Secondary | ICD-10-CM | POA: Diagnosis not present

## 2021-09-26 DIAGNOSIS — I480 Paroxysmal atrial fibrillation: Secondary | ICD-10-CM | POA: Diagnosis not present

## 2021-09-26 DIAGNOSIS — E7800 Pure hypercholesterolemia, unspecified: Secondary | ICD-10-CM | POA: Diagnosis not present

## 2021-09-26 DIAGNOSIS — Z7901 Long term (current) use of anticoagulants: Secondary | ICD-10-CM | POA: Diagnosis not present

## 2021-09-26 DIAGNOSIS — I10 Essential (primary) hypertension: Secondary | ICD-10-CM | POA: Diagnosis not present

## 2021-09-26 DIAGNOSIS — S51801D Unspecified open wound of right forearm, subsequent encounter: Secondary | ICD-10-CM | POA: Diagnosis not present

## 2021-09-26 DIAGNOSIS — Z9181 History of falling: Secondary | ICD-10-CM | POA: Diagnosis not present

## 2021-09-26 DIAGNOSIS — Z950 Presence of cardiac pacemaker: Secondary | ICD-10-CM | POA: Diagnosis not present

## 2021-09-26 DIAGNOSIS — I2510 Atherosclerotic heart disease of native coronary artery without angina pectoris: Secondary | ICD-10-CM | POA: Diagnosis not present

## 2021-09-26 DIAGNOSIS — Z955 Presence of coronary angioplasty implant and graft: Secondary | ICD-10-CM | POA: Diagnosis not present

## 2021-09-26 DIAGNOSIS — D509 Iron deficiency anemia, unspecified: Secondary | ICD-10-CM | POA: Diagnosis not present

## 2021-09-30 DIAGNOSIS — I2510 Atherosclerotic heart disease of native coronary artery without angina pectoris: Secondary | ICD-10-CM | POA: Diagnosis not present

## 2021-09-30 DIAGNOSIS — E1151 Type 2 diabetes mellitus with diabetic peripheral angiopathy without gangrene: Secondary | ICD-10-CM | POA: Diagnosis not present

## 2021-09-30 DIAGNOSIS — Z950 Presence of cardiac pacemaker: Secondary | ICD-10-CM | POA: Diagnosis not present

## 2021-09-30 DIAGNOSIS — Z955 Presence of coronary angioplasty implant and graft: Secondary | ICD-10-CM | POA: Diagnosis not present

## 2021-09-30 DIAGNOSIS — D509 Iron deficiency anemia, unspecified: Secondary | ICD-10-CM | POA: Diagnosis not present

## 2021-09-30 DIAGNOSIS — Z9181 History of falling: Secondary | ICD-10-CM | POA: Diagnosis not present

## 2021-09-30 DIAGNOSIS — Z87891 Personal history of nicotine dependence: Secondary | ICD-10-CM | POA: Diagnosis not present

## 2021-09-30 DIAGNOSIS — Z7901 Long term (current) use of anticoagulants: Secondary | ICD-10-CM | POA: Diagnosis not present

## 2021-09-30 DIAGNOSIS — I10 Essential (primary) hypertension: Secondary | ICD-10-CM | POA: Diagnosis not present

## 2021-09-30 DIAGNOSIS — S51801D Unspecified open wound of right forearm, subsequent encounter: Secondary | ICD-10-CM | POA: Diagnosis not present

## 2021-09-30 DIAGNOSIS — E7800 Pure hypercholesterolemia, unspecified: Secondary | ICD-10-CM | POA: Diagnosis not present

## 2021-09-30 DIAGNOSIS — I480 Paroxysmal atrial fibrillation: Secondary | ICD-10-CM | POA: Diagnosis not present

## 2021-10-01 ENCOUNTER — Other Ambulatory Visit: Payer: Self-pay | Admitting: Cardiology

## 2021-10-01 MED ORDER — CARVEDILOL 12.5 MG PO TABS: 12.5000 mg | ORAL_TABLET | Freq: Two times a day (BID) | ORAL | 0 refills | Status: DC

## 2021-10-02 DIAGNOSIS — Z9181 History of falling: Secondary | ICD-10-CM | POA: Diagnosis not present

## 2021-10-02 DIAGNOSIS — D509 Iron deficiency anemia, unspecified: Secondary | ICD-10-CM | POA: Diagnosis not present

## 2021-10-02 DIAGNOSIS — Z955 Presence of coronary angioplasty implant and graft: Secondary | ICD-10-CM | POA: Diagnosis not present

## 2021-10-02 DIAGNOSIS — I480 Paroxysmal atrial fibrillation: Secondary | ICD-10-CM | POA: Diagnosis not present

## 2021-10-02 DIAGNOSIS — S51801D Unspecified open wound of right forearm, subsequent encounter: Secondary | ICD-10-CM | POA: Diagnosis not present

## 2021-10-02 DIAGNOSIS — E7800 Pure hypercholesterolemia, unspecified: Secondary | ICD-10-CM | POA: Diagnosis not present

## 2021-10-02 DIAGNOSIS — Z950 Presence of cardiac pacemaker: Secondary | ICD-10-CM | POA: Diagnosis not present

## 2021-10-02 DIAGNOSIS — Z87891 Personal history of nicotine dependence: Secondary | ICD-10-CM | POA: Diagnosis not present

## 2021-10-02 DIAGNOSIS — I2510 Atherosclerotic heart disease of native coronary artery without angina pectoris: Secondary | ICD-10-CM | POA: Diagnosis not present

## 2021-10-02 DIAGNOSIS — Z7901 Long term (current) use of anticoagulants: Secondary | ICD-10-CM | POA: Diagnosis not present

## 2021-10-02 DIAGNOSIS — I10 Essential (primary) hypertension: Secondary | ICD-10-CM | POA: Diagnosis not present

## 2021-10-02 DIAGNOSIS — E1151 Type 2 diabetes mellitus with diabetic peripheral angiopathy without gangrene: Secondary | ICD-10-CM | POA: Diagnosis not present

## 2021-10-03 DIAGNOSIS — S51801D Unspecified open wound of right forearm, subsequent encounter: Secondary | ICD-10-CM | POA: Diagnosis not present

## 2021-10-03 DIAGNOSIS — D509 Iron deficiency anemia, unspecified: Secondary | ICD-10-CM | POA: Diagnosis not present

## 2021-10-03 DIAGNOSIS — Z87891 Personal history of nicotine dependence: Secondary | ICD-10-CM | POA: Diagnosis not present

## 2021-10-03 DIAGNOSIS — E7800 Pure hypercholesterolemia, unspecified: Secondary | ICD-10-CM | POA: Diagnosis not present

## 2021-10-03 DIAGNOSIS — Z9181 History of falling: Secondary | ICD-10-CM | POA: Diagnosis not present

## 2021-10-03 DIAGNOSIS — Z7901 Long term (current) use of anticoagulants: Secondary | ICD-10-CM | POA: Diagnosis not present

## 2021-10-03 DIAGNOSIS — E1151 Type 2 diabetes mellitus with diabetic peripheral angiopathy without gangrene: Secondary | ICD-10-CM | POA: Diagnosis not present

## 2021-10-03 DIAGNOSIS — I2510 Atherosclerotic heart disease of native coronary artery without angina pectoris: Secondary | ICD-10-CM | POA: Diagnosis not present

## 2021-10-03 DIAGNOSIS — Z950 Presence of cardiac pacemaker: Secondary | ICD-10-CM | POA: Diagnosis not present

## 2021-10-03 DIAGNOSIS — Z955 Presence of coronary angioplasty implant and graft: Secondary | ICD-10-CM | POA: Diagnosis not present

## 2021-10-03 DIAGNOSIS — I480 Paroxysmal atrial fibrillation: Secondary | ICD-10-CM | POA: Diagnosis not present

## 2021-10-03 DIAGNOSIS — I10 Essential (primary) hypertension: Secondary | ICD-10-CM | POA: Diagnosis not present

## 2021-10-06 DIAGNOSIS — Z7901 Long term (current) use of anticoagulants: Secondary | ICD-10-CM | POA: Diagnosis not present

## 2021-10-06 DIAGNOSIS — Z9181 History of falling: Secondary | ICD-10-CM | POA: Diagnosis not present

## 2021-10-06 DIAGNOSIS — Z950 Presence of cardiac pacemaker: Secondary | ICD-10-CM | POA: Diagnosis not present

## 2021-10-06 DIAGNOSIS — E1151 Type 2 diabetes mellitus with diabetic peripheral angiopathy without gangrene: Secondary | ICD-10-CM | POA: Diagnosis not present

## 2021-10-06 DIAGNOSIS — Z955 Presence of coronary angioplasty implant and graft: Secondary | ICD-10-CM | POA: Diagnosis not present

## 2021-10-06 DIAGNOSIS — I2510 Atherosclerotic heart disease of native coronary artery without angina pectoris: Secondary | ICD-10-CM | POA: Diagnosis not present

## 2021-10-06 DIAGNOSIS — I480 Paroxysmal atrial fibrillation: Secondary | ICD-10-CM | POA: Diagnosis not present

## 2021-10-06 DIAGNOSIS — I10 Essential (primary) hypertension: Secondary | ICD-10-CM | POA: Diagnosis not present

## 2021-10-06 DIAGNOSIS — D509 Iron deficiency anemia, unspecified: Secondary | ICD-10-CM | POA: Diagnosis not present

## 2021-10-06 DIAGNOSIS — S51801D Unspecified open wound of right forearm, subsequent encounter: Secondary | ICD-10-CM | POA: Diagnosis not present

## 2021-10-06 DIAGNOSIS — Z87891 Personal history of nicotine dependence: Secondary | ICD-10-CM | POA: Diagnosis not present

## 2021-10-06 DIAGNOSIS — E7800 Pure hypercholesterolemia, unspecified: Secondary | ICD-10-CM | POA: Diagnosis not present

## 2021-10-08 DIAGNOSIS — Z9181 History of falling: Secondary | ICD-10-CM | POA: Diagnosis not present

## 2021-10-08 DIAGNOSIS — Z950 Presence of cardiac pacemaker: Secondary | ICD-10-CM | POA: Diagnosis not present

## 2021-10-08 DIAGNOSIS — I480 Paroxysmal atrial fibrillation: Secondary | ICD-10-CM | POA: Diagnosis not present

## 2021-10-08 DIAGNOSIS — I10 Essential (primary) hypertension: Secondary | ICD-10-CM | POA: Diagnosis not present

## 2021-10-08 DIAGNOSIS — I2510 Atherosclerotic heart disease of native coronary artery without angina pectoris: Secondary | ICD-10-CM | POA: Diagnosis not present

## 2021-10-08 DIAGNOSIS — S51801D Unspecified open wound of right forearm, subsequent encounter: Secondary | ICD-10-CM | POA: Diagnosis not present

## 2021-10-08 DIAGNOSIS — D509 Iron deficiency anemia, unspecified: Secondary | ICD-10-CM | POA: Diagnosis not present

## 2021-10-08 DIAGNOSIS — Z87891 Personal history of nicotine dependence: Secondary | ICD-10-CM | POA: Diagnosis not present

## 2021-10-08 DIAGNOSIS — Z7901 Long term (current) use of anticoagulants: Secondary | ICD-10-CM | POA: Diagnosis not present

## 2021-10-08 DIAGNOSIS — Z955 Presence of coronary angioplasty implant and graft: Secondary | ICD-10-CM | POA: Diagnosis not present

## 2021-10-08 DIAGNOSIS — E1151 Type 2 diabetes mellitus with diabetic peripheral angiopathy without gangrene: Secondary | ICD-10-CM | POA: Diagnosis not present

## 2021-10-08 DIAGNOSIS — E7800 Pure hypercholesterolemia, unspecified: Secondary | ICD-10-CM | POA: Diagnosis not present

## 2021-10-10 DIAGNOSIS — I2510 Atherosclerotic heart disease of native coronary artery without angina pectoris: Secondary | ICD-10-CM | POA: Diagnosis not present

## 2021-10-10 DIAGNOSIS — I480 Paroxysmal atrial fibrillation: Secondary | ICD-10-CM | POA: Diagnosis not present

## 2021-10-10 DIAGNOSIS — E7800 Pure hypercholesterolemia, unspecified: Secondary | ICD-10-CM | POA: Diagnosis not present

## 2021-10-10 DIAGNOSIS — D509 Iron deficiency anemia, unspecified: Secondary | ICD-10-CM | POA: Diagnosis not present

## 2021-10-10 DIAGNOSIS — E1151 Type 2 diabetes mellitus with diabetic peripheral angiopathy without gangrene: Secondary | ICD-10-CM | POA: Diagnosis not present

## 2021-10-10 DIAGNOSIS — Z87891 Personal history of nicotine dependence: Secondary | ICD-10-CM | POA: Diagnosis not present

## 2021-10-10 DIAGNOSIS — Z950 Presence of cardiac pacemaker: Secondary | ICD-10-CM | POA: Diagnosis not present

## 2021-10-10 DIAGNOSIS — I10 Essential (primary) hypertension: Secondary | ICD-10-CM | POA: Diagnosis not present

## 2021-10-10 DIAGNOSIS — Z955 Presence of coronary angioplasty implant and graft: Secondary | ICD-10-CM | POA: Diagnosis not present

## 2021-10-10 DIAGNOSIS — S51801D Unspecified open wound of right forearm, subsequent encounter: Secondary | ICD-10-CM | POA: Diagnosis not present

## 2021-10-10 DIAGNOSIS — Z7901 Long term (current) use of anticoagulants: Secondary | ICD-10-CM | POA: Diagnosis not present

## 2021-10-10 DIAGNOSIS — Z9181 History of falling: Secondary | ICD-10-CM | POA: Diagnosis not present

## 2021-10-13 DIAGNOSIS — I10 Essential (primary) hypertension: Secondary | ICD-10-CM | POA: Diagnosis not present

## 2021-10-13 DIAGNOSIS — E1151 Type 2 diabetes mellitus with diabetic peripheral angiopathy without gangrene: Secondary | ICD-10-CM | POA: Diagnosis not present

## 2021-10-13 DIAGNOSIS — I2510 Atherosclerotic heart disease of native coronary artery without angina pectoris: Secondary | ICD-10-CM | POA: Diagnosis not present

## 2021-10-13 DIAGNOSIS — D509 Iron deficiency anemia, unspecified: Secondary | ICD-10-CM | POA: Diagnosis not present

## 2021-10-13 DIAGNOSIS — S51801D Unspecified open wound of right forearm, subsequent encounter: Secondary | ICD-10-CM | POA: Diagnosis not present

## 2021-10-13 DIAGNOSIS — I480 Paroxysmal atrial fibrillation: Secondary | ICD-10-CM | POA: Diagnosis not present

## 2021-10-13 DIAGNOSIS — Z955 Presence of coronary angioplasty implant and graft: Secondary | ICD-10-CM | POA: Diagnosis not present

## 2021-10-13 DIAGNOSIS — Z9181 History of falling: Secondary | ICD-10-CM | POA: Diagnosis not present

## 2021-10-13 DIAGNOSIS — Z950 Presence of cardiac pacemaker: Secondary | ICD-10-CM | POA: Diagnosis not present

## 2021-10-13 DIAGNOSIS — E7800 Pure hypercholesterolemia, unspecified: Secondary | ICD-10-CM | POA: Diagnosis not present

## 2021-10-13 DIAGNOSIS — Z87891 Personal history of nicotine dependence: Secondary | ICD-10-CM | POA: Diagnosis not present

## 2021-10-13 DIAGNOSIS — Z7901 Long term (current) use of anticoagulants: Secondary | ICD-10-CM | POA: Diagnosis not present

## 2021-10-15 DIAGNOSIS — Z955 Presence of coronary angioplasty implant and graft: Secondary | ICD-10-CM | POA: Diagnosis not present

## 2021-10-15 DIAGNOSIS — I480 Paroxysmal atrial fibrillation: Secondary | ICD-10-CM | POA: Diagnosis not present

## 2021-10-15 DIAGNOSIS — I2510 Atherosclerotic heart disease of native coronary artery without angina pectoris: Secondary | ICD-10-CM | POA: Diagnosis not present

## 2021-10-15 DIAGNOSIS — I10 Essential (primary) hypertension: Secondary | ICD-10-CM | POA: Diagnosis not present

## 2021-10-15 DIAGNOSIS — Z9181 History of falling: Secondary | ICD-10-CM | POA: Diagnosis not present

## 2021-10-15 DIAGNOSIS — Z87891 Personal history of nicotine dependence: Secondary | ICD-10-CM | POA: Diagnosis not present

## 2021-10-15 DIAGNOSIS — E1151 Type 2 diabetes mellitus with diabetic peripheral angiopathy without gangrene: Secondary | ICD-10-CM | POA: Diagnosis not present

## 2021-10-15 DIAGNOSIS — Z950 Presence of cardiac pacemaker: Secondary | ICD-10-CM | POA: Diagnosis not present

## 2021-10-15 DIAGNOSIS — S51801D Unspecified open wound of right forearm, subsequent encounter: Secondary | ICD-10-CM | POA: Diagnosis not present

## 2021-10-15 DIAGNOSIS — L89152 Pressure ulcer of sacral region, stage 2: Secondary | ICD-10-CM | POA: Diagnosis not present

## 2021-10-15 DIAGNOSIS — Z7901 Long term (current) use of anticoagulants: Secondary | ICD-10-CM | POA: Diagnosis not present

## 2021-10-15 DIAGNOSIS — E7800 Pure hypercholesterolemia, unspecified: Secondary | ICD-10-CM | POA: Diagnosis not present

## 2021-10-15 DIAGNOSIS — D509 Iron deficiency anemia, unspecified: Secondary | ICD-10-CM | POA: Diagnosis not present

## 2021-10-15 DIAGNOSIS — L8990 Pressure ulcer of unspecified site, unspecified stage: Secondary | ICD-10-CM | POA: Diagnosis not present

## 2021-10-17 DIAGNOSIS — Z7901 Long term (current) use of anticoagulants: Secondary | ICD-10-CM | POA: Diagnosis not present

## 2021-10-17 DIAGNOSIS — Z87891 Personal history of nicotine dependence: Secondary | ICD-10-CM | POA: Diagnosis not present

## 2021-10-17 DIAGNOSIS — I10 Essential (primary) hypertension: Secondary | ICD-10-CM | POA: Diagnosis not present

## 2021-10-17 DIAGNOSIS — I480 Paroxysmal atrial fibrillation: Secondary | ICD-10-CM | POA: Diagnosis not present

## 2021-10-17 DIAGNOSIS — I2510 Atherosclerotic heart disease of native coronary artery without angina pectoris: Secondary | ICD-10-CM | POA: Diagnosis not present

## 2021-10-17 DIAGNOSIS — E7800 Pure hypercholesterolemia, unspecified: Secondary | ICD-10-CM | POA: Diagnosis not present

## 2021-10-17 DIAGNOSIS — Z9181 History of falling: Secondary | ICD-10-CM | POA: Diagnosis not present

## 2021-10-17 DIAGNOSIS — Z950 Presence of cardiac pacemaker: Secondary | ICD-10-CM | POA: Diagnosis not present

## 2021-10-17 DIAGNOSIS — D509 Iron deficiency anemia, unspecified: Secondary | ICD-10-CM | POA: Diagnosis not present

## 2021-10-17 DIAGNOSIS — S51801D Unspecified open wound of right forearm, subsequent encounter: Secondary | ICD-10-CM | POA: Diagnosis not present

## 2021-10-17 DIAGNOSIS — E1151 Type 2 diabetes mellitus with diabetic peripheral angiopathy without gangrene: Secondary | ICD-10-CM | POA: Diagnosis not present

## 2021-10-17 DIAGNOSIS — Z955 Presence of coronary angioplasty implant and graft: Secondary | ICD-10-CM | POA: Diagnosis not present

## 2021-10-20 DIAGNOSIS — S51801D Unspecified open wound of right forearm, subsequent encounter: Secondary | ICD-10-CM | POA: Diagnosis not present

## 2021-10-20 DIAGNOSIS — I480 Paroxysmal atrial fibrillation: Secondary | ICD-10-CM | POA: Diagnosis not present

## 2021-10-20 DIAGNOSIS — Z950 Presence of cardiac pacemaker: Secondary | ICD-10-CM | POA: Diagnosis not present

## 2021-10-20 DIAGNOSIS — Z87891 Personal history of nicotine dependence: Secondary | ICD-10-CM | POA: Diagnosis not present

## 2021-10-20 DIAGNOSIS — E1151 Type 2 diabetes mellitus with diabetic peripheral angiopathy without gangrene: Secondary | ICD-10-CM | POA: Diagnosis not present

## 2021-10-20 DIAGNOSIS — E7800 Pure hypercholesterolemia, unspecified: Secondary | ICD-10-CM | POA: Diagnosis not present

## 2021-10-20 DIAGNOSIS — Z955 Presence of coronary angioplasty implant and graft: Secondary | ICD-10-CM | POA: Diagnosis not present

## 2021-10-20 DIAGNOSIS — Z7901 Long term (current) use of anticoagulants: Secondary | ICD-10-CM | POA: Diagnosis not present

## 2021-10-20 DIAGNOSIS — Z9181 History of falling: Secondary | ICD-10-CM | POA: Diagnosis not present

## 2021-10-20 DIAGNOSIS — I10 Essential (primary) hypertension: Secondary | ICD-10-CM | POA: Diagnosis not present

## 2021-10-20 DIAGNOSIS — D509 Iron deficiency anemia, unspecified: Secondary | ICD-10-CM | POA: Diagnosis not present

## 2021-10-20 DIAGNOSIS — I2510 Atherosclerotic heart disease of native coronary artery without angina pectoris: Secondary | ICD-10-CM | POA: Diagnosis not present

## 2021-10-23 ENCOUNTER — Encounter: Payer: Self-pay | Admitting: Student

## 2021-10-23 ENCOUNTER — Ambulatory Visit: Payer: Medicare Other | Admitting: Student

## 2021-10-23 ENCOUNTER — Other Ambulatory Visit: Payer: Self-pay

## 2021-10-23 VITALS — BP 96/74 | HR 67 | Ht 67.0 in | Wt 140.0 lb

## 2021-10-23 DIAGNOSIS — D509 Iron deficiency anemia, unspecified: Secondary | ICD-10-CM | POA: Diagnosis not present

## 2021-10-23 DIAGNOSIS — I480 Paroxysmal atrial fibrillation: Secondary | ICD-10-CM | POA: Diagnosis not present

## 2021-10-23 DIAGNOSIS — R001 Bradycardia, unspecified: Secondary | ICD-10-CM | POA: Diagnosis not present

## 2021-10-23 DIAGNOSIS — I10 Essential (primary) hypertension: Secondary | ICD-10-CM

## 2021-10-23 DIAGNOSIS — I495 Sick sinus syndrome: Secondary | ICD-10-CM

## 2021-10-23 DIAGNOSIS — Z9181 History of falling: Secondary | ICD-10-CM | POA: Diagnosis not present

## 2021-10-23 DIAGNOSIS — I259 Chronic ischemic heart disease, unspecified: Secondary | ICD-10-CM | POA: Diagnosis not present

## 2021-10-23 DIAGNOSIS — I2510 Atherosclerotic heart disease of native coronary artery without angina pectoris: Secondary | ICD-10-CM | POA: Diagnosis not present

## 2021-10-23 DIAGNOSIS — E7800 Pure hypercholesterolemia, unspecified: Secondary | ICD-10-CM | POA: Diagnosis not present

## 2021-10-23 DIAGNOSIS — Z955 Presence of coronary angioplasty implant and graft: Secondary | ICD-10-CM | POA: Diagnosis not present

## 2021-10-23 DIAGNOSIS — S51801D Unspecified open wound of right forearm, subsequent encounter: Secondary | ICD-10-CM | POA: Diagnosis not present

## 2021-10-23 DIAGNOSIS — Z950 Presence of cardiac pacemaker: Secondary | ICD-10-CM | POA: Diagnosis not present

## 2021-10-23 DIAGNOSIS — E1151 Type 2 diabetes mellitus with diabetic peripheral angiopathy without gangrene: Secondary | ICD-10-CM | POA: Diagnosis not present

## 2021-10-23 DIAGNOSIS — I739 Peripheral vascular disease, unspecified: Secondary | ICD-10-CM

## 2021-10-23 DIAGNOSIS — Z87891 Personal history of nicotine dependence: Secondary | ICD-10-CM | POA: Diagnosis not present

## 2021-10-23 DIAGNOSIS — Z7901 Long term (current) use of anticoagulants: Secondary | ICD-10-CM | POA: Diagnosis not present

## 2021-10-23 LAB — CUP PACEART INCLINIC DEVICE CHECK
Battery Remaining Longevity: 36 mo
Battery Voltage: 2.96 V
Brady Statistic AP VP Percent: 76.53 %
Brady Statistic AP VS Percent: 12.94 %
Brady Statistic AS VP Percent: 9.35 %
Brady Statistic AS VS Percent: 1.17 %
Brady Statistic RA Percent Paced: 87.82 %
Brady Statistic RV Percent Paced: 85.71 %
Date Time Interrogation Session: 20230216105632
Implantable Lead Implant Date: 20161216
Implantable Lead Implant Date: 20161216
Implantable Lead Location: 753859
Implantable Lead Location: 753860
Implantable Lead Model: 5076
Implantable Lead Model: 5076
Implantable Pulse Generator Implant Date: 20161216
Lead Channel Impedance Value: 342 Ohm
Lead Channel Impedance Value: 380 Ohm
Lead Channel Impedance Value: 437 Ohm
Lead Channel Impedance Value: 494 Ohm
Lead Channel Pacing Threshold Amplitude: 0.5 V
Lead Channel Pacing Threshold Amplitude: 0.625 V
Lead Channel Pacing Threshold Pulse Width: 0.4 ms
Lead Channel Pacing Threshold Pulse Width: 0.4 ms
Lead Channel Sensing Intrinsic Amplitude: 1.875 mV
Lead Channel Sensing Intrinsic Amplitude: 11.375 mV
Lead Channel Sensing Intrinsic Amplitude: 11.375 mV
Lead Channel Sensing Intrinsic Amplitude: 2.75 mV
Lead Channel Setting Pacing Amplitude: 2 V
Lead Channel Setting Pacing Amplitude: 2.5 V
Lead Channel Setting Pacing Pulse Width: 0.4 ms
Lead Channel Setting Sensing Sensitivity: 2 mV

## 2021-10-23 LAB — BASIC METABOLIC PANEL
BUN/Creatinine Ratio: 18 (ref 10–24)
BUN: 19 mg/dL (ref 8–27)
CO2: 31 mmol/L — ABNORMAL HIGH (ref 20–29)
Calcium: 10 mg/dL (ref 8.6–10.2)
Chloride: 97 mmol/L (ref 96–106)
Creatinine, Ser: 1.06 mg/dL (ref 0.76–1.27)
Glucose: 228 mg/dL — ABNORMAL HIGH (ref 70–99)
Potassium: 4.4 mmol/L (ref 3.5–5.2)
Sodium: 137 mmol/L (ref 134–144)
eGFR: 67 mL/min/{1.73_m2} (ref >59)

## 2021-10-23 LAB — CBC
Hematocrit: 37.6 % (ref 37.5–51.0)
Hemoglobin: 12.8 g/dL — ABNORMAL LOW (ref 13.0–17.7)
MCH: 30.4 pg (ref 26.6–33.0)
MCHC: 34 g/dL (ref 31.5–35.7)
MCV: 89 fL (ref 79–97)
Platelets: 191 10*3/uL (ref 150–450)
RBC: 4.21 x10E6/uL (ref 4.14–5.80)
RDW: 15.8 % — ABNORMAL HIGH (ref 11.6–15.4)
WBC: 6.3 10*3/uL (ref 3.4–10.8)

## 2021-10-23 MED ORDER — CARVEDILOL 12.5 MG PO TABS: 12.5000 mg | ORAL_TABLET | Freq: Two times a day (BID) | ORAL | 3 refills | Status: DC

## 2021-10-23 NOTE — Progress Notes (Signed)
Electrophysiology Office Note Date: 10/23/2021  ID:  [name] [name], DOB 08/21/1930, MRN [id number]  PCP: Lajean Manes, MD Primary Cardiologist: None Electrophysiologist: Will Meredith Leeds, MD   CC: Pacemaker follow-up  [name] [name] is a 88 y.o. male seen today for Will Meredith Leeds, MD for routine electrophysiology followup.  Since last being seen in our clinic the patient reports doing OK. Caregiver present denies any acute complaints. Had a skin cancer resected from his arm last year, and has had functional decline on-going since. Essentially WC bound.  he denies chest pain, palpitations, dyspnea, PND, orthopnea, nausea, vomiting, dizziness, syncope, edema, weight gain, or early satiety.  Device History: Medtronic Dual Chamber PPM implanted 08/2015 for stokes adam syncope / bradycardia  Past Medical History:  Diagnosis Date   AAA (abdominal aortic aneurysm)    Abnormal Holter monitor finding November 2012   Runs of SVT with profound bradycardia   Aortic stenosis    s/p AVR in 2004   Arthritis    Bifascicular block    Complete heart block ([place]) 08/23/15   MDT PPM Dr. Curt Bears    Coronary artery disease    s/p CABG in 2004   Hypercholesterolemia    On Simvastatin   Right bundle branch block    Sinus bradycardia    Spasmodic torticollis    recent with persistant headaches   Past Surgical History:  Procedure Laterality Date   ABDOMINAL AORTIC ENDOVASCULAR STENT GRAFT N/A 08/18/2013   Procedure: ABDOMINAL AORTIC ENDOVASCULAR STENT GRAFT;  Surgeon: Rosetta Posner, MD;  Location: [facility];  Service: Vascular;  Laterality: N/A;   adeniod ectomy     AORTIC VALVE REPLACEMENT  10-30-02   insertion of a bioprosthetic #50mm AVR   CARDIAC CATHETERIZATION N/A 08/21/2015   Procedure: Temporary Pacemaker;  Surgeon: Sherren Mocha, MD;  Location: [facility];  Service: Cardiovascular;  Laterality: N/A;   CORONARY ARTERY BYPASS GRAFT  10-30-02   x8 per Dr. Cyndia Bent with LIMA to  LAD, SVG to PD, SVG to 2nd DX/1st DX, & SVG to subbranch of the 1st DX & 1st OM   EP IMPLANTABLE DEVICE N/A 08/23/2015   Procedure: Pacemaker Implant;  Surgeon: Will Meredith Leeds, MD;  Location: [facility];  Service: Cardiovascular;  Laterality: N/A;   INGUINAL HERNIA REPAIR Bilateral 01/15/2015   Procedure: LAPAROSCOPIC BILATERAL INGUINAL HERNIA REPAIR;  Surgeon: Ralene Ok, MD;  [facility];  Service: General;  Laterality: Bilateral;    Current Outpatient Medications  Medication Sig Dispense Refill   acetaminophen (TYLENOL) 325 MG tablet Take 325 mg by mouth every 6 (six) hours as needed for mild pain.      atorvastatin (LIPITOR) 10 MG tablet Take 10 mg by mouth daily.     brimonidine (ALPHAGAN P) 0.1 % SOLN Place 1 drop into both eyes 2 (two) times daily.      carvedilol (COREG) 12.5 MG tablet Take 1 tablet (12.5 mg total) by mouth 2 (two) times daily. Please keep upcoming appointment in February 2023 for future refills. 2nd attempt. Thank you 60 tablet 0   furosemide (LASIX) 40 MG tablet Take 40 mg by mouth daily.  1   LUMIGAN 0.01 % SOLN Place 1 drop into both eyes at bedtime.  3   polyethylene glycol powder (GLYCOLAX/MIRALAX) powder Take 17 g by mouth daily.     potassium chloride SA (KLOR-CON M20) 20 MEQ tablet TAKE 1 TABLET (20 MEQ TOTAL) BY MOUTH DAILY. 90 tablet 2   apixaban (  ELIQUIS) 5 MG TABS tablet Take 1 tablet (5 mg total) by mouth 2 (two) times daily. 180 tablet 3   docusate sodium (COLACE) 100 MG capsule Take 200 mg by mouth daily. (Patient not taking: Reported on 10/23/2021)     No current facility-administered medications for this visit.    Allergies:   Patient has no known allergies.   Social History: Social History   Socioeconomic History   Marital status: Widowed    Spouse name: Not on file   Number of children: Not on file   Years of education: Not on file   Highest education level: Not on file  Occupational History   Not on file  Tobacco  Use   Smoking status: Former    Types: Cigarettes    Quit date: 08/15/1953    Years since quitting: 70.2    Passive exposure: Past   Smokeless tobacco: Former    Quit date: 08/15/1954  Vaping Use   Vaping Use: Never used  Substance and Sexual Activity   Alcohol use: No   Drug use: No   Sexual activity: Not on file  Other Topics Concern   Not on file  Social History Narrative   Not on file   Social Determinants of Health   Financial Resource Strain: Not on file  Food Insecurity: Not on file  Transportation Needs: Not on file  Physical Activity: Not on file  Stress: Not on file  Social Connections: Not on file  Intimate Partner Violence: Not on file    Family History: Family History  Problem Relation Age of Onset   Hypertension Mother    Hypertension Sister      Review of Systems: All other systems reviewed and are otherwise negative except as noted above.  Physical Exam: Vitals:   10/23/21 1029  BP: 96/74  Pulse: 67  SpO2: 94%  Weight: 140 lb (63.5 kg)  Height: 5\' 7"  (1.702 m)     GEN- The patient is elderly appearing, alert and oriented x 3 today.   HEENT: normocephalic, atraumatic; sclera clear, conjunctiva pink; hearing intact; oropharynx clear; neck supple  Lungs- Clear to ausculation bilaterally, normal work of breathing.  No wheezes, rales, rhonchi Heart- Regular rate and rhythm, no murmurs, rubs or gallops  GI- soft, non-tender, non-distended, bowel sounds present  Extremities- no clubbing or cyanosis. No edema MS- no significant deformity or atrophy Skin- warm and dry, no rash or lesion; PPM pocket well healed Psych- euthymic mood, full affect Neuro- strength and sensation are intact  PPM Interrogation- reviewed in detail today,  See PACEART report  EKG:  EKG is ordered today. Personal review of ekg ordered today shows intermittent A pacing, V pacing at 67 bpm   Recent Labs: No results found for requested labs within last 8760 hours.   Wt  Readings from Last 3 Encounters:  10/23/21 140 lb (63.5 kg)  05/23/21 143 lb (64.9 kg)  07/23/20 139 lb 12.8 oz (63.4 kg)     Other studies Reviewed: Additional studies/ records that were reviewed today include: Previous EP office notes, Previous remote checks, Most recent labwork.   Assessment and Plan:  1. Symptomatic bradycardia s/p Medtronic PPM  Normal PPM function See Pace Art report No changes today  2. H/o Bioprosthetic AV  3. PAF Currently on eliquis CHA2DS2/VASc is at least 3  4. HTN Stable on current regimen   5. HLD Continue atorvastatin  6. CAD s/p CABG Denies s/s ischemia   Current medicines are reviewed at  length with the patient today.    Labs/ tests ordered today include:  Orders Placed This Encounter  Procedures   Basic metabolic panel   CBC   CUP PACEART INCLINIC DEVICE CHECK   EKG 12-Lead     Disposition:   Follow up with Dr. Curt Bears in 12 months    Signed, Shirley Friar, PA-C  10/23/2021 10:37 AM  [facility] [address] [place] [address] [phone] (office) [phone] (fax)

## 2021-10-23 NOTE — Patient Instructions (Signed)
Medication Instructions:  Your physician recommends that you continue on your current medications as directed. Please refer to the Current Medication list given to you today.  *If you need a refill on your cardiac medications before your next appointment, please call your pharmacy*   Lab Work: TODAY: BMET, CBC  If you have labs (blood work) drawn today and your tests are completely normal, you will receive your results only by: [facility] (if you have MyChart) OR A paper copy in the mail If you have any lab test that is abnormal or we need to change your treatment, we will call you to review the results.   Follow-Up: At [facility], you and your health needs are our priority.  As part of our continuing mission to provide you with exceptional heart care, we have created designated Provider Care Teams.  These Care Teams include your primary Cardiologist (physician) and Advanced Practice Providers (APPs -  Physician Assistants and Nurse Practitioners) who all work together to provide you with the care you need, when you need it.   Your next appointment:   1 year(s)  The format for your next appointment:   In Person  Provider:   You may see Will Meredith Leeds, MD or one of the following Advanced Practice Providers on your designated Care Team:   Tommye Standard, [place] Legrand Como "[facility]" [place], [place]

## 2021-10-24 DIAGNOSIS — Z9181 History of falling: Secondary | ICD-10-CM | POA: Diagnosis not present

## 2021-10-24 DIAGNOSIS — S51801D Unspecified open wound of right forearm, subsequent encounter: Secondary | ICD-10-CM | POA: Diagnosis not present

## 2021-10-24 DIAGNOSIS — I480 Paroxysmal atrial fibrillation: Secondary | ICD-10-CM | POA: Diagnosis not present

## 2021-10-24 DIAGNOSIS — Z87891 Personal history of nicotine dependence: Secondary | ICD-10-CM | POA: Diagnosis not present

## 2021-10-24 DIAGNOSIS — I2510 Atherosclerotic heart disease of native coronary artery without angina pectoris: Secondary | ICD-10-CM | POA: Diagnosis not present

## 2021-10-24 DIAGNOSIS — E7800 Pure hypercholesterolemia, unspecified: Secondary | ICD-10-CM | POA: Diagnosis not present

## 2021-10-24 DIAGNOSIS — Z955 Presence of coronary angioplasty implant and graft: Secondary | ICD-10-CM | POA: Diagnosis not present

## 2021-10-24 DIAGNOSIS — E1151 Type 2 diabetes mellitus with diabetic peripheral angiopathy without gangrene: Secondary | ICD-10-CM | POA: Diagnosis not present

## 2021-10-24 DIAGNOSIS — Z950 Presence of cardiac pacemaker: Secondary | ICD-10-CM | POA: Diagnosis not present

## 2021-10-24 DIAGNOSIS — I10 Essential (primary) hypertension: Secondary | ICD-10-CM | POA: Diagnosis not present

## 2021-10-24 DIAGNOSIS — D509 Iron deficiency anemia, unspecified: Secondary | ICD-10-CM | POA: Diagnosis not present

## 2021-10-24 DIAGNOSIS — Z7901 Long term (current) use of anticoagulants: Secondary | ICD-10-CM | POA: Diagnosis not present

## 2021-10-27 DIAGNOSIS — S51801D Unspecified open wound of right forearm, subsequent encounter: Secondary | ICD-10-CM | POA: Diagnosis not present

## 2021-10-27 DIAGNOSIS — E1151 Type 2 diabetes mellitus with diabetic peripheral angiopathy without gangrene: Secondary | ICD-10-CM | POA: Diagnosis not present

## 2021-10-27 DIAGNOSIS — E7800 Pure hypercholesterolemia, unspecified: Secondary | ICD-10-CM | POA: Diagnosis not present

## 2021-10-27 DIAGNOSIS — Z955 Presence of coronary angioplasty implant and graft: Secondary | ICD-10-CM | POA: Diagnosis not present

## 2021-10-27 DIAGNOSIS — Z950 Presence of cardiac pacemaker: Secondary | ICD-10-CM | POA: Diagnosis not present

## 2021-10-27 DIAGNOSIS — D509 Iron deficiency anemia, unspecified: Secondary | ICD-10-CM | POA: Diagnosis not present

## 2021-10-27 DIAGNOSIS — Z87891 Personal history of nicotine dependence: Secondary | ICD-10-CM | POA: Diagnosis not present

## 2021-10-27 DIAGNOSIS — Z7901 Long term (current) use of anticoagulants: Secondary | ICD-10-CM | POA: Diagnosis not present

## 2021-10-27 DIAGNOSIS — Z9181 History of falling: Secondary | ICD-10-CM | POA: Diagnosis not present

## 2021-10-27 DIAGNOSIS — I10 Essential (primary) hypertension: Secondary | ICD-10-CM | POA: Diagnosis not present

## 2021-10-27 DIAGNOSIS — I2510 Atherosclerotic heart disease of native coronary artery without angina pectoris: Secondary | ICD-10-CM | POA: Diagnosis not present

## 2021-10-27 DIAGNOSIS — I480 Paroxysmal atrial fibrillation: Secondary | ICD-10-CM | POA: Diagnosis not present

## 2021-10-31 DIAGNOSIS — S51801D Unspecified open wound of right forearm, subsequent encounter: Secondary | ICD-10-CM | POA: Diagnosis not present

## 2021-10-31 DIAGNOSIS — Z955 Presence of coronary angioplasty implant and graft: Secondary | ICD-10-CM | POA: Diagnosis not present

## 2021-10-31 DIAGNOSIS — E7800 Pure hypercholesterolemia, unspecified: Secondary | ICD-10-CM | POA: Diagnosis not present

## 2021-10-31 DIAGNOSIS — D509 Iron deficiency anemia, unspecified: Secondary | ICD-10-CM | POA: Diagnosis not present

## 2021-10-31 DIAGNOSIS — Z87891 Personal history of nicotine dependence: Secondary | ICD-10-CM | POA: Diagnosis not present

## 2021-10-31 DIAGNOSIS — E1151 Type 2 diabetes mellitus with diabetic peripheral angiopathy without gangrene: Secondary | ICD-10-CM | POA: Diagnosis not present

## 2021-10-31 DIAGNOSIS — Z7901 Long term (current) use of anticoagulants: Secondary | ICD-10-CM | POA: Diagnosis not present

## 2021-10-31 DIAGNOSIS — I480 Paroxysmal atrial fibrillation: Secondary | ICD-10-CM | POA: Diagnosis not present

## 2021-10-31 DIAGNOSIS — Z950 Presence of cardiac pacemaker: Secondary | ICD-10-CM | POA: Diagnosis not present

## 2021-10-31 DIAGNOSIS — Z9181 History of falling: Secondary | ICD-10-CM | POA: Diagnosis not present

## 2021-10-31 DIAGNOSIS — I10 Essential (primary) hypertension: Secondary | ICD-10-CM | POA: Diagnosis not present

## 2021-10-31 DIAGNOSIS — I2510 Atherosclerotic heart disease of native coronary artery without angina pectoris: Secondary | ICD-10-CM | POA: Diagnosis not present

## 2021-11-03 DIAGNOSIS — Z87891 Personal history of nicotine dependence: Secondary | ICD-10-CM | POA: Diagnosis not present

## 2021-11-03 DIAGNOSIS — I10 Essential (primary) hypertension: Secondary | ICD-10-CM | POA: Diagnosis not present

## 2021-11-03 DIAGNOSIS — E1151 Type 2 diabetes mellitus with diabetic peripheral angiopathy without gangrene: Secondary | ICD-10-CM | POA: Diagnosis not present

## 2021-11-03 DIAGNOSIS — Z950 Presence of cardiac pacemaker: Secondary | ICD-10-CM | POA: Diagnosis not present

## 2021-11-03 DIAGNOSIS — I2510 Atherosclerotic heart disease of native coronary artery without angina pectoris: Secondary | ICD-10-CM | POA: Diagnosis not present

## 2021-11-03 DIAGNOSIS — S51801D Unspecified open wound of right forearm, subsequent encounter: Secondary | ICD-10-CM | POA: Diagnosis not present

## 2021-11-03 DIAGNOSIS — Z9181 History of falling: Secondary | ICD-10-CM | POA: Diagnosis not present

## 2021-11-03 DIAGNOSIS — Z7901 Long term (current) use of anticoagulants: Secondary | ICD-10-CM | POA: Diagnosis not present

## 2021-11-03 DIAGNOSIS — D509 Iron deficiency anemia, unspecified: Secondary | ICD-10-CM | POA: Diagnosis not present

## 2021-11-03 DIAGNOSIS — Z955 Presence of coronary angioplasty implant and graft: Secondary | ICD-10-CM | POA: Diagnosis not present

## 2021-11-03 DIAGNOSIS — E7800 Pure hypercholesterolemia, unspecified: Secondary | ICD-10-CM | POA: Diagnosis not present

## 2021-11-03 DIAGNOSIS — I480 Paroxysmal atrial fibrillation: Secondary | ICD-10-CM | POA: Diagnosis not present

## 2021-11-06 DIAGNOSIS — Z950 Presence of cardiac pacemaker: Secondary | ICD-10-CM | POA: Diagnosis not present

## 2021-11-06 DIAGNOSIS — I2510 Atherosclerotic heart disease of native coronary artery without angina pectoris: Secondary | ICD-10-CM | POA: Diagnosis not present

## 2021-11-06 DIAGNOSIS — E1151 Type 2 diabetes mellitus with diabetic peripheral angiopathy without gangrene: Secondary | ICD-10-CM | POA: Diagnosis not present

## 2021-11-06 DIAGNOSIS — Z955 Presence of coronary angioplasty implant and graft: Secondary | ICD-10-CM | POA: Diagnosis not present

## 2021-11-06 DIAGNOSIS — Z7901 Long term (current) use of anticoagulants: Secondary | ICD-10-CM | POA: Diagnosis not present

## 2021-11-06 DIAGNOSIS — S51801D Unspecified open wound of right forearm, subsequent encounter: Secondary | ICD-10-CM | POA: Diagnosis not present

## 2021-11-06 DIAGNOSIS — Z9181 History of falling: Secondary | ICD-10-CM | POA: Diagnosis not present

## 2021-11-06 DIAGNOSIS — Z87891 Personal history of nicotine dependence: Secondary | ICD-10-CM | POA: Diagnosis not present

## 2021-11-06 DIAGNOSIS — I480 Paroxysmal atrial fibrillation: Secondary | ICD-10-CM | POA: Diagnosis not present

## 2021-11-06 DIAGNOSIS — E7800 Pure hypercholesterolemia, unspecified: Secondary | ICD-10-CM | POA: Diagnosis not present

## 2021-11-06 DIAGNOSIS — D509 Iron deficiency anemia, unspecified: Secondary | ICD-10-CM | POA: Diagnosis not present

## 2021-11-06 DIAGNOSIS — I10 Essential (primary) hypertension: Secondary | ICD-10-CM | POA: Diagnosis not present

## 2021-11-07 DIAGNOSIS — E1151 Type 2 diabetes mellitus with diabetic peripheral angiopathy without gangrene: Secondary | ICD-10-CM | POA: Diagnosis not present

## 2021-11-07 DIAGNOSIS — D509 Iron deficiency anemia, unspecified: Secondary | ICD-10-CM | POA: Diagnosis not present

## 2021-11-07 DIAGNOSIS — Z87891 Personal history of nicotine dependence: Secondary | ICD-10-CM | POA: Diagnosis not present

## 2021-11-07 DIAGNOSIS — Z955 Presence of coronary angioplasty implant and graft: Secondary | ICD-10-CM | POA: Diagnosis not present

## 2021-11-07 DIAGNOSIS — Z7901 Long term (current) use of anticoagulants: Secondary | ICD-10-CM | POA: Diagnosis not present

## 2021-11-07 DIAGNOSIS — E7800 Pure hypercholesterolemia, unspecified: Secondary | ICD-10-CM | POA: Diagnosis not present

## 2021-11-07 DIAGNOSIS — I10 Essential (primary) hypertension: Secondary | ICD-10-CM | POA: Diagnosis not present

## 2021-11-07 DIAGNOSIS — Z950 Presence of cardiac pacemaker: Secondary | ICD-10-CM | POA: Diagnosis not present

## 2021-11-07 DIAGNOSIS — Z9181 History of falling: Secondary | ICD-10-CM | POA: Diagnosis not present

## 2021-11-07 DIAGNOSIS — I2510 Atherosclerotic heart disease of native coronary artery without angina pectoris: Secondary | ICD-10-CM | POA: Diagnosis not present

## 2021-11-07 DIAGNOSIS — I480 Paroxysmal atrial fibrillation: Secondary | ICD-10-CM | POA: Diagnosis not present

## 2021-11-07 DIAGNOSIS — S51801D Unspecified open wound of right forearm, subsequent encounter: Secondary | ICD-10-CM | POA: Diagnosis not present

## 2021-11-10 DIAGNOSIS — D509 Iron deficiency anemia, unspecified: Secondary | ICD-10-CM | POA: Diagnosis not present

## 2021-11-10 DIAGNOSIS — I480 Paroxysmal atrial fibrillation: Secondary | ICD-10-CM | POA: Diagnosis not present

## 2021-11-10 DIAGNOSIS — Z9181 History of falling: Secondary | ICD-10-CM | POA: Diagnosis not present

## 2021-11-10 DIAGNOSIS — E1151 Type 2 diabetes mellitus with diabetic peripheral angiopathy without gangrene: Secondary | ICD-10-CM | POA: Diagnosis not present

## 2021-11-10 DIAGNOSIS — I10 Essential (primary) hypertension: Secondary | ICD-10-CM | POA: Diagnosis not present

## 2021-11-10 DIAGNOSIS — Z7901 Long term (current) use of anticoagulants: Secondary | ICD-10-CM | POA: Diagnosis not present

## 2021-11-10 DIAGNOSIS — Z87891 Personal history of nicotine dependence: Secondary | ICD-10-CM | POA: Diagnosis not present

## 2021-11-10 DIAGNOSIS — I2510 Atherosclerotic heart disease of native coronary artery without angina pectoris: Secondary | ICD-10-CM | POA: Diagnosis not present

## 2021-11-10 DIAGNOSIS — S51801D Unspecified open wound of right forearm, subsequent encounter: Secondary | ICD-10-CM | POA: Diagnosis not present

## 2021-11-10 DIAGNOSIS — Z950 Presence of cardiac pacemaker: Secondary | ICD-10-CM | POA: Diagnosis not present

## 2021-11-10 DIAGNOSIS — E7800 Pure hypercholesterolemia, unspecified: Secondary | ICD-10-CM | POA: Diagnosis not present

## 2021-11-10 DIAGNOSIS — Z955 Presence of coronary angioplasty implant and graft: Secondary | ICD-10-CM | POA: Diagnosis not present

## 2021-11-12 DIAGNOSIS — Z955 Presence of coronary angioplasty implant and graft: Secondary | ICD-10-CM | POA: Diagnosis not present

## 2021-11-12 DIAGNOSIS — Z950 Presence of cardiac pacemaker: Secondary | ICD-10-CM | POA: Diagnosis not present

## 2021-11-12 DIAGNOSIS — D509 Iron deficiency anemia, unspecified: Secondary | ICD-10-CM | POA: Diagnosis not present

## 2021-11-12 DIAGNOSIS — Z7901 Long term (current) use of anticoagulants: Secondary | ICD-10-CM | POA: Diagnosis not present

## 2021-11-12 DIAGNOSIS — I10 Essential (primary) hypertension: Secondary | ICD-10-CM | POA: Diagnosis not present

## 2021-11-12 DIAGNOSIS — E1151 Type 2 diabetes mellitus with diabetic peripheral angiopathy without gangrene: Secondary | ICD-10-CM | POA: Diagnosis not present

## 2021-11-12 DIAGNOSIS — I2510 Atherosclerotic heart disease of native coronary artery without angina pectoris: Secondary | ICD-10-CM | POA: Diagnosis not present

## 2021-11-12 DIAGNOSIS — S51801D Unspecified open wound of right forearm, subsequent encounter: Secondary | ICD-10-CM | POA: Diagnosis not present

## 2021-11-12 DIAGNOSIS — I480 Paroxysmal atrial fibrillation: Secondary | ICD-10-CM | POA: Diagnosis not present

## 2021-11-12 DIAGNOSIS — Z9181 History of falling: Secondary | ICD-10-CM | POA: Diagnosis not present

## 2021-11-12 DIAGNOSIS — E7800 Pure hypercholesterolemia, unspecified: Secondary | ICD-10-CM | POA: Diagnosis not present

## 2021-11-12 DIAGNOSIS — Z87891 Personal history of nicotine dependence: Secondary | ICD-10-CM | POA: Diagnosis not present

## 2021-11-14 DIAGNOSIS — Z9181 History of falling: Secondary | ICD-10-CM | POA: Diagnosis not present

## 2021-11-14 DIAGNOSIS — Z7901 Long term (current) use of anticoagulants: Secondary | ICD-10-CM | POA: Diagnosis not present

## 2021-11-14 DIAGNOSIS — I2510 Atherosclerotic heart disease of native coronary artery without angina pectoris: Secondary | ICD-10-CM | POA: Diagnosis not present

## 2021-11-14 DIAGNOSIS — I480 Paroxysmal atrial fibrillation: Secondary | ICD-10-CM | POA: Diagnosis not present

## 2021-11-14 DIAGNOSIS — I10 Essential (primary) hypertension: Secondary | ICD-10-CM | POA: Diagnosis not present

## 2021-11-14 DIAGNOSIS — S51801D Unspecified open wound of right forearm, subsequent encounter: Secondary | ICD-10-CM | POA: Diagnosis not present

## 2021-11-14 DIAGNOSIS — E1151 Type 2 diabetes mellitus with diabetic peripheral angiopathy without gangrene: Secondary | ICD-10-CM | POA: Diagnosis not present

## 2021-11-14 DIAGNOSIS — Z955 Presence of coronary angioplasty implant and graft: Secondary | ICD-10-CM | POA: Diagnosis not present

## 2021-11-14 DIAGNOSIS — Z950 Presence of cardiac pacemaker: Secondary | ICD-10-CM | POA: Diagnosis not present

## 2021-11-14 DIAGNOSIS — Z87891 Personal history of nicotine dependence: Secondary | ICD-10-CM | POA: Diagnosis not present

## 2021-11-14 DIAGNOSIS — E7800 Pure hypercholesterolemia, unspecified: Secondary | ICD-10-CM | POA: Diagnosis not present

## 2021-11-14 DIAGNOSIS — D509 Iron deficiency anemia, unspecified: Secondary | ICD-10-CM | POA: Diagnosis not present

## 2021-11-26 ENCOUNTER — Ambulatory Visit (INDEPENDENT_AMBULATORY_CARE_PROVIDER_SITE_OTHER): Payer: Medicare Other

## 2021-11-26 DIAGNOSIS — I495 Sick sinus syndrome: Secondary | ICD-10-CM

## 2021-11-27 LAB — CUP PACEART REMOTE DEVICE CHECK
Battery Remaining Longevity: 37 mo
Battery Voltage: 2.96 V
Brady Statistic AP VP Percent: 81.05 %
Brady Statistic AP VS Percent: 0.01 %
Brady Statistic AS VP Percent: 18.94 %
Brady Statistic AS VS Percent: 0.01 %
Brady Statistic RA Percent Paced: 78.37 %
Brady Statistic RV Percent Paced: 99.92 %
Date Time Interrogation Session: 20230323132452
Implantable Lead Implant Date: 20161216
Implantable Lead Implant Date: 20161216
Implantable Lead Location: 753859
Implantable Lead Location: 753860
Implantable Lead Model: 5076
Implantable Lead Model: 5076
Implantable Pulse Generator Implant Date: 20161216
Lead Channel Impedance Value: 342 Ohm
Lead Channel Impedance Value: 361 Ohm
Lead Channel Impedance Value: 437 Ohm
Lead Channel Impedance Value: 475 Ohm
Lead Channel Pacing Threshold Amplitude: 0.5 V
Lead Channel Pacing Threshold Amplitude: 0.75 V
Lead Channel Pacing Threshold Pulse Width: 0.4 ms
Lead Channel Pacing Threshold Pulse Width: 0.4 ms
Lead Channel Sensing Intrinsic Amplitude: 11.375 mV
Lead Channel Sensing Intrinsic Amplitude: 11.375 mV
Lead Channel Sensing Intrinsic Amplitude: 2.875 mV
Lead Channel Sensing Intrinsic Amplitude: 2.875 mV
Lead Channel Setting Pacing Amplitude: 2 V
Lead Channel Setting Pacing Amplitude: 2.5 V
Lead Channel Setting Pacing Pulse Width: 0.4 ms
Lead Channel Setting Sensing Sensitivity: 2 mV

## 2021-12-01 ENCOUNTER — Inpatient Hospital Stay (HOSPITAL_COMMUNITY)
Admission: EM | Admit: 2021-12-01 | Discharge: 2021-12-06 | DRG: 070 | Disposition: A | Discharge status: Skilled Nursing Facility | Payer: Medicare Other | Attending: Internal Medicine | Admitting: Internal Medicine

## 2021-12-01 ENCOUNTER — Encounter (HOSPITAL_COMMUNITY): Payer: Self-pay

## 2021-12-01 ENCOUNTER — Other Ambulatory Visit: Payer: Self-pay

## 2021-12-01 ENCOUNTER — Emergency Department (HOSPITAL_COMMUNITY): Payer: Medicare Other

## 2021-12-01 DIAGNOSIS — I2510 Atherosclerotic heart disease of native coronary artery without angina pectoris: Secondary | ICD-10-CM | POA: Diagnosis not present

## 2021-12-01 DIAGNOSIS — R6250 Unspecified lack of expected normal physiological development in childhood: Secondary | ICD-10-CM | POA: Diagnosis not present

## 2021-12-01 DIAGNOSIS — N390 Urinary tract infection, site not specified: Secondary | ICD-10-CM | POA: Diagnosis present

## 2021-12-01 DIAGNOSIS — Z7901 Long term (current) use of anticoagulants: Secondary | ICD-10-CM

## 2021-12-01 DIAGNOSIS — Z7401 Bed confinement status: Secondary | ICD-10-CM | POA: Diagnosis not present

## 2021-12-01 DIAGNOSIS — Z20822 Contact with and (suspected) exposure to covid-19: Secondary | ICD-10-CM | POA: Diagnosis not present

## 2021-12-01 DIAGNOSIS — R0902 Hypoxemia: Secondary | ICD-10-CM | POA: Diagnosis not present

## 2021-12-01 DIAGNOSIS — Z950 Presence of cardiac pacemaker: Secondary | ICD-10-CM

## 2021-12-01 DIAGNOSIS — Z66 Do not resuscitate: Secondary | ICD-10-CM | POA: Diagnosis not present

## 2021-12-01 DIAGNOSIS — Z952 Presence of prosthetic heart valve: Secondary | ICD-10-CM

## 2021-12-01 DIAGNOSIS — N3000 Acute cystitis without hematuria: Secondary | ICD-10-CM | POA: Diagnosis not present

## 2021-12-01 DIAGNOSIS — Z951 Presence of aortocoronary bypass graft: Secondary | ICD-10-CM

## 2021-12-01 DIAGNOSIS — I442 Atrioventricular block, complete: Secondary | ICD-10-CM | POA: Diagnosis present

## 2021-12-01 DIAGNOSIS — Z79899 Other long term (current) drug therapy: Secondary | ICD-10-CM | POA: Diagnosis not present

## 2021-12-01 DIAGNOSIS — R051 Acute cough: Principal | ICD-10-CM

## 2021-12-01 DIAGNOSIS — I714 Abdominal aortic aneurysm, without rupture, unspecified: Secondary | ICD-10-CM | POA: Diagnosis not present

## 2021-12-01 DIAGNOSIS — G9341 Metabolic encephalopathy: Principal | ICD-10-CM | POA: Diagnosis present

## 2021-12-01 DIAGNOSIS — R410 Disorientation, unspecified: Secondary | ICD-10-CM | POA: Diagnosis not present

## 2021-12-01 DIAGNOSIS — Z743 Need for continuous supervision: Secondary | ICD-10-CM | POA: Diagnosis not present

## 2021-12-01 DIAGNOSIS — E876 Hypokalemia: Secondary | ICD-10-CM | POA: Diagnosis not present

## 2021-12-01 DIAGNOSIS — I480 Paroxysmal atrial fibrillation: Secondary | ICD-10-CM | POA: Diagnosis present

## 2021-12-01 DIAGNOSIS — M1990 Unspecified osteoarthritis, unspecified site: Secondary | ICD-10-CM | POA: Diagnosis not present

## 2021-12-01 DIAGNOSIS — J9811 Atelectasis: Secondary | ICD-10-CM | POA: Diagnosis not present

## 2021-12-01 DIAGNOSIS — R6889 Other general symptoms and signs: Secondary | ICD-10-CM | POA: Diagnosis not present

## 2021-12-01 DIAGNOSIS — E7800 Pure hypercholesterolemia, unspecified: Secondary | ICD-10-CM | POA: Diagnosis present

## 2021-12-01 DIAGNOSIS — Z87891 Personal history of nicotine dependence: Secondary | ICD-10-CM

## 2021-12-01 DIAGNOSIS — E785 Hyperlipidemia, unspecified: Secondary | ICD-10-CM | POA: Diagnosis not present

## 2021-12-01 DIAGNOSIS — H409 Unspecified glaucoma: Secondary | ICD-10-CM | POA: Diagnosis not present

## 2021-12-01 DIAGNOSIS — D72829 Elevated white blood cell count, unspecified: Secondary | ICD-10-CM

## 2021-12-01 DIAGNOSIS — J189 Pneumonia, unspecified organism: Secondary | ICD-10-CM | POA: Diagnosis present

## 2021-12-01 DIAGNOSIS — H9190 Unspecified hearing loss, unspecified ear: Secondary | ICD-10-CM | POA: Diagnosis not present

## 2021-12-01 DIAGNOSIS — R059 Cough, unspecified: Secondary | ICD-10-CM | POA: Diagnosis not present

## 2021-12-01 DIAGNOSIS — R627 Adult failure to thrive: Secondary | ICD-10-CM | POA: Diagnosis present

## 2021-12-01 HISTORY — DX: Paroxysmal atrial fibrillation: I48.0

## 2021-12-01 LAB — CBC WITH DIFFERENTIAL/PLATELET
Abs Immature Granulocytes: 0.08 10*3/uL — ABNORMAL HIGH (ref 0.00–0.07)
Basophils Absolute: 0 10*3/uL (ref 0.0–0.1)
Basophils Relative: 0 %
Eosinophils Absolute: 0 10*3/uL (ref 0.0–0.5)
Eosinophils Relative: 0 %
HCT: 34 % — ABNORMAL LOW (ref 39.0–52.0)
Hemoglobin: 11.6 g/dL — ABNORMAL LOW (ref 13.0–17.0)
Immature Granulocytes: 1 %
Lymphocytes Relative: 5 %
Lymphs Abs: 0.7 10*3/uL (ref 0.7–4.0)
MCH: 31.2 pg (ref 26.0–34.0)
MCHC: 34.1 g/dL (ref 30.0–36.0)
MCV: 91.4 fL (ref 80.0–100.0)
Monocytes Absolute: 0.5 10*3/uL (ref 0.1–1.0)
Monocytes Relative: 3 %
Neutro Abs: 14 10*3/uL — ABNORMAL HIGH (ref 1.7–7.7)
Neutrophils Relative %: 91 %
Platelets: 233 10*3/uL (ref 150–400)
RBC: 3.72 MIL/uL — ABNORMAL LOW (ref 4.22–5.81)
RDW: 14.6 % (ref 11.5–15.5)
WBC: 15.4 10*3/uL — ABNORMAL HIGH (ref 4.0–10.5)
nRBC: 0 % (ref 0.0–0.2)

## 2021-12-01 LAB — COMPREHENSIVE METABOLIC PANEL
ALT: 15 U/L (ref 0–44)
AST: 19 U/L (ref 15–41)
Albumin: 3.1 g/dL — ABNORMAL LOW (ref 3.5–5.0)
Alkaline Phosphatase: 50 U/L (ref 38–126)
Anion gap: 10 (ref 5–15)
BUN: 21 mg/dL (ref 8–23)
CO2: 25 mmol/L (ref 22–32)
Calcium: 8.7 mg/dL — ABNORMAL LOW (ref 8.9–10.3)
Chloride: 102 mmol/L (ref 98–111)
Creatinine, Ser: 1.11 mg/dL (ref 0.61–1.24)
GFR, Estimated: >60 mL/min (ref >60)
Glucose, Bld: 206 mg/dL — ABNORMAL HIGH (ref 70–99)
Potassium: 3.9 mmol/L (ref 3.5–5.1)
Sodium: 137 mmol/L (ref 135–145)
Total Bilirubin: 0.8 mg/dL (ref 0.3–1.2)
Total Protein: 6.2 g/dL — ABNORMAL LOW (ref 6.5–8.1)

## 2021-12-01 LAB — RESP PANEL BY RT-PCR (FLU A&B, COVID) ARPGX2
Influenza A by PCR: NEGATIVE
Influenza B by PCR: NEGATIVE
SARS Coronavirus 2 by RT PCR: NEGATIVE

## 2021-12-01 LAB — LACTIC ACID, PLASMA: Lactic Acid, Venous: 1.5 mmol/L (ref 0.5–1.9)

## 2021-12-01 MED ORDER — SODIUM CHLORIDE 0.9 % IV SOLN
500.0000 mg | Freq: Once | INTRAVENOUS | Status: AC
  Administered 2021-12-02: 500 mg via INTRAVENOUS
  Filled 2021-12-01: qty 5

## 2021-12-01 MED ORDER — SODIUM CHLORIDE 0.9 % IV SOLN
1.0000 g | Freq: Once | INTRAVENOUS | Status: AC
  Administered 2021-12-02: 1 g via INTRAVENOUS
  Filled 2021-12-01: qty 10

## 2021-12-01 NOTE — ED Provider Notes (Signed)
?[facility] ?Provider Note ? ? ?CSN: [id number] ?Arrival date & time: 12/01/21  1857 ? ?  ? ?History ? ?Chief Complaint  ?Patient presents with  ? Increased mucus/cough  ? ? ?[name] [name] is a 88 y.o. male. ? ?Patient presents to ER chief complaint of increased cough, persistent weakness.  Unable to obtain history from the patient himself reportedly has a history of meth some mental delay.  Additional history obtained from his close friend who has been his caretaker for several years now.  He states he has no other family member taking care of this patient.  This friend goes to visit him every day and helps to feed him and take care of his bills and daily activities.  They spoke with his case manager sometime ago and have been starting to look for nursing home for him unsuccessfully.  They recently placed him in hospice last week to try to get additional resources for him.  They noticed some increased coughing increased difficulty breathing and brought him to the ER today. ? ? ?  ? ?Home Medications ?Prior to Admission medications   ?Medication Sig Start Date End Date Taking? Authorizing Provider  ?acetaminophen (TYLENOL) 325 MG tablet Take 325 mg by mouth every 6 (six) hours as needed for mild pain.     [provider]  ?apixaban (ELIQUIS) 5 MG TABS tablet Take 1 tablet (5 mg total) by mouth 2 (two) times daily. 08/10/19 07/23/20  Camnitz, Ocie Doyne, MD  ?atorvastatin (LIPITOR) 10 MG tablet Take 10 mg by mouth daily.    [provider]  ?brimonidine (ALPHAGAN P) 0.1 % SOLN Place 1 drop into both eyes 2 (two) times daily.     [provider]  ?carvedilol (COREG) 12.5 MG tablet Take 1 tablet (12.5 mg total) by mouth 2 (two) times daily. 10/23/21   Shirley Friar, PA-C  ?docusate sodium (COLACE) 100 MG capsule Take 200 mg by mouth daily. ?Patient not taking: Reported on 10/23/2021    [provider]  ?furosemide (LASIX) 40 MG tablet  Take 40 mg by mouth daily. 02/10/17   [provider]  ?LUMIGAN 0.01 % SOLN Place 1 drop into both eyes at bedtime. 11/05/14   [provider]  ?polyethylene glycol powder (GLYCOLAX/MIRALAX) powder Take 17 g by mouth daily.    [provider]  ?potassium chloride SA (KLOR-CON M20) 20 MEQ tablet TAKE 1 TABLET (20 MEQ TOTAL) BY MOUTH DAILY. 05/25/17   Camnitz, Ocie Doyne, MD  ?   ? ?Allergies    ?Patient has no known allergies.   ? ?Review of Systems   ?Review of Systems  ?Unable to perform ROS: Other  ? ?Physical Exam ?Updated Vital Signs ?BP 117/77   Pulse 88   Temp 98.4 ?F (36.9 ?C) (Oral)   Resp 18   SpO2 97%  ?Physical Exam ?Constitutional:   ?   Appearance: He is well-developed.  ?HENT:  ?   Head: Normocephalic.  ?   Nose: Nose normal.  ?Eyes:  ?   Extraocular Movements: Extraocular movements intact.  ?Cardiovascular:  ?   Rate and Rhythm: Normal rate.  ?Pulmonary:  ?   Effort: Pulmonary effort is normal.  ?   Breath sounds: Rhonchi present.  ?Abdominal:  ?   Tenderness: There is no abdominal tenderness. There is no guarding or rebound.  ?Skin: ?   Coloration: Skin is not jaundiced.  ?Neurological:  ?   Mental Status: He is alert. Mental  status is at baseline.  ?   Comments: Per caregiver the patient is at his baseline.  Typically responds with "hey" to any questions addressed to him, which the caregiver states is typical for him.  ? ? ?ED Results / Procedures / Treatments   ?Labs ?(all labs ordered are listed, but only abnormal results are displayed) ?Labs Reviewed  ?CBC WITH DIFFERENTIAL/PLATELET - Abnormal; Notable for the following components:  ?    Result Value  ? WBC 15.4 (*)   ? RBC 3.72 (*)   ? Hemoglobin 11.6 (*)   ? HCT 34.0 (*)   ? Neutro Abs 14.0 (*)   ? Abs Immature Granulocytes 0.08 (*)   ? All other components within normal limits  ?RESP PANEL BY RT-PCR (FLU A&B, COVID) [id number]  ?CULTURE, BLOOD (ROUTINE X 2)  ?CULTURE, BLOOD (ROUTINE X 2)  ?COMPREHENSIVE METABOLIC PANEL   ?LACTIC ACID, PLASMA  ?LACTIC ACID, PLASMA  ?URINALYSIS, ROUTINE W REFLEX MICROSCOPIC  ? ? ?EKG ?None ? ?Radiology ?DG Chest Port 1 View ? ?Result Date: 12/01/2021 ?CLINICAL DATA:  Worsening cough. EXAM: PORTABLE CHEST 1 VIEW COMPARISON:  September 16, 2020 FINDINGS: Multiple sternal wires are seen. A dual lead AICD is noted. The heart size and mediastinal contours are within normal limits. Very mild atelectasis is seen within the bilateral lung bases. Both lungs are otherwise clear. Evidence of prior stenting of the abdominal aorta is noted. Multilevel degenerative changes seen throughout the thoracic spine. IMPRESSION: 1. Evidence of prior median sternotomy/CABG. 2. Very mild bibasilar atelectasis, without acute or active cardiopulmonary disease. Electronically Signed   By: Virgina Norfolk M.D.   On: 12/01/2021 20:02   ? ?Procedures ?Procedures  ? ? ?Medications Ordered in ED ?Medications - No data to display ? ?ED Course/ Medical Decision Making/ A&P ?  ?                        ?Medical Decision Making ?Amount and/or Complexity of Data Reviewed ?Labs: ordered. ?Radiology: ordered. ? ? ?Patient has been living and being taken care of by his best friend otherwise lives alone. ? ?I am concerned he is not able to take care of himself.  Concern for developing infection although chest x-ray today is clear. ? ?Anticipate admission to the hospitalist team for long-term placement and care.  Pending remainder of his labs. ? ? ? ? ? ? ? ?Final Clinical Impression(s) / ED Diagnoses ?Final diagnoses:  ?Acute cough  ?Leukocytosis, unspecified type  ?Failure to thrive in adult  ? ? ?Rx / DC Orders ?ED Discharge Orders   ? ? None  ? ?  ? ? ?  ?Luna Fuse, MD ?12/01/21 2316 ? ?

## 2021-12-01 NOTE — ED Triage Notes (Signed)
Pt BIB [facility] from home where he gets in home hospice care. Family that takes turn staying with pt states that he has had increased mucus/cough today concerning them. EMS reports he has a very wet/strong cough & is only oriented to self at his baseline recently. While en route to ED he was placed on 2L O2 via n/c & was sating at 94% but was 88% on RA. CBG 198, BP was 128 palpated & he has a paced rhythm.  ?

## 2021-12-02 ENCOUNTER — Encounter (HOSPITAL_COMMUNITY): Payer: Self-pay | Admitting: Internal Medicine

## 2021-12-02 DIAGNOSIS — R6250 Unspecified lack of expected normal physiological development in childhood: Secondary | ICD-10-CM | POA: Diagnosis present

## 2021-12-02 DIAGNOSIS — Z950 Presence of cardiac pacemaker: Secondary | ICD-10-CM | POA: Diagnosis not present

## 2021-12-02 DIAGNOSIS — G9341 Metabolic encephalopathy: Principal | ICD-10-CM

## 2021-12-02 DIAGNOSIS — Z7901 Long term (current) use of anticoagulants: Secondary | ICD-10-CM | POA: Diagnosis not present

## 2021-12-02 DIAGNOSIS — E7800 Pure hypercholesterolemia, unspecified: Secondary | ICD-10-CM

## 2021-12-02 DIAGNOSIS — E876 Hypokalemia: Secondary | ICD-10-CM | POA: Diagnosis present

## 2021-12-02 DIAGNOSIS — M1990 Unspecified osteoarthritis, unspecified site: Secondary | ICD-10-CM | POA: Diagnosis present

## 2021-12-02 DIAGNOSIS — J189 Pneumonia, unspecified organism: Secondary | ICD-10-CM | POA: Diagnosis present

## 2021-12-02 DIAGNOSIS — I2510 Atherosclerotic heart disease of native coronary artery without angina pectoris: Secondary | ICD-10-CM | POA: Diagnosis present

## 2021-12-02 DIAGNOSIS — Z952 Presence of prosthetic heart valve: Secondary | ICD-10-CM | POA: Diagnosis not present

## 2021-12-02 DIAGNOSIS — Z87891 Personal history of nicotine dependence: Secondary | ICD-10-CM | POA: Diagnosis not present

## 2021-12-02 DIAGNOSIS — R627 Adult failure to thrive: Secondary | ICD-10-CM | POA: Diagnosis present

## 2021-12-02 DIAGNOSIS — N390 Urinary tract infection, site not specified: Secondary | ICD-10-CM | POA: Diagnosis present

## 2021-12-02 DIAGNOSIS — I480 Paroxysmal atrial fibrillation: Secondary | ICD-10-CM | POA: Diagnosis present

## 2021-12-02 DIAGNOSIS — D72829 Elevated white blood cell count, unspecified: Secondary | ICD-10-CM | POA: Diagnosis not present

## 2021-12-02 DIAGNOSIS — Z79899 Other long term (current) drug therapy: Secondary | ICD-10-CM | POA: Diagnosis not present

## 2021-12-02 DIAGNOSIS — Z951 Presence of aortocoronary bypass graft: Secondary | ICD-10-CM | POA: Diagnosis not present

## 2021-12-02 DIAGNOSIS — Z66 Do not resuscitate: Secondary | ICD-10-CM | POA: Diagnosis present

## 2021-12-02 DIAGNOSIS — I442 Atrioventricular block, complete: Secondary | ICD-10-CM | POA: Diagnosis present

## 2021-12-02 DIAGNOSIS — Z20822 Contact with and (suspected) exposure to covid-19: Secondary | ICD-10-CM | POA: Diagnosis present

## 2021-12-02 LAB — CBC WITH DIFFERENTIAL/PLATELET
Abs Immature Granulocytes: 0.05 10*3/uL (ref 0.00–0.07)
Basophils Absolute: 0 10*3/uL (ref 0.0–0.1)
Basophils Relative: 0 %
Eosinophils Absolute: 0.1 10*3/uL (ref 0.0–0.5)
Eosinophils Relative: 1 %
HCT: 32.2 % — ABNORMAL LOW (ref 39.0–52.0)
Hemoglobin: 10.5 g/dL — ABNORMAL LOW (ref 13.0–17.0)
Immature Granulocytes: 1 %
Lymphocytes Relative: 11 %
Lymphs Abs: 1.1 10*3/uL (ref 0.7–4.0)
MCH: 30.7 pg (ref 26.0–34.0)
MCHC: 32.6 g/dL (ref 30.0–36.0)
MCV: 94.2 fL (ref 80.0–100.0)
Monocytes Absolute: 0.5 10*3/uL (ref 0.1–1.0)
Monocytes Relative: 5 %
Neutro Abs: 8 10*3/uL — ABNORMAL HIGH (ref 1.7–7.7)
Neutrophils Relative %: 82 %
Platelets: 200 10*3/uL (ref 150–400)
RBC: 3.42 MIL/uL — ABNORMAL LOW (ref 4.22–5.81)
RDW: 14.8 % (ref 11.5–15.5)
WBC: 9.7 10*3/uL (ref 4.0–10.5)
nRBC: 0 % (ref 0.0–0.2)

## 2021-12-02 LAB — URINALYSIS, ROUTINE W REFLEX MICROSCOPIC
Bilirubin Urine: NEGATIVE
Glucose, UA: NEGATIVE mg/dL
Hgb urine dipstick: NEGATIVE
Ketones, ur: NEGATIVE mg/dL
Nitrite: NEGATIVE
Protein, ur: NEGATIVE mg/dL
Specific Gravity, Urine: 1.02 (ref 1.005–1.030)
WBC, UA: >50 WBC/hpf — ABNORMAL HIGH (ref 0–5)
pH: 5 (ref 5.0–8.0)

## 2021-12-02 LAB — I-STAT VENOUS BLOOD GAS, ED
Acid-Base Excess: 6 mmol/L — ABNORMAL HIGH (ref 0.0–2.0)
Bicarbonate: 30.4 mmol/L — ABNORMAL HIGH (ref 20.0–28.0)
Calcium, Ion: 1.13 mmol/L — ABNORMAL LOW (ref 1.15–1.40)
HCT: 30 % — ABNORMAL LOW (ref 39.0–52.0)
Hemoglobin: 10.2 g/dL — ABNORMAL LOW (ref 13.0–17.0)
O2 Saturation: 82 %
Potassium: 3.8 mmol/L (ref 3.5–5.1)
Sodium: 139 mmol/L (ref 135–145)
TCO2: 32 mmol/L (ref 22–32)
pCO2, Ven: 43.6 mmHg — ABNORMAL LOW (ref 44–60)
pH, Ven: 7.45 — ABNORMAL HIGH (ref 7.25–7.43)
pO2, Ven: 44 mmHg (ref 32–45)

## 2021-12-02 LAB — COMPREHENSIVE METABOLIC PANEL
ALT: 14 U/L (ref 0–44)
AST: 16 U/L (ref 15–41)
Albumin: 2.7 g/dL — ABNORMAL LOW (ref 3.5–5.0)
Alkaline Phosphatase: 42 U/L (ref 38–126)
Anion gap: 10 (ref 5–15)
BUN: 21 mg/dL (ref 8–23)
CO2: 26 mmol/L (ref 22–32)
Calcium: 8.5 mg/dL — ABNORMAL LOW (ref 8.9–10.3)
Chloride: 104 mmol/L (ref 98–111)
Creatinine, Ser: 1.05 mg/dL (ref 0.61–1.24)
GFR, Estimated: >60 mL/min (ref >60)
Glucose, Bld: 160 mg/dL — ABNORMAL HIGH (ref 70–99)
Potassium: 3.9 mmol/L (ref 3.5–5.1)
Sodium: 140 mmol/L (ref 135–145)
Total Bilirubin: 1 mg/dL (ref 0.3–1.2)
Total Protein: 5.7 g/dL — ABNORMAL LOW (ref 6.5–8.1)

## 2021-12-02 LAB — MAGNESIUM: Magnesium: 1.7 mg/dL (ref 1.7–2.4)

## 2021-12-02 LAB — PROCALCITONIN: Procalcitonin: 0.17 ng/mL

## 2021-12-02 LAB — PHOSPHORUS: Phosphorus: 3.8 mg/dL (ref 2.5–4.6)

## 2021-12-02 LAB — LACTIC ACID, PLASMA: Lactic Acid, Venous: 1.8 mmol/L (ref 0.5–1.9)

## 2021-12-02 MED ORDER — SODIUM CHLORIDE 0.9 % IV SOLN
500.0000 mg | INTRAVENOUS | Status: DC
  Administered 2021-12-02 – 2021-12-04 (×3): 500 mg via INTRAVENOUS
  Filled 2021-12-02 (×3): qty 5

## 2021-12-02 MED ORDER — ACETAMINOPHEN 650 MG RE SUPP
650.0000 mg | Freq: Four times a day (QID) | RECTAL | Status: DC | PRN
Start: 2021-12-02 — End: 2021-12-06

## 2021-12-02 MED ORDER — ACETAMINOPHEN 325 MG PO TABS
650.0000 mg | ORAL_TABLET | Freq: Four times a day (QID) | ORAL | Status: DC | PRN
  Administered 2021-12-04: 650 mg via ORAL
  Filled 2021-12-02: qty 2

## 2021-12-02 MED ORDER — ATORVASTATIN CALCIUM 10 MG PO TABS
10.0000 mg | ORAL_TABLET | Freq: Every day | ORAL | Status: DC
  Administered 2021-12-03 – 2021-12-06 (×4): 10 mg via ORAL
  Filled 2021-12-02 (×4): qty 1

## 2021-12-02 MED ORDER — CEFTRIAXONE SODIUM 1 G IJ SOLR
1.0000 g | INTRAMUSCULAR | Status: DC
Start: 2021-12-02 — End: 2021-12-05
  Administered 2021-12-02 – 2021-12-04 (×3): 1 g via INTRAVENOUS
  Filled 2021-12-02 (×3): qty 10

## 2021-12-02 NOTE — ED Notes (Signed)
Pt care taken no complaints at this time, resting at the moment. ?

## 2021-12-02 NOTE — Assessment & Plan Note (Addendum)
#)   Paroxysmal atrial fibrillation: Documented history of such. In setting of CHA2DS2-VASc score of  3, there is an indication for chronic anticoagulation for thromboembolic prophylaxis.  ?-Patient noted to still be on Eliquis per cardiology note from  10/23/2021.  ?-Home AV nodal blocking regimen: Coreg.  Most recent echocardiogram occurred in December 2016, was notable for LVEF 60 to 65%, mild LVH, normal left ventricular cavity size, and no evidence of focal wall motion abnormalities.  ?-Patient was maintained on home regimen Coreg. ?-Patient maintained on home regimen Eliquis for anticoagulation. ?-Per RN early on in the hospitalization, patient with black stools however patient noted to be on oral iron supplementation prior to admission. ?-Patient does endorse black stools at home which he attributes to his oral iron supplementation. ?-Hemoglobin remained stable throughout the hospitalization.  ?-Patient monitored on Eliquis.  ?-Outpatient follow-up with PCP.  ?  ?

## 2021-12-02 NOTE — ED Notes (Signed)
Pt alert, NAD, calm. Family at [facility]. 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  ?

## 2021-12-02 NOTE — H&P (Signed)
?History and Physical  ? ? ?PLEASE NOTE THAT [employer]AG[employer] SOFTWARE WAS USED IN THE CONSTRUCTION OF THIS NOTE. ? ? ?[name] [name] VPX:[id number] DOB: 11/01/1929 DOA: 12/01/2021 ? ?PCP: Lajean Manes, MD  ?Patient coming from: home  ? ?I have personally briefly reviewed patient's old medical records in [place] ? ?Chief Complaint: Altered mental status ? ?HPI: [name] [name] is a 88 y.o. male with medical history significant for developmental delay, hyperlipidemia, complete heart block status post pacemaker placement in 2016, who is admitted to [facility] on 12/01/2021 with suspected acute metabolic encephalopathy after presenting from home to [facility] ED for evaluation of altered mental status. ? ?In the setting of altered mental status superimposed on developmental delay from birth, the following history is provided by the patient's caregiver as well as my discussions with the EDP and via chart review. ? ?The patient reportedly lives independently, but has a caregiver who checks on him on a daily basis in the context of a history of developmental delay from birth. ? ?Per caregiver, the patient has exhibited evidence of 3 to 4 days of progressive confusion relative to baseline mental status, coinciding with new onset nonproductive cough.  No reported recent trauma.  No known recent vomiting, diarrhea, rash.  ? ? ? ? ?ED Course:  ?Vital signs in the ED were notable for the following: Afebrile; heart rate 60-75, blood pressure 117/77; respiratory rate 15-20, oxygen saturation 90 to 91% on room air. ? ?Labs were notable for the following: CMP notable for the following: Sodium 137, bicarbonate 25, creatinine 1.11, glucose 206, calcium, adjusted for mild hypoalbuminemia noted to be 9.5, albumin 3.1, other liver enzymes within normal limits.  CBC notable for white blood cell count 15,400 with 91% neutrophils.  Lactic acid 1.5.  Urinalysis ordered, with result currently pending.  COVID-19/influenza PCR negative.   Blood cultures x2 were collected prior to initiation of IV antibiotics. ? ?Imaging and additional notable ED work-up: Chest x-ray showed bibasilar airspace opacities, felt to represent atelectasis versus infiltrate, will demonstrating no evidence of edema, effusion, or pneumothorax. ? ?While in the ED, the following were administered: Azithromycin, Rocephin. ? ?Subsequently, the patient was admitted for further evaluation and management of suspected acute metabolic encephalopathy in the setting of clinically suspected community-acquired pneumonia.  ? ? ? ? ?Review of Systems: As per HPI otherwise 10 point review of systems negative.  ? ?Past Medical History:  ?Diagnosis Date  ? AAA (abdominal aortic aneurysm)   ? Abnormal Holter monitor finding 07/09/2011  ? Runs of SVT with profound bradycardia  ? Aortic stenosis   ? s/p AVR in 2004  ? Arthritis   ? Bifascicular block   ? Complete heart block ([place]) 08/23/2015  ? MDT PPM Dr. Curt Bears   ? Coronary artery disease   ? s/p CABG in 2004  ? Hypercholesterolemia   ? On Simvastatin  ? Paroxysmal atrial fibrillation (HCC)   ? Right bundle branch block   ? Sinus bradycardia   ? Spasmodic torticollis   ? recent with persistant headaches  ? ? ?Past Surgical History:  ?Procedure Laterality Date  ? ABDOMINAL AORTIC ENDOVASCULAR STENT GRAFT N/A 08/18/2013  ? Procedure: ABDOMINAL AORTIC ENDOVASCULAR STENT GRAFT;  Surgeon: Rosetta Posner, MD;  Location: [place];  Service: Vascular;  Laterality: N/A;  ? adeniod ectomy    ? AORTIC VALVE REPLACEMENT  10-30-02  ? insertion of a bioprosthetic #81m AVR  ? CARDIAC CATHETERIZATION N/A 08/21/2015  ? Procedure: Temporary Pacemaker;  Surgeon: Sherren Mocha, MD;  Location: [place] [facility];  Service: Cardiovascular;  Laterality: N/A;  ? CORONARY ARTERY BYPASS GRAFT  10-30-02  ? x8 per Dr. Cyndia Bent with LIMA to LAD, SVG to PD, SVG to 2nd DX/1st DX, & SVG to subbranch of the 1st DX & 1st OM  ? EP IMPLANTABLE DEVICE N/A 08/23/2015  ? Procedure:  Pacemaker Implant;  Surgeon: Will Meredith Leeds, MD;  Location: [place] [facility];  Service: Cardiovascular;  Laterality: N/A;  ? INGUINAL HERNIA REPAIR Bilateral 01/15/2015  ? Procedure: LAPAROSCOPIC BILATERAL INGUINAL HERNIA REPAIR;  Surgeon: Ralene Ok, MD;  Location: [facility];  Service: General;  Laterality: Bilateral;  ? ? ?Social History: ? reports that he quit smoking about 68 years ago. His smoking use included cigarettes. He has been exposed to tobacco smoke. He quit smokeless tobacco use about 67 years ago. He reports that he does not drink alcohol and does not use drugs. ? ? ?No Known Allergies ? ?Family History  ?Problem Relation Age of Onset  ? Hypertension Mother   ? Hypertension Sister   ? ? ?Family history reviewed and not pertinent  ? ? ?Prior to Admission medications   ?Medication Sig Start Date End Date Taking? Authorizing Provider  ?acetaminophen (TYLENOL) 325 MG tablet Take 325 mg by mouth every 6 (six) hours as needed for mild pain.     [provider]  ?apixaban (ELIQUIS) 5 MG TABS tablet Take 1 tablet (5 mg total) by mouth 2 (two) times daily. 08/10/19 07/23/20  Camnitz, Ocie Doyne, MD  ?atorvastatin (LIPITOR) 10 MG tablet Take 10 mg by mouth daily.    [provider]  ?brimonidine (ALPHAGAN P) 0.1 % SOLN Place 1 drop into both eyes 2 (two) times daily.     [provider]  ?carvedilol (COREG) 12.5 MG tablet Take 1 tablet (12.5 mg total) by mouth 2 (two) times daily. 10/23/21   Shirley Friar, PA-C  ?docusate sodium (COLACE) 100 MG capsule Take 200 mg by mouth daily. ?Patient not taking: Reported on 10/23/2021    [provider]  ?furosemide (LASIX) 40 MG tablet Take 40 mg by mouth daily. 02/10/17   [provider]  ?LUMIGAN 0.01 % SOLN Place 1 drop into both eyes at bedtime. 11/05/14   [provider]  ?polyethylene glycol powder (GLYCOLAX/MIRALAX) powder Take 17 g by mouth daily.    [provider]  ?potassium chloride  SA (KLOR-CON M20) 20 MEQ tablet TAKE 1 TABLET (20 MEQ TOTAL) BY MOUTH DAILY. 05/25/17   Camnitz, Ocie Doyne, MD  ? ? ? ?Objective  ? ? ?Physical Exam: ?Vitals:  ? 12/02/21 0415 12/02/21 0430 12/02/21 0515 12/02/21 0600  ?BP: (!) 124/58 136/65 (!) 125/54 129/61  ?Pulse: 60 (!) 59 60 60  ?Resp: '15 15 15 12  '$ ?Temp:      ?TempSrc:      ?SpO2: 95% 98% 97% 97%  ? ? ?General: appears to be stated age; somnolent, confused  ?Skin: warm, dry, no rash ?Head:  AT/[place] ?Mouth:  Oral mucosa membranes appear moist, normal dentition ?Neck: supple; trachea midline ?Heart:  RRR; did not appreciate any M/R/G ?Lungs: CTAB, did not appreciate any wheezes, rales, or rhonchi ?Abdomen: + BS; soft, ND, NT ?Vascular: 2+ pedal pulses b/l; 2+ radial pulses b/l ?Extremities: no peripheral edema, no muscle wasting ?Neuro: In the setting of the patient's current mental status and associated inability to follow instructions, unable to perform full neurologic exam at this time.  As  such, assessment of strength, sensation, and cranial nerves is limited at this time. Patient noted to spontaneously move all 4 extremities. No tremors.  ? ? ? ? ?Labs on Admission: I have personally reviewed following labs and imaging studies ? ?CBC: ?Recent Labs  ?Lab 12/01/21 ?2234 12/02/21 ?0430  ?WBC 15.4* 9.7  ?NEUTROABS 14.0* 8.0*  ?HGB 11.6* 10.5*  ?HCT 34.0* 32.2*  ?MCV 91.4 94.2  ?PLT 233 200  ? ?Basic Metabolic Panel: ?Recent Labs  ?Lab 12/01/21 ?2234 12/02/21 ?0430  ?NA 137 140  ?K 3.9 3.9  ?CL 102 104  ?CO2 25 26  ?GLUCOSE 206* 160*  ?BUN 21 21  ?CREATININE 1.11 1.05  ?CALCIUM 8.7* 8.5*  ?MG  --  1.7  ?PHOS  --  3.8  ? ?GFR: ?CrCl cannot be calculated (Unknown ideal weight.). ?Liver Function Tests: ?Recent Labs  ?Lab 12/01/21 ?2234 12/02/21 ?0430  ?AST 19 16  ?ALT 15 14  ?ALKPHOS 50 42  ?BILITOT 0.8 1.0  ?PROT 6.2* 5.7*  ?ALBUMIN 3.1* 2.7*  ? ?No results for input(s): LIPASE, AMYLASE in the last 168 hours. ?No results for input(s): AMMONIA in the last 168  hours. ?Coagulation Profile: ?No results for input(s): INR, PROTIME in the last 168 hours. ?Cardiac Enzymes: ?No results for input(s): CKTOTAL, CKMB, CKMBINDEX, TROPONINI in the last 168 hours. ?BNP (last 3 resu

## 2021-12-02 NOTE — ED Notes (Signed)
Alert, NAD, calm, confused, intermittently calling out, updated, reoriented. VSS.  ?

## 2021-12-02 NOTE — ED Notes (Addendum)
Error in charting.

## 2021-12-02 NOTE — Progress Notes (Signed)
?DAY ZERO NOTE ? ? ? ?[name] [name]  QMG:[id number] DOB: 1930/08/24 DOA: 12/01/2021 ?PCP: Lajean Manes, MD ? ? ?Brief Narrative:  ? ?For full narrative please see HPI done earlier this morning by Dr. Nadara Mustard or ? ?Briefly [name] [name] is a pleasant 88 year old gentleman who presents with 3 to 4 days of confusion on baseline developmental delay concern for community-acquired pneumonia at intake. ? ?Assessment & Plan: ?  ?Principal Problem: ?  Acute metabolic encephalopathy ?Active Problems: ?  CAP (community acquired pneumonia) ?  Hypercholesterolemia ?  Paroxysmal atrial fibrillation (HCC) ? ?Acute metabolic encephalopathy, unknown etiology ?Rule out UTI versus community-acquired pneumonia, does not meet sepsis criteria ?-Patient's chest x-ray is underwhelming, will continue to monitor for clinical symptoms of pneumonia.  -Procalcitonin low but not negative.   ?-At this time we will continue antibiotics given possible evolving pneumonia.  No other complaints insofar as nausea vomiting diarrhea constipation or urinary symptoms.   ?-UA is somewhat cloudy with bacteria noted, antibiotics will cover potential UTI as well.  Culture pending ?Polypharmacy is certainly a possibility given patient's home medications including Ativan, morphine -we will hold these medications in the interim for additional 24 hours, if mental status resolves quickly with presume polypharmacy over acute infectious process ? ? ?Antimicrobials:  ?Azithromycin, ceftriaxone x5 days ? ?Subjective: ?No acute issues or events overnight ? ?Objective: ?Vitals:  ? 12/02/21 0645 12/02/21 0700 12/02/21 0715 12/02/21 0730  ?BP: 134/71 138/63 (!) 148/89 (!) 154/71  ?Pulse: 61 60 66 60  ?Resp: '11 13 16 15  '$ ?Temp:      ?TempSrc:      ?SpO2: 97% 98% 98% 98%  ? ?No intake or output data in the 24 hours ending 12/02/21 0815 ?There were no vitals filed for this visit. ? ?Examination: ? ?General:  Pleasantly resting in bed, No acute distress. ?HEENT:  Normocephalic  atraumatic.  Sclerae nonicteric, noninjected.  Extraocular movements intact bilaterally. ?Neck:  Without mass or deformity.  Trachea is midline. ?Lungs:  Clear to auscultate bilaterally without rhonchi, wheeze, or rales. ?Heart:  Regular rate and rhythm.  Without murmurs, rubs, or gallops. ?Abdomen:  Soft, nontender, nondistended.  Without guarding or rebound. ?Extremities: Without cyanosis, clubbing, edema, or obvious deformity. ?Vascular:  Dorsalis pedis and posterior tibial pulses palpable bilaterally. ?Skin:  Warm and dry, no erythema, no ulcerations. ? ? ?Data Reviewed: I have personally reviewed following labs and imaging studies ? ?CBC: ?Recent Labs  ?Lab 12/01/21 ?2234 12/02/21 ?0430  ?WBC 15.4* 9.7  ?NEUTROABS 14.0* 8.0*  ?HGB 11.6* 10.5*  ?HCT 34.0* 32.2*  ?MCV 91.4 94.2  ?PLT 233 200  ? ?Basic Metabolic Panel: ?Recent Labs  ?Lab 12/01/21 ?2234 12/02/21 ?0430  ?NA 137 140  ?K 3.9 3.9  ?CL 102 104  ?CO2 25 26  ?GLUCOSE 206* 160*  ?BUN 21 21  ?CREATININE 1.11 1.05  ?CALCIUM 8.7* 8.5*  ?MG  --  1.7  ?PHOS  --  3.8  ? ?GFR: ?CrCl cannot be calculated (Unknown ideal weight.). ?Liver Function Tests: ?Recent Labs  ?Lab 12/01/21 ?2234 12/02/21 ?0430  ?AST 19 16  ?ALT 15 14  ?ALKPHOS 50 42  ?BILITOT 0.8 1.0  ?PROT 6.2* 5.7*  ?ALBUMIN 3.1* 2.7*  ? ?No results for input(s): LIPASE, AMYLASE in the last 168 hours. ?No results for input(s): AMMONIA in the last 168 hours. ?Coagulation Profile: ?No results for input(s): INR, PROTIME in the last 168 hours. ?Cardiac Enzymes: ?No results for input(s): CKTOTAL, CKMB, CKMBINDEX, TROPONINI in the last 168 hours. ?BNP (  last 3 results) ?No results for input(s): PROBNP in the last 8760 hours. ?HbA1C: ?No results for input(s): HGBA1C in the last 72 hours. ?CBG: ?No results for input(s): GLUCAP in the last 168 hours. ?Lipid Profile: ?No results for input(s): CHOL, HDL, LDLCALC, TRIG, CHOLHDL, LDLDIRECT in the last 72 hours. ?Thyroid Function Tests: ?No results for input(s): TSH,  T4TOTAL, FREET4, T3FREE, THYROIDAB in the last 72 hours. ?Anemia Panel: ?No results for input(s): VITAMINB12, FOLATE, FERRITIN, TIBC, IRON, RETICCTPCT in the last 72 hours. ?Sepsis Labs: ?Recent Labs  ?Lab 12/01/21 ?2234 12/02/21 ?1324 12/02/21 ?0430  ?PROCALCITON  --   --  0.17  ?LATICACIDVEN 1.5 1.8  --   ? ? ?Recent Results (from the past 240 hour(s))  ?Resp Panel by RT-PCR (Flu A&B, Covid) Nasopharyngeal Swab     Status: None  ? Collection Time: 12/01/21  7:30 PM  ? Specimen: Nasopharyngeal Swab; Nasopharyngeal(NP) swabs in vial transport medium  ?Result Value Ref Range Status  ? SARS Coronavirus 2 by RT PCR NEGATIVE NEGATIVE Final  ?  Comment: (NOTE) ?SARS-CoV-2 target nucleic acids are NOT DETECTED. ? ?The SARS-CoV-2 RNA is generally detectable in upper respiratory ?specimens during the acute phase of infection. The lowest ?concentration of SARS-CoV-2 viral copies this assay can detect is ?138 copies/mL. A negative result does not preclude SARS-Cov-2 ?infection and should not be used as the sole basis for treatment or ?other patient management decisions. A negative result may occur with  ?improper specimen collection/handling, submission of specimen other ?than nasopharyngeal swab, presence of viral mutation(s) within the ?areas targeted by this assay, and inadequate number of viral ?copies(<138 copies/mL). A negative result must be combined with ?clinical observations, patient history, and epidemiological ?information. The expected result is Negative. ? ?Fact Sheet for Patients:  ?EntrepreneurPulse.com.au ? ?Fact Sheet for Healthcare Providers:  ?IncredibleEmployment.be ? ?This test is no t yet approved or cleared by the [place] FDA and  ?has been authorized for detection and/or diagnosis of SARS-CoV-2 by ?FDA under an Emergency Use Authorization (EUA). This EUA will remain  ?in effect (meaning this test can be used) for the duration of the ?COVID-19 declaration under  Section 564(b)(1) of the Act, 21 ?U.S.C.section 360bbb-3(b)(1), unless the authorization is terminated  ?or revoked sooner.  ? ? ?  ? Influenza A by PCR NEGATIVE NEGATIVE Final  ? Influenza B by PCR NEGATIVE NEGATIVE Final  ?  Comment: (NOTE) ?The Xpert Xpress SARS-CoV-2/FLU/RSV plus assay is intended as an aid ?in the diagnosis of influenza from Nasopharyngeal swab specimens and ?should not be used as a sole basis for treatment. Nasal washings and ?aspirates are unacceptable for Xpert Xpress SARS-CoV-2/FLU/RSV ?testing. ? ?Fact Sheet for Patients: ?EntrepreneurPulse.com.au ? ?Fact Sheet for Healthcare Providers: ?IncredibleEmployment.be ? ?This test is not yet approved or cleared by the [place] FDA and ?has been authorized for detection and/or diagnosis of SARS-CoV-2 by ?FDA under an Emergency Use Authorization (EUA). This EUA will remain ?in effect (meaning this test can be used) for the duration of the ?COVID-19 declaration under Section 564(b)(1) of the Act, 21 U.S.C. ?section 360bbb-3(b)(1), unless the authorization is terminated or ?revoked. ? ?Performed at [facility], [place] [address] [place], [place] ?[address] ?  ? ?Radiology Studies: ?DG Chest Port 1 View ? ?Result Date: 12/01/2021 ?CLINICAL DATA:  Worsening cough. EXAM: PORTABLE CHEST 1 VIEW COMPARISON:  September 16, 2020 FINDINGS: Multiple sternal wires are seen. A dual lead AICD is noted. The heart size and mediastinal contours are within normal  limits. Very mild atelectasis is seen within the bilateral lung bases. Both lungs are otherwise clear. Evidence of prior stenting of the abdominal aorta is noted. Multilevel degenerative changes seen throughout the thoracic spine. IMPRESSION: 1. Evidence of prior median sternotomy/CABG. 2. Very mild bibasilar atelectasis, without acute or active cardiopulmonary disease. Electronically Signed   By: Virgina Norfolk M.D.   On: 12/01/2021 20:02   ? ?Scheduled Meds: ?  atorvastatin  10 mg Oral Daily  ? ?Continuous Infusions: ? azithromycin    ? cefTRIAXone (ROCEPHIN)  IV    ? ? ? LOS: 0 days  ? ?Time spent: 4mn ? ?WLittle Ishikawa DO ?[facility] ? ?If 7PM-7AM,

## 2021-12-02 NOTE — Assessment & Plan Note (Addendum)
Patient maintained on home regimen statin. ?

## 2021-12-02 NOTE — Assessment & Plan Note (Addendum)
#)   Community-acquired pneumonia, clinical suspicion for such in the setting of 3 to 4 days of new onset cough coinciding with development of confusion, suspected to represent acute metabolic encephalopathy, with presenting labs reflecting leukocytosis of neutrophilic predominance and presenting chest x-ray showing evidence of bibasilar airspace opacities, potentially representing infiltrate versus atelectasis.  ?- Procalcitonin 0.17.   ?-COVID-19/influenza PCR negative.  ?-Blood cultures with no growth to date. ?-Patient was placed on IV Rocephin, IV azithromycin and as patient improved clinically was transitioned to oral Augmentin to complete a 7-day course of antibiotic treatment.  Patient also maintained on Mucinex and Claritin.   ?-Outpatient follow-up.   ?  ?  ?  ? ?  ?  ?  ?

## 2021-12-02 NOTE — Assessment & Plan Note (Addendum)
?#)   Acute metabolic encephalopathy:  ?-Reported 3 to 4 days of progressive confusion relative to baseline mental status, with Patient's acute encephalopathy appearing to be on the basis of physiologic stressors stemming from presenting clinically suspected community-acquired pna.  Concern also for UTI.. ?- No obvious additional contributory underlying infectious process at this time, including negative COVID-19/influenza PCR performed today.  ?  ?No additional overt metabolic/electrolyte contributions at this time.  No obvious contributory pharmacologic factors. No overt acute focal neurologic deficits to suggest a contribution from an underlying acute CVA. Seizures are also felt to be less likely.  ?-Patient placed on empiric IV antibiotics, gentle hydration and improved clinically during the hospitalization. ?-TSH within normal limits at 1.132. ?-Ammonia levels within normal limits. ?-VBG with a pH of 7.450, PCO2 of 44, PO2 of 44, bicarb of 30.4 ?-Urine cultures pending.   ?-Blood cultures with no growth to date.  ?-Fall precautions. ?-Patient initially on IV Rocephin and IV azithromycin and subsequently transition to Augmentin to complete a 7-day course of antibiotic treatment.   ?-Patient will be discharged on 3 more days of Augmentin.  ?-Patient's mental status had improved and was likely close to baseline by day of discharge. ?-Outpatient follow-up with PCP/MD at SNF.   ?  ?  ?  ?

## 2021-12-03 DIAGNOSIS — E7800 Pure hypercholesterolemia, unspecified: Secondary | ICD-10-CM

## 2021-12-03 DIAGNOSIS — D72829 Elevated white blood cell count, unspecified: Secondary | ICD-10-CM

## 2021-12-03 DIAGNOSIS — I480 Paroxysmal atrial fibrillation: Secondary | ICD-10-CM

## 2021-12-03 DIAGNOSIS — J189 Pneumonia, unspecified organism: Secondary | ICD-10-CM

## 2021-12-03 DIAGNOSIS — N390 Urinary tract infection, site not specified: Secondary | ICD-10-CM

## 2021-12-03 DIAGNOSIS — G9341 Metabolic encephalopathy: Principal | ICD-10-CM

## 2021-12-03 LAB — BASIC METABOLIC PANEL
Anion gap: 10 (ref 5–15)
BUN: 17 mg/dL (ref 8–23)
CO2: 26 mmol/L (ref 22–32)
Calcium: 8.6 mg/dL — ABNORMAL LOW (ref 8.9–10.3)
Chloride: 102 mmol/L (ref 98–111)
Creatinine, Ser: 0.98 mg/dL (ref 0.61–1.24)
GFR, Estimated: >60 mL/min (ref >60)
Glucose, Bld: 136 mg/dL — ABNORMAL HIGH (ref 70–99)
Potassium: 3.5 mmol/L (ref 3.5–5.1)
Sodium: 138 mmol/L (ref 135–145)

## 2021-12-03 LAB — CBC WITH DIFFERENTIAL/PLATELET
Abs Immature Granulocytes: 0.05 10*3/uL (ref 0.00–0.07)
Basophils Absolute: 0 10*3/uL (ref 0.0–0.1)
Basophils Relative: 0 %
Eosinophils Absolute: 0.2 10*3/uL (ref 0.0–0.5)
Eosinophils Relative: 2 %
HCT: 31.5 % — ABNORMAL LOW (ref 39.0–52.0)
Hemoglobin: 10.7 g/dL — ABNORMAL LOW (ref 13.0–17.0)
Immature Granulocytes: 1 %
Lymphocytes Relative: 11 %
Lymphs Abs: 0.9 10*3/uL (ref 0.7–4.0)
MCH: 30.5 pg (ref 26.0–34.0)
MCHC: 34 g/dL (ref 30.0–36.0)
MCV: 89.7 fL (ref 80.0–100.0)
Monocytes Absolute: 0.5 10*3/uL (ref 0.1–1.0)
Monocytes Relative: 6 %
Neutro Abs: 6.5 10*3/uL (ref 1.7–7.7)
Neutrophils Relative %: 80 %
Platelets: 219 10*3/uL (ref 150–400)
RBC: 3.51 MIL/uL — ABNORMAL LOW (ref 4.22–5.81)
RDW: 14.6 % (ref 11.5–15.5)
WBC: 8.1 10*3/uL (ref 4.0–10.5)
nRBC: 0 % (ref 0.0–0.2)

## 2021-12-03 LAB — CK: Total CK: 40 U/L — ABNORMAL LOW (ref 49–397)

## 2021-12-03 LAB — TSH: TSH: 1.132 u[IU]/mL (ref 0.350–4.500)

## 2021-12-03 LAB — AMMONIA: Ammonia: 17 umol/L (ref 9–35)

## 2021-12-03 MED ORDER — GUAIFENESIN ER 600 MG PO TB12
1200.0000 mg | ORAL_TABLET | Freq: Two times a day (BID) | ORAL | Status: DC
  Administered 2021-12-03 – 2021-12-06 (×6): 1200 mg via ORAL
  Filled 2021-12-03 (×6): qty 2

## 2021-12-03 MED ORDER — HYOSCYAMINE SULFATE SL 0.125 MG SL SUBL: 1.0000 | SUBLINGUAL_TABLET | SUBLINGUAL | Status: DC | PRN

## 2021-12-03 MED ORDER — CARVEDILOL 12.5 MG PO TABS
12.5000 mg | ORAL_TABLET | Freq: Two times a day (BID) | ORAL | Status: DC
Start: 2021-12-03 — End: 2021-12-06
  Administered 2021-12-03 – 2021-12-06 (×7): 12.5 mg via ORAL
  Filled 2021-12-03 (×7): qty 1

## 2021-12-03 MED ORDER — LORATADINE 10 MG PO TABS
10.0000 mg | ORAL_TABLET | Freq: Every day | ORAL | Status: DC
Start: 2021-12-03 — End: 2021-12-06
  Administered 2021-12-03 – 2021-12-06 (×4): 10 mg via ORAL
  Filled 2021-12-03 (×4): qty 1

## 2021-12-03 MED ORDER — APIXABAN 5 MG PO TABS
5.0000 mg | ORAL_TABLET | Freq: Two times a day (BID) | ORAL | Status: DC
  Administered 2021-12-03 – 2021-12-06 (×7): 5 mg via ORAL
  Filled 2021-12-03 (×7): qty 1

## 2021-12-03 MED ORDER — BRIMONIDINE TARTRATE 0.15 % OP SOLN
1.0000 [drp] | Freq: Two times a day (BID) | OPHTHALMIC | Status: DC
  Administered 2021-12-03 – 2021-12-06 (×6): 1 [drp] via OPHTHALMIC
  Filled 2021-12-03: qty 5

## 2021-12-03 MED ORDER — FERROUS SULFATE 325 (65 FE) MG PO TABS
325.0000 mg | ORAL_TABLET | Freq: Every day | ORAL | Status: DC
  Administered 2021-12-04 – 2021-12-06 (×3): 325 mg via ORAL
  Filled 2021-12-03 (×3): qty 1

## 2021-12-03 MED ORDER — LORAZEPAM 0.5 MG PO TABS
0.5000 mg | ORAL_TABLET | Freq: Two times a day (BID) | ORAL | Status: DC | PRN
  Administered 2021-12-03 – 2021-12-05 (×4): 0.5 mg via ORAL
  Filled 2021-12-03 (×4): qty 1

## 2021-12-03 MED ORDER — MORPHINE SULFATE 10 MG/5ML PO SOLN
0.5000 mg | Freq: Four times a day (QID) | ORAL | Status: DC | PRN
  Administered 2021-12-04: 0.5 mg via ORAL
  Filled 2021-12-03: qty 5

## 2021-12-03 NOTE — Evaluation (Signed)
Occupational Therapy Evaluation ?Patient Details ?Name: [name] [name] ?MRN: [id number] ?DOB: 06-11-1930 ?Today's Date: 12/03/2021 ? ? ?History of Present Illness 88 yo male presents to [facility] on 3/27 with coughing, increased secretions, weakness. Workup for AME, possible PNA vs UTI. PMH includes developmental delay, HLD, pacemaker 2016, AAA s/p stenting 2014, AVR 2004, CABG 2004.  ? ?Clinical Impression ?  ?Pt admitted for concerns listed above. PTA pt reported that he was fairly independent, however he had aides that assisted with bathing/dressing, and a friend/caregiver that assisted with meals and medication management. At this time, pt is requiring max A +1-2 for all ADL's and functional mobility. He requires assist to maintain balance EOB and did not attempt standing this session due to not having +2 assist. Recommending SNF level assist to maximize independence. OT will follow acutely.  ?   ? ?Recommendations for follow up therapy are one component of a multi-disciplinary discharge planning process, led by the attending physician.  Recommendations may be updated based on patient status, additional functional criteria and insurance authorization.  ? ?Follow Up Recommendations ? Skilled nursing-short term rehab (<3 hours/day)  ?  ?Assistance Recommended at Discharge Frequent or constant Supervision/Assistance  ?Patient can return home with the following Two people to help with walking and/or transfers;Two people to help with bathing/dressing/bathroom;Assistance with cooking/housework;Direct supervision/assist for medications management;Direct supervision/assist for financial management;Assist for transportation;Help with stairs or ramp for entrance ? ?  ?Functional Status Assessment ? Patient has had a recent decline in their functional status and demonstrates the ability to make significant improvements in function in a reasonable and predictable amount of time.  ?Equipment Recommendations ? None recommended by OT  ?   ?Recommendations for Other Services   ? ? ?  ?Precautions / Restrictions Precautions ?Precautions: Fall ?Precaution Comments: bilat mitts ?Restrictions ?Weight Bearing Restrictions: No  ? ?  ? ?Mobility Bed Mobility ?Overal bed mobility: Needs Assistance ?Bed Mobility: Supine to Sit, Sit to Supine ?  ?  ?Supine to sit: Max assist, HOB elevated ?Sit to supine: Max assist ?  ?  ?  ? ?Transfers ?  ?  ?  ?  ?  ?  ?  ?  ?  ?General transfer comment: Deferred due to pt requiring max A +2 for standing/transfer tasks ?  ? ?  ?Balance Overall balance assessment: Needs assistance, History of Falls ?Sitting-balance support: Bilateral upper extremity supported, Feet supported ?Sitting balance-Leahy Scale: Poor ?Sitting balance - Comments: heavy posterior lean ?  ?  ?  ?  ?  ?  ?  ?  ?  ?  ?  ?  ?  ?  ?  ?   ? ?ADL either performed or assessed with clinical judgement  ? ?ADL Overall ADL's : Needs assistance/impaired ?Eating/Feeding: Set up;Sitting ?  ?Grooming: Set up;Sitting ?  ?Upper Body Bathing: Minimal assistance;Sitting ?  ?Lower Body Bathing: Maximal assistance;+2 for safety/equipment;Sitting/lateral leans;Sit to/from stand ?  ?Upper Body Dressing : Minimal assistance;Sitting ?  ?Lower Body Dressing: Maximal assistance;+2 for safety/equipment;Sitting/lateral leans;Sit to/from stand ?  ?Toilet Transfer: Maximal assistance;+2 for safety/equipment;Stand-pivot ?  ?Toileting- Clothing Manipulation and Hygiene: Maximal assistance;+2 for safety/equipment;Sitting/lateral lean;Sit to/from stand ?  ?  ?  ?  ?General ADL Comments: Limited due to weakness, requiring up to +2 assist  ? ? ? ?Vision Baseline Vision/History: 1 Wears glasses ?Ability to See in Adequate Light: 0 Adequate ?Patient Visual Report: No change from baseline ?Vision Assessment?: No apparent visual deficits  ?   ?Perception   ?  ?  Praxis   ?  ? ?Pertinent Vitals/Pain Pain Assessment ?Pain Assessment: No/denies pain  ? ? ? ?Hand Dominance Right ?  ?Extremity/Trunk  Assessment Upper Extremity Assessment ?Upper Extremity Assessment: Generalized weakness ?  ?Lower Extremity Assessment ?Lower Extremity Assessment: Defer to PT evaluation ?  ?Cervical / Trunk Assessment ?Cervical / Trunk Assessment: Kyphotic ?Cervical / Trunk Exceptions: sacral sitting at EOB ?  ?Communication Communication ?Communication: No difficulties ?  ?Cognition   ?  ?  ?  ?  ?  ?  ?  ?  ?  ?  ?  ?  ?  ?  ?  ?  ?  ?  ?  ?  ?  ?General Comments  VSS on RA ? ?  ?Exercises   ?  ?Shoulder Instructions    ? ? ?Home Living Family/patient expects to be discharged to:: Private residence ?Living Arrangements: Alone ?Available Help at Discharge: Friend(s);Available PRN/intermittently ?Type of Home: House ?Home Access: Stairs to enter ?Entrance Stairs-Number of Steps: 4 ?Entrance Stairs-Rails: Right;Left ?Home Layout: One level ?  ?  ?Bathroom Shower/Tub: Tub/shower unit ?  ?Bathroom Toilet: Standard ?  ?  ?Home Equipment: Conservation officer, nature (2 wheels) ?  ?  ?  ? ?  ?Prior Functioning/Environment Prior Level of Function : Needs assist ?  ?  ?  ?  ?  ?  ?Mobility Comments: Per RN, pt has not ambulated since 10/22 ?ADLs Comments: Pt has HHRN 2x/week and aide 3x/week to assist with dressing/bathing. Pt had been a hospice patient, now d/c hospice ?  ? ?  ?  ?OT Problem List: Decreased strength;Decreased range of motion;Decreased activity tolerance;Impaired balance (sitting and/or standing);Decreased cognition;Decreased safety awareness;Decreased knowledge of use of DME or AE ?  ?   ?OT Treatment/Interventions: Self-care/ADL training;Therapeutic exercise;Energy conservation;DME and/or AE instruction;Therapeutic activities;Patient/family education;Balance training  ?  ?OT Goals(Current goals can be found in the care plan section) Acute Rehab OT Goals ?Patient Stated Goal: To go home ?OT Goal Formulation: With patient ?Time For Goal Achievement: 12/17/21 ?Potential to Achieve Goals: Fair ?ADL Goals ?Pt Will Perform Grooming: with  supervision;standing ?Pt Will Perform Lower Body Bathing: with supervision;sitting/lateral leans;sit to/from stand ?Pt Will Perform Lower Body Dressing: with supervision;sitting/lateral leans;sit to/from stand ?Pt Will Transfer to Toilet: with supervision;stand pivot transfer ?Pt Will Perform Toileting - Clothing Manipulation and hygiene: with supervision;sitting/lateral leans;sit to/from stand  ?OT Frequency: Min 2X/week ?  ? ?Co-evaluation   ?  ?  ?  ?  ? ?  ?AM-PAC OT "6 Clicks" Daily Activity     ?Outcome Measure Help from another person eating meals?: A Little ?Help from another person taking care of personal grooming?: A Little ?Help from another person toileting, which includes using toliet, bedpan, or urinal?: A Lot ?Help from another person bathing (including washing, rinsing, drying)?: A Lot ?Help from another person to put on and taking off regular upper body clothing?: A Little ?Help from another person to put on and taking off regular lower body clothing?: A Lot ?6 Click Score: 15 ?  ?End of Session Nurse Communication: Mobility status ? ?Activity Tolerance: Patient tolerated treatment well ?Patient left: in bed;with call bell/phone within reach;with bed alarm set ? ?OT Visit Diagnosis: Unsteadiness on feet (R26.81);Other abnormalities of gait and mobility (R26.89);Muscle weakness (generalized) (M62.81)  ?              ?Time: 2035-5974 ?OT Time Calculation (min): 11 min ?Charges:  OT General Charges ?$OT Visit: 1 Visit ?OT Evaluation ?$OT Eval  Moderate Complexity: 1 Mod ? ?Braxston Quinter H., OTR/L ?Acute Rehabilitation ? ?Jariya Reichow Elane Yolanda Bonine ?12/03/2021, 7:23 PM ?

## 2021-12-03 NOTE — Hospital Course (Signed)
For full narrative please see HPI done earlier this morning by Dr. Nadara Mustard or ?  ?Briefly [name] [name] is a pleasant 88 year old gentleman who presents with 3 to 4 days of confusion on baseline developmental delay concern for community-acquired pneumonia at intake. ?  ?

## 2021-12-03 NOTE — Evaluation (Signed)
Physical Therapy Evaluation ?Patient Details ?Name: [name] [name] ?MRN: [id number] ?DOB: 1930-07-24 ?Today's Date: 12/03/2021 ? ?History of Present Illness ? 88 yo male presents to [facility] on 3/27 with coughing, increased secretions, weakness. Workup for AME, possible PNA vs UTI. PMH includes developmental delay, HLD, pacemaker 2016, AAA s/p stenting 2014, AVR 2004, CABG 2004.  ?Clinical Impression ?  ?Pt presents with debility, impaired sitting and standing balance, altered mental status vs baseline, and decreased activity tolerance. Pt to benefit from acute PT to address deficits. Pt requiring max +2 for repeated stands at EOB, pt limited by heavy posterior leaning and anxiety in standing. PT recommending SNF level of care post-acutely to address deficits. PT to progress mobility as tolerated, and will continue to follow acutely.  ?   ?   ? ?Recommendations for follow up therapy are one component of a multi-disciplinary discharge planning process, led by the attending physician.  Recommendations may be updated based on patient status, additional functional criteria and insurance authorization. ? ?Follow Up Recommendations Skilled nursing-short term rehab (<3 hours/day) ? ?  ?Assistance Recommended at Discharge Frequent or constant Supervision/Assistance  ?Patient can return home with the following ? A lot of help with walking and/or transfers;A lot of help with bathing/dressing/bathroom;Assistance with feeding;Direct supervision/assist for medications management;Assist for transportation;Help with stairs or ramp for entrance;Direct supervision/assist for financial management;Assistance with cooking/housework ? ?  ?Equipment Recommendations None recommended by PT  ?Recommendations for Other Services ?    ?  ?Functional Status Assessment Patient has had a recent decline in their functional status and/or demonstrates limited ability to make significant improvements in function in a reasonable and predictable amount of time   ? ?  ?Precautions / Restrictions Precautions ?Precautions: Fall ?Precaution Comments: bilat mitts ?Restrictions ?Weight Bearing Restrictions: No  ? ?  ? ?Mobility ? Bed Mobility ?Overal bed mobility: Needs Assistance ?Bed Mobility: Supine to Sit, Sit to Supine, Rolling ?Rolling: Mod assist ?  ?Supine to sit: Max assist, HOB elevated ?Sit to supine: Max assist, +2 for physical assistance, HOB elevated ?  ?General bed mobility comments: max +1-2 for supine<>sit for trunk and LE management, scooting to EOB as pt very resistant given heavy posterior bias and fear of falling, and boost up in bed upon return to supine. ?  ? ?Transfers ?Overall transfer level: Needs assistance ?Equipment used: 2 person hand held assist, Rolling walker (2 wheels) ?Transfers: Sit to/from Stand ?Sit to Stand: Max assist ?  ?  ?  ?  ?  ?General transfer comment: max assist for power up, rise, steadying, and correcting heavy posterior bias. Pt never completely stands over BOS given heavy posterior preference. Attempts x4, x1 full stand and rest are semi-stands ?  ? ?Ambulation/Gait ?  ?  ?  ?  ?  ?  ?  ?General Gait Details: nt ? ?Stairs ?  ?  ?  ?  ?  ? ?Wheelchair Mobility ?  ? ?Modified Rankin (Stroke Patients Only) ?  ? ?  ? ?Balance Overall balance assessment: Needs assistance, History of Falls ?Sitting-balance support: Bilateral upper extremity supported, Feet supported ?Sitting balance-Leahy Scale: Poor ?Sitting balance - Comments: heavy posterior lean ?Postural control: Posterior lean ?Standing balance support: Bilateral upper extremity supported, During functional activity ?Standing balance-Leahy Scale: Zero ?Standing balance comment: max +2 ?  ?  ?  ?  ?  ?  ?  ?  ?  ?  ?  ?   ? ? ? ?Pertinent Vitals/Pain Pain Assessment ?Pain Assessment: No/denies  pain  ? ? ?Home Living Family/patient expects to be discharged to:: Private residence ?Living Arrangements: Alone ?Available Help at Discharge: Friend(s);Available PRN/intermittently ?Type  of Home: House ?Home Access: Stairs to enter ?Entrance Stairs-Rails: Right;Left ?Entrance Stairs-Number of Steps: 4 ?  ?Home Layout: One level ?Home Equipment: Conservation officer, nature (2 wheels) ?   ?  ?Prior Function Prior Level of Function : Needs assist ?  ?  ?  ?  ?  ?  ?Mobility Comments: above information obtained from previous visits and chart review, pt is a poor historian. Per RN, pt has been bed vs transfer level only since October 2022, pt's friend is his caregiver and comes over daily but does not live with him ?ADLs Comments: Pt has HHRN 2x/week and aide 3x/week to assist with dressing/bathing. Pt had been a hospice patient, now d/c hospice ?  ? ? ?Hand Dominance  ? Dominant Hand: Right ? ?  ?Extremity/Trunk Assessment  ? Upper Extremity Assessment ?Upper Extremity Assessment: Defer to OT evaluation ?  ? ?Lower Extremity Assessment ?Lower Extremity Assessment: Generalized weakness;Difficult to assess due to impaired cognition (formal MMT inappropriate given cognition; full ROM heel slides observed and pt able to perform 1/2 ROM SLR bilat) ?  ? ?Cervical / Trunk Assessment ?Cervical / Trunk Assessment: Kyphotic;Other exceptions ?Cervical / Trunk Exceptions: sacral sitting at EOB  ?Communication  ? Communication: No difficulties  ?Cognition Arousal/Alertness: Awake/alert ?Behavior During Therapy: [facility] for tasks assessed/performed, Anxious ?Overall Cognitive Status: History of cognitive impairments - at baseline ?Area of Impairment: Orientation ?  ?  ?  ?  ?  ?  ?  ?  ?Orientation Level: Disoriented to, Place, Time, Situation ?  ?  ?  ?  ?  ?  ?General Comments: Pt with baseline of developmental delay, unsure of true baseline cognition as no family/friends present during eval. Pt knows self only, states year is "19-something" and has no clue where he is or why. Pt increasingly anxious once sitting EOB, suspect fear of falling. ?  ?  ? ?  ?General Comments   ? ?  ?Exercises    ? ?Assessment/Plan  ?  ?PT Assessment  Patient needs continued PT services  ?PT Problem List Decreased strength;Decreased mobility;Decreased safety awareness;Decreased activity tolerance;Decreased balance;Decreased knowledge of use of DME;Decreased cognition;Decreased knowledge of precautions ? ?   ?  ?PT Treatment Interventions DME instruction;Therapeutic activities;Gait training;Therapeutic exercise;Patient/family education;Balance training;Functional mobility training;Neuromuscular re-education   ? ?PT Goals (Current goals can be found in the Care Plan section)  ?Acute Rehab PT Goals ?PT Goal Formulation: Patient unable to participate in goal setting ?Time For Goal Achievement: 12/17/21 ?Potential to Achieve Goals: Fair ? ?  ?Frequency Min 2X/week ?  ? ? ?Co-evaluation   ?  ?  ?  ?  ? ? ?  ?AM-PAC PT "6 Clicks" Mobility  ?Outcome Measure Help needed turning from your back to your side while in a flat bed without using bedrails?: A Lot ?Help needed moving from lying on your back to sitting on the side of a flat bed without using bedrails?: A Lot ?Help needed moving to and from a bed to a chair (including a wheelchair)?: Total ?Help needed standing up from a chair using your arms (e.g., wheelchair or bedside chair)?: Total ?Help needed to walk in hospital room?: Total ?Help needed climbing 3-5 steps with a railing? : Total ?6 Click Score: 8 ? ?  ?End of Session Equipment Utilized During Treatment: Gait belt ?Activity Tolerance: Other (comment);Patient limited by  fatigue (anxiety) ?Patient left: in bed;with call bell/phone within reach;with bed alarm set;Other (comment) (bilat mitts) ?Nurse Communication: Mobility status ?PT Visit Diagnosis: Other abnormalities of gait and mobility (R26.89);Difficulty in walking, not elsewhere classified (R26.2) ?  ? ?Time: 3267-1245 ?PT Time Calculation (min) (ACUTE ONLY): 20 min ? ? ?Charges:   PT Evaluation ?$PT Eval Low Complexity: 1 Low ?  ?  ?   ? ?Stacie Glaze, PT DPT ?Acute Rehabilitation Services ?Pager [phone]   ?Office [phone] ? ? ?Urian Martenson E Stroup ?12/03/2021, 4:22 PM ? ?
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
## 2021-12-03 NOTE — Assessment & Plan Note (Addendum)
-   Urinalysis on admission concerning for UTI.   ?-Urine cultures ordered and pending at time of discharge.   ?-Patient received 3 to 4 days of IV Rocephin and was transitioned to oral Augmentin.   ?-Outpatient follow-up.   ?

## 2021-12-03 NOTE — Progress Notes (Addendum)
?PROGRESS NOTE ? ? ? ?[name] [name]  WNU:[id number] DOB: [birth year] DOA: 12/01/2021 ?PCP: Lajean Manes, MD  ? ? ?Chief Complaint  ?Patient presents with  ? Increased mucus/cough  ? ? ?Brief Narrative:  ?For full narrative please see HPI done earlier this morning by Dr. Nadara Mustard or ?  ?Briefly [name] [name] is a pleasant 88 year old gentleman who presents with 3 to 4 days of confusion on baseline developmental delay concern for community-acquired pneumonia at intake. ?   ? ? ?Assessment & Plan: ? Principal Problem: ?  Acute metabolic encephalopathy ?Active Problems: ?  CAP (community acquired pneumonia) ?  Hypercholesterolemia ?  Paroxysmal atrial fibrillation (HCC) ?  Leukocytosis ?  Urinary tract infection without hematuria ?  UTI (urinary tract infection) ? ? ? ?Assessment and Plan: ?* Acute metabolic encephalopathy ?  ?#) Acute metabolic encephalopathy:  ?-Reported 3 to 4 days of progressive confusion relative to baseline mental status, with Patient's acute encephalopathy appearing to be on the basis of physiologic stressors stemming from presenting clinically suspected community-acquired pna.  Concern also for UTI.. ?- No obvious additional contributory underlying infectious process at this time, including negative COVID-19/influenza PCR performed today.  ?  ?No additional overt metabolic/electrolyte contributions at this time.  No obvious contributory pharmacologic factors. No overt acute focal neurologic deficits to suggest a contribution from an underlying acute CVA. Seizures are also felt to be less likely. Will check VBG to evaluate for any contribution from hypercapnic encephalopathy.  ?-Patient seems to be slowly improving clinically. ?-TSH within normal limits at 1.132. ?-Ammonia levels within normal limits. ?-VBG with a pH of 7.450, PCO2 of 44, PO2 of 44, bicarb of 30.4 ?-Check urine cultures. ?-Fall precautions. ?-Continue empiric IV Rocephin and azithromycin. ?-Supportive care. ?  ?  ?  ? ?UTI (urinary tract  infection) ?- Check urine cultures. ?-Continue empiric IV Rocephin pending culture results ? ?Paroxysmal atrial fibrillation (HCC) ?#) Paroxysmal atrial fibrillation: Documented history of such. In setting of CHA2DS2-VASc score of  3, there is an indication for chronic anticoagulation for thromboembolic prophylaxis.  ?-Patient noted to still be on Eliquis per cardiology note from  10/23/2021.  ?-Home AV nodal blocking regimen: Coreg.  Most recent echocardiogram occurred in December 2016, was notable for LVEF 60 to 65%, mild LVH, normal left ventricular cavity size, and no evidence of focal wall motion abnormalities.  ?-Resume home regimen Coreg. ?-Resume Eliquis for anticoagulation. ?-Per RN patient with black stools however patient noted to be on oral iron supplementation prior to admission. ?-Patient does endorse black stools at home which he attributes to his oral iron supplementation. ?-Hemoglobin stable. ?-Check FOBT. ?-Monitor closely on Eliquis. ?  ? ?Hypercholesterolemia ? ? -Continue home regimen statin. ?  ?  ?  ? ?CAP (community acquired pneumonia) ?#) Community-acquired pneumonia, clinical suspicion for such in the setting of 3 to 4 days of new onset cough coinciding with development of confusion, suspected to represent acute metabolic encephalopathy, with presenting labs reflecting leukocytosis of neutrophilic predominance and presenting chest x-ray showing evidence of bibasilar airspace opacities, potentially representing infiltrate versus atelectasis.  ?- Procalcitonin 0.17.   ?-COVID-19/influenza PCR negative.  ?-Blood cultures pending with no growth to date. ?-Continue empiric IV Rocephin, IV azithromycin. ?-Add Mucinex, Claritin. ? ?  ?  ?  ? ?  ?  ?  ? ? ? ? ?  ? ? ?DVT prophylaxis: Eliquis ?Code Status: DNR ?Family Communication: Updated patient.  No family at bedside.  Tried calling POA, [name] [name] however no response on  the telephone. ?Disposition: SNF versus home with home health ? ?Status is:  Inpatient ?Remains inpatient appropriate because: Severity of illness ?  ?Consultants:  ?None ? ?Procedures:  ?Chest x-ray 12/01/2021 ? ? ?Antimicrobials:  ?IV Rocephin 12/02/2021>>>> ?IV azithromycin 12/01/2021>>> ? ? ?Subjective: ?Patient sitting up in bed.  Denies any chest pain.  No shortness of breath.  No abdominal pain.  Tolerating current diet.  States usually at home when he has bowel movement is usually black.  Per RN patient with black tarry stools early on this morning. ? ?Objective: ?Vitals:  ? 12/03/21 0334 12/03/21 0803 12/03/21 1147 12/03/21 1605  ?BP: (!) 165/64 (!) 189/81 (!) 144/69 137/72  ?Pulse: 63 61 66 92  ?Resp: '17 18 18 18  '$ ?Temp: 97.7 ?F (36.5 ?C) 97.9 ?F (36.6 ?C) 98.4 ?F (36.9 ?C) 98.2 ?F (36.8 ?C)  ?TempSrc:      ?SpO2: 95% 95% 96% 100%  ? ? ?Intake/Output Summary (Last 24 hours) at 12/03/2021 1720 ?Last data filed at 12/03/2021 1600 ?Gross per 24 hour  ?Intake 249.17 ml  ?Output 650 ml  ?Net -400.83 ml  ? ?There were no vitals filed for this visit. ? ?Examination: ? ?General exam: Appears calm and comfortable.  Dry mucous membranes ?Respiratory system: Clear to auscultation.  No wheezes, no crackles, no rhonchi.  Respiratory effort normal. ?Cardiovascular system: S1 & S2 heard, RRR. No JVD, murmurs, rubs, gallops or clicks. No pedal edema. ?Gastrointestinal system: Abdomen is nondistended, soft and nontender. No organomegaly or masses felt. Normal bowel sounds heard. ?Central nervous system: Alert and oriented. No focal neurological deficits. ?Extremities: Symmetric 5 x 5 power. ?Skin: No rashes, lesions or ulcers ?Psychiatry: Judgement and insight appear normal. Mood & affect appropriate.  ? ? ? ?Data Reviewed:  ? ?CBC: ?Recent Labs  ?Lab 12/01/21 ?2234 12/02/21 ?0430 12/02/21 ?1031 12/03/21 ?0604  ?WBC 15.4* 9.7  --  8.1  ?NEUTROABS 14.0* 8.0*  --  6.5  ?HGB 11.6* 10.5* 10.2* 10.7*  ?HCT 34.0* 32.2* 30.0* 31.5*  ?MCV 91.4 94.2  --  89.7  ?PLT 233 200  --  219  ? ? ?Basic Metabolic  Panel: ?Recent Labs  ?Lab 12/01/21 ?2234 12/02/21 ?0430 12/02/21 ?1031 12/03/21 ?0604  ?NA 137 140 139 138  ?K 3.9 3.9 3.8 3.5  ?CL 102 104  --  102  ?CO2 25 26  --  26  ?GLUCOSE 206* 160*  --  136*  ?BUN 21 21  --  17  ?CREATININE 1.11 1.05  --  0.98  ?CALCIUM 8.7* 8.5*  --  8.6*  ?MG  --  1.7  --   --   ?PHOS  --  3.8  --   --   ? ? ?GFR: ?CrCl cannot be calculated (Unknown ideal weight.). ? ?Liver Function Tests: ?Recent Labs  ?Lab 12/01/21 ?2234 12/02/21 ?0430  ?AST 19 16  ?ALT 15 14  ?ALKPHOS 50 42  ?BILITOT 0.8 1.0  ?PROT 6.2* 5.7*  ?ALBUMIN 3.1* 2.7*  ? ? ?CBG: ?No results for input(s): GLUCAP in the last 168 hours. ? ? ?Recent Results (from the past 240 hour(s))  ?Resp Panel by RT-PCR (Flu A&B, Covid) Nasopharyngeal Swab     Status: None  ? Collection Time: 12/01/21  7:30 PM  ? Specimen: Nasopharyngeal Swab; Nasopharyngeal(NP) swabs in vial transport medium  ?Result Value Ref Range Status  ? SARS Coronavirus 2 by RT PCR NEGATIVE NEGATIVE Final  ?  Comment: (NOTE) ?SARS-CoV-2 target nucleic acids are NOT DETECTED. ? ?The SARS-CoV-2  RNA is generally detectable in upper respiratory ?specimens during the acute phase of infection. The lowest ?concentration of SARS-CoV-2 viral copies this assay can detect is ?138 copies/mL. A negative result does not preclude SARS-Cov-2 ?infection and should not be used as the sole basis for treatment or ?other patient management decisions. A negative result may occur with  ?improper specimen collection/handling, submission of specimen other ?than nasopharyngeal swab, presence of viral mutation(s) within the ?areas targeted by this assay, and inadequate number of viral ?copies(<138 copies/mL). A negative result must be combined with ?clinical observations, patient history, and epidemiological ?information. The expected result is Negative. ? ?Fact Sheet for Patients:  ?EntrepreneurPulse.com.au ? ?Fact Sheet for Healthcare Providers:   ?IncredibleEmployment.be ? ?This test is no t yet approved or cleared by the [place] FDA and  ?has been authorized for detection and/or diagnosis of SARS-CoV-2 by ?FDA under an Emergency Use Authorization (EUA). This

## 2021-12-03 NOTE — Plan of Care (Signed)

## 2021-12-04 DIAGNOSIS — E876 Hypokalemia: Secondary | ICD-10-CM | POA: Clinically undetermined

## 2021-12-04 LAB — BASIC METABOLIC PANEL
Anion gap: 10 (ref 5–15)
BUN: 13 mg/dL (ref 8–23)
CO2: 29 mmol/L (ref 22–32)
Calcium: 8.5 mg/dL — ABNORMAL LOW (ref 8.9–10.3)
Chloride: 100 mmol/L (ref 98–111)
Creatinine, Ser: 0.85 mg/dL (ref 0.61–1.24)
GFR, Estimated: >60 mL/min (ref >60)
Glucose, Bld: 146 mg/dL — ABNORMAL HIGH (ref 70–99)
Potassium: 3.2 mmol/L — ABNORMAL LOW (ref 3.5–5.1)
Sodium: 139 mmol/L (ref 135–145)

## 2021-12-04 LAB — CBC WITH DIFFERENTIAL/PLATELET
Abs Immature Granulocytes: 0.03 10*3/uL (ref 0.00–0.07)
Basophils Absolute: 0 10*3/uL (ref 0.0–0.1)
Basophils Relative: 0 %
Eosinophils Absolute: 0.2 10*3/uL (ref 0.0–0.5)
Eosinophils Relative: 3 %
HCT: 30.9 % — ABNORMAL LOW (ref 39.0–52.0)
Hemoglobin: 10.5 g/dL — ABNORMAL LOW (ref 13.0–17.0)
Immature Granulocytes: 1 %
Lymphocytes Relative: 17 %
Lymphs Abs: 1 10*3/uL (ref 0.7–4.0)
MCH: 30.6 pg (ref 26.0–34.0)
MCHC: 34 g/dL (ref 30.0–36.0)
MCV: 90.1 fL (ref 80.0–100.0)
Monocytes Absolute: 0.4 10*3/uL (ref 0.1–1.0)
Monocytes Relative: 7 %
Neutro Abs: 4.5 10*3/uL (ref 1.7–7.7)
Neutrophils Relative %: 72 %
Platelets: 205 10*3/uL (ref 150–400)
RBC: 3.43 MIL/uL — ABNORMAL LOW (ref 4.22–5.81)
RDW: 14.6 % (ref 11.5–15.5)
WBC: 6.2 10*3/uL (ref 4.0–10.5)
nRBC: 0 % (ref 0.0–0.2)

## 2021-12-04 LAB — MAGNESIUM: Magnesium: 1.7 mg/dL (ref 1.7–2.4)

## 2021-12-04 MED ORDER — SENNOSIDES-DOCUSATE SODIUM 8.6-50 MG PO TABS
1.0000 | ORAL_TABLET | Freq: Two times a day (BID) | ORAL | Status: DC
  Administered 2021-12-04 – 2021-12-05 (×2): 1 via ORAL
  Filled 2021-12-04 (×3): qty 1

## 2021-12-04 MED ORDER — MAGNESIUM SULFATE 2 GM/50ML IV SOLN
2.0000 g | Freq: Once | INTRAVENOUS | Status: AC
  Administered 2021-12-04: 2 g via INTRAVENOUS
  Filled 2021-12-04: qty 50

## 2021-12-04 MED ORDER — GUAIFENESIN-DM 100-10 MG/5ML PO SYRP
5.0000 mL | ORAL_SOLUTION | ORAL | Status: DC | PRN
Start: 2021-12-04 — End: 2021-12-06
  Administered 2021-12-04 – 2021-12-06 (×3): 5 mL via ORAL
  Filled 2021-12-04 (×3): qty 5

## 2021-12-04 MED ORDER — POTASSIUM CHLORIDE CRYS ER 20 MEQ PO TBCR
40.0000 meq | EXTENDED_RELEASE_TABLET | Freq: Once | ORAL | Status: AC
Start: 2021-12-04 — End: 2021-12-04
  Administered 2021-12-04: 40 meq via ORAL
  Filled 2021-12-04: qty 2

## 2021-12-04 MED ORDER — SORBITOL 70 % SOLN
30.0000 mL | Freq: Once | Status: AC
  Administered 2021-12-04: 30 mL via ORAL
  Filled 2021-12-04: qty 30

## 2021-12-04 NOTE — TOC Progression Note (Signed)
Transition of Care (TOC) - Progression Note  ? ? ?Patient Details  ?Name: [name] [name] ?MRN: [id number] ?Date of Birth: September 27, 1929 ? ?Transition of Care (TOC) CM/SW Contact  ?Pollie Friar, RN ?Phone Number: ?12/04/2021, 11:33 AM ? ?Clinical Narrative:    ?[facility] has offered patient a bed. CM has asked Cm MOA to start insurance authorization for a tomorrow d/c. Cm has updated the patients caregiver and POA.  ?ToC following. ? ? ?Expected Discharge Plan: [place] ?Barriers to Discharge: Continued Medical Work up ? ?Expected Discharge Plan and Services ?Expected Discharge Plan: [place] ?  ?Discharge Planning Services: CM Consult ?Post Acute Care Choice: [place] ?Living arrangements for the past 2 months: [place] ?                ?  ?  ?  ?  ?  ?  ?  ?  ?  ?  ? ? ?Social Determinants of Health (SDOH) Interventions ?  ? ?Readmission Risk Interventions ?   ? View : No data to display.  ?  ?  ?  ? ? ?

## 2021-12-04 NOTE — Assessment & Plan Note (Addendum)
-   Likely secondary to probable clinical pneumonia and UTI. ?-Blood cultures negative x5 days. ?-Urine cultures pending. ?-Leukocytosis trended down. ?-Was on IV Rocephin and IV azithromycin and subsequently transition to Augmentin and patient be discharged on 3 more days of Augmentin to complete a 7-day course of antibiotic treatment.   ?

## 2021-12-04 NOTE — Progress Notes (Signed)
Re: [name] [name] ?DOB: 16-Mar-1930 ?Date: 12/04/2021 ? ? ?To Whom It May Concern: ? ?Please be advised that the above-named patient will require a short-term nursing home stay--anticipated 30 days or less for rehabilitation and strengthening. The plan is for home.  ?

## 2021-12-04 NOTE — Progress Notes (Addendum)
?PROGRESS NOTE ? ? ? ?[name] [name]  DEY:[id number] DOB: 1930/05/16 DOA: 12/01/2021 ?PCP: Lajean Manes, MD  ? ? ?Chief Complaint  ?Patient presents with  ? Increased mucus/cough  ? ? ?Brief Narrative:  ?For full narrative please see HPI done earlier this morning by Dr. Nadara Mustard or ?  ?Briefly Mr. Kindley is a pleasant 88 year old gentleman who presents with 3 to 4 days of confusion on baseline developmental delay concern for community-acquired pneumonia at intake. ?   ? ? ?Assessment & Plan: ? Principal Problem: ?  Acute metabolic encephalopathy ?Active Problems: ?  CAP (community acquired pneumonia) ?  Hypercholesterolemia ?  Paroxysmal atrial fibrillation (HCC) ?  Leukocytosis ?  Urinary tract infection without hematuria ?  UTI (urinary tract infection) ?  Hypokalemia ? ? ? ?Assessment and Plan: ?* Acute metabolic encephalopathy ?  ?#) Acute metabolic encephalopathy:  ?-Reported 3 to 4 days of progressive confusion relative to baseline mental status, with Patient's acute encephalopathy appearing to be on the basis of physiologic stressors stemming from presenting clinically suspected community-acquired pna.  Concern also for UTI.. ?- No obvious additional contributory underlying infectious process at this time, including negative COVID-19/influenza PCR performed today.  ?  ?No additional overt metabolic/electrolyte contributions at this time.  No obvious contributory pharmacologic factors. No overt acute focal neurologic deficits to suggest a contribution from an underlying acute CVA. Seizures are also felt to be less likely.  ?-Patient seems to be slowly improving clinically. ?-TSH within normal limits at 1.132. ?-Ammonia levels within normal limits. ?-VBG with a pH of 7.450, PCO2 of 44, PO2 of 44, bicarb of 30.4 ?-Urine cultures pending.   ?-Blood cultures with no growth to date.  ?-Fall precautions. ?-Continue empiric IV Rocephin and azithromycin. ?-Supportive care. ?  ?  ?  ? ?Hypokalemia ?- Patient with a  potassium of 3.2 this morning. ?-Magnesium noted at 1.7. ?-Magnesium sulfate 2 g IV x1. ?-K dur 40 mEq p.o. x1. ? ?UTI (urinary tract infection) ?- Urine cultures pending.   ?-Continue IV Rocephin. ? ?Leukocytosis ?- Likely secondary to probable clinical pneumonia and UTI. ?-Blood cultures pending. ?-Urine cultures pending. ?-Leukocytosis has trended down. ?-Continue IV antibiotics for another 24 hours and likely could transition to oral antibiotics to complete a 5 to 7-day course of treatment. ? ?Paroxysmal atrial fibrillation ([place]) ?#) Paroxysmal atrial fibrillation: Documented history of such. In setting of CHA2DS2-VASc score of  3, there is an indication for chronic anticoagulation for thromboembolic prophylaxis.  ?-Patient noted to still be on Eliquis per cardiology note from  10/23/2021.  ?-Home AV nodal blocking regimen: Coreg.  Most recent echocardiogram occurred in December 2016, was notable for LVEF 60 to 65%, mild LVH, normal left ventricular cavity size, and no evidence of focal wall motion abnormalities.  ?-Continue home regimen Coreg. ?-Continue Eliquis for anticoagulation. ?-Per RN patient with black stools however patient noted to be on oral iron supplementation prior to admission. ?-Patient does endorse black stools at home which he attributes to his oral iron supplementation. ?-Hemoglobin stable. ?-FOBT ordered and pending. ?-Monitor closely on Eliquis. ?  ? ?Hypercholesterolemia ? ? -Continue home regimen statin. ?  ?  ?  ? ?CAP (community acquired pneumonia) ?#) Community-acquired pneumonia, clinical suspicion for such in the setting of 3 to 4 days of new onset cough coinciding with development of confusion, suspected to represent acute metabolic encephalopathy, with presenting labs reflecting leukocytosis of neutrophilic predominance and presenting chest x-ray showing evidence of bibasilar airspace opacities, potentially representing infiltrate versus atelectasis.  ?-  Procalcitonin 0.17.    ?-COVID-19/influenza PCR negative.  ?-Blood cultures pending with no growth to date. ?-Continue empiric IV Rocephin, IV azithromycin and likely transition to oral antibiotics in the next 24 hours. ?-Continue Mucinex, Claritin. ? ?  ?  ?  ? ?  ?  ?  ? ? ? ? ?  ? ? ?DVT prophylaxis: Eliquis ?Code Status: DNR ?Family Communication: Updated patient.  Updated POA Joaquim Lai [place]) on the telephone. ?Disposition: SNF versus home with home health ? ?Status is: Inpatient ?Remains inpatient appropriate because: Severity of illness ?  ?Consultants:  ?None ? ?Procedures:  ?Chest x-ray 12/01/2021 ? ? ?Antimicrobials:  ?IV Rocephin 12/02/2021>>>> ?IV azithromycin 12/01/2021>>> ? ? ?Subjective: ?Patient sitting up in bed with mittens on.  No chest pain.  No shortness of breath.  No abdominal pain.  States having difficulty getting his bowels to move.  Noted to have had a bowel movement yesterday per RN however patient states it was small.   ? ?Objective: ?Vitals:  ? 12/04/21 0423 12/04/21 0731 12/04/21 1128 12/04/21 1533  ?BP: (!) 171/64 (!) 157/78 131/63 (!) 154/62  ?Pulse: 61 60 69 (!) 59  ?Resp: '16 20 18 18  '$ ?Temp: 98 ?F (36.7 ?C) 97.9 ?F (36.6 ?C) 98.6 ?F (37 ?C) 98.7 ?F (37.1 ?C)  ?TempSrc: Oral Oral    ?SpO2: 95% 95% 100% 95%  ?Weight:      ? ? ?Intake/Output Summary (Last 24 hours) at 12/04/2021 1759 ?Last data filed at 12/04/2021 1503 ?Gross per 24 hour  ?Intake 400 ml  ?Output --  ?Net 400 ml  ? ?Filed Weights  ? 12/04/21 0356  ?Weight: 62.8 kg  ? ? ?Examination: ? ?General exam: Appears calm and comfortable.  Dry mucous membranes.  In mittens. ?Respiratory system: Clear to auscultation bilaterally anterior lung fields.  No wheezes, no crackles, no rhonchi.  Normal respiratory effort.  Speaking in full sentences.  No use of accessory muscles of respiration  ?Cardiovascular system: Regular rate rhythm no murmurs rubs or gallops.  No JVD.  No lower extremity edema.  ?Gastrointestinal system: Abdomen is mildly distended,  soft, nontender to palpation.  Positive bowel sounds.  No rebound.  No guarding.  ?Central nervous system: Alert and oriented. No focal neurological deficits. ?Extremities: Symmetric 5 x 5 power. ?Skin: No rashes, lesions or ulcers ?Psychiatry: Judgement and insight appear fair. Mood & affect appropriate.  ? ? ? ?Data Reviewed:  ? ?CBC: ?Recent Labs  ?Lab 12/01/21 ?2234 12/02/21 ?0430 12/02/21 ?1031 12/03/21 ?0604 12/04/21 ?0416  ?WBC 15.4* 9.7  --  8.1 6.2  ?NEUTROABS 14.0* 8.0*  --  6.5 4.5  ?HGB 11.6* 10.5* 10.2* 10.7* 10.5*  ?HCT 34.0* 32.2* 30.0* 31.5* 30.9*  ?MCV 91.4 94.2  --  89.7 90.1  ?PLT 233 200  --  219 205  ? ? ?Basic Metabolic Panel: ?Recent Labs  ?Lab 12/01/21 ?2234 12/02/21 ?0430 12/02/21 ?1031 12/03/21 ?0604 12/04/21 ?0416  ?NA 137 140 139 138 139  ?K 3.9 3.9 3.8 3.5 3.2*  ?CL 102 104  --  102 100  ?CO2 25 26  --  26 29  ?GLUCOSE 206* 160*  --  136* 146*  ?BUN 21 21  --  17 13  ?CREATININE 1.11 1.05  --  0.98 0.85  ?CALCIUM 8.7* 8.5*  --  8.6* 8.5*  ?MG  --  1.7  --   --  1.7  ?PHOS  --  3.8  --   --   --   ? ? ?  GFR: ?Estimated Creatinine Clearance: 52.3 mL/min (by C-G formula based on SCr of 0.85 mg/dL). ? ?Liver Function Tests: ?Recent Labs  ?Lab 12/01/21 ?2234 12/02/21 ?0430  ?AST 19 16  ?ALT 15 14  ?ALKPHOS 50 42  ?BILITOT 0.8 1.0  ?PROT 6.2* 5.7*  ?ALBUMIN 3.1* 2.7*  ? ? ?CBG: ?No results for input(s): GLUCAP in the last 168 hours. ? ? ?Recent Results (from the past 240 hour(s))  ?Resp Panel by RT-PCR (Flu A&B, Covid) Nasopharyngeal Swab     Status: None  ? Collection Time: 12/01/21  7:30 PM  ? Specimen: Nasopharyngeal Swab; Nasopharyngeal(NP) swabs in vial transport medium  ?Result Value Ref Range Status  ? SARS Coronavirus 2 by RT PCR NEGATIVE NEGATIVE Final  ?  Comment: (NOTE) ?SARS-CoV-2 target nucleic acids are NOT DETECTED. ? ?The SARS-CoV-2 RNA is generally detectable in upper respiratory ?specimens during the acute phase of infection. The lowest ?concentration of SARS-CoV-2 viral  copies this assay can detect is ?138 copies/mL. A negative result does not preclude SARS-Cov-2 ?infection and should not be used as the sole basis for treatment or ?other patient management decisions. A negative result may o

## 2021-12-04 NOTE — Assessment & Plan Note (Addendum)
-   Repleted during the hospitalization.   ?-Patient's diuretics were held and will be resumed 2 to 3 days post discharge patient to patient's.  Potassium supplementation will be resumed 2 to 3 days post discharge.   ?
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
## 2021-12-05 LAB — BASIC METABOLIC PANEL
Anion gap: 9 (ref 5–15)
BUN: 9 mg/dL (ref 8–23)
CO2: 27 mmol/L (ref 22–32)
Calcium: 8.4 mg/dL — ABNORMAL LOW (ref 8.9–10.3)
Chloride: 101 mmol/L (ref 98–111)
Creatinine, Ser: 0.84 mg/dL (ref 0.61–1.24)
GFR, Estimated: >60 mL/min (ref >60)
Glucose, Bld: 147 mg/dL — ABNORMAL HIGH (ref 70–99)
Potassium: 3.6 mmol/L (ref 3.5–5.1)
Sodium: 137 mmol/L (ref 135–145)

## 2021-12-05 LAB — MAGNESIUM: Magnesium: 2.3 mg/dL (ref 1.7–2.4)

## 2021-12-05 LAB — CBC
HCT: 29.4 % — ABNORMAL LOW (ref 39.0–52.0)
Hemoglobin: 10 g/dL — ABNORMAL LOW (ref 13.0–17.0)
MCH: 31 pg (ref 26.0–34.0)
MCHC: 34 g/dL (ref 30.0–36.0)
MCV: 91 fL (ref 80.0–100.0)
Platelets: 189 10*3/uL (ref 150–400)
RBC: 3.23 MIL/uL — ABNORMAL LOW (ref 4.22–5.81)
RDW: 14.5 % (ref 11.5–15.5)
WBC: 6 10*3/uL (ref 4.0–10.5)
nRBC: 0 % (ref 0.0–0.2)

## 2021-12-05 MED ORDER — POLYETHYLENE GLYCOL 3350 17 G PO PACK: 17.0000 g | PACK | Freq: Every day | ORAL | Status: DC | PRN

## 2021-12-05 MED ORDER — POTASSIUM CHLORIDE CRYS ER 20 MEQ PO TBCR
20.0000 meq | EXTENDED_RELEASE_TABLET | Freq: Once | ORAL | Status: AC
  Administered 2021-12-05: 20 meq via ORAL
  Filled 2021-12-05: qty 1

## 2021-12-05 MED ORDER — AMOXICILLIN-POT CLAVULANATE 875-125 MG PO TABS
1.0000 | ORAL_TABLET | Freq: Two times a day (BID) | ORAL | Status: DC
  Administered 2021-12-05 – 2021-12-06 (×3): 1 via ORAL
  Filled 2021-12-05 (×3): qty 1

## 2021-12-05 MED ORDER — AZITHROMYCIN 500 MG PO TABS
500.0000 mg | ORAL_TABLET | Freq: Every day | ORAL | Status: AC
  Administered 2021-12-05 – 2021-12-06 (×2): 500 mg via ORAL
  Filled 2021-12-05 (×2): qty 1

## 2021-12-05 NOTE — Plan of Care (Signed)
Pt is alert oriented x 1, pt has condom cath in place for urine out put. Pt took medications with out complications. Pt bed alarm on. Pt encouraged to call out if assistance is needed. Pt denies pain. Pt has congested cough, not productive. Pt continues to get scheduled mucinex and prn robitussin as needed.  ? ? ?Problem: Education: ?Goal: Knowledge of General Education information will improve ?Description: Including pain rating scale, medication(s)/side effects and non-pharmacologic comfort measures ?Outcome: Progressing ?  ?Problem: Health Behavior/Discharge Planning: ?Goal: Ability to manage health-related needs will improve ?Outcome: Progressing ?  ?Problem: Clinical Measurements: ?Goal: Ability to maintain clinical measurements within normal limits will improve ?Outcome: Progressing ?Goal: Will remain free from infection ?Outcome: Progressing ?Goal: Diagnostic test results will improve ?Outcome: Progressing ?Goal: Respiratory complications will improve ?Outcome: Progressing ?Goal: Cardiovascular complication will be avoided ?Outcome: Progressing ?  ?Problem: Activity: ?Goal: Risk for activity intolerance will decrease ?Outcome: Progressing ?  ?Problem: Nutrition: ?Goal: Adequate nutrition will be maintained ?Outcome: Progressing ?  ?Problem: Coping: ?Goal: Level of anxiety will decrease ?Outcome: Progressing ?  ?Problem: Elimination: ?Goal: Will not experience complications related to bowel motility ?Outcome: Progressing ?Goal: Will not experience complications related to urinary retention ?Outcome: Progressing ?  ?Problem: Pain Managment: ?Goal: General experience of comfort will improve ?Outcome: Progressing ?  ?Problem: Safety: ?Goal: Ability to remain free from injury will improve ?Outcome: Progressing ?  ?Problem: Skin Integrity: ?Goal: Risk for impaired skin integrity will decrease ?Outcome: Progressing ?  ?

## 2021-12-05 NOTE — TOC Progression Note (Addendum)
Transition of Care (TOC) - Progression Note  ? ? ?Patient Details  ?Name: [name] [name] ?MRN: [id number] ?Date of Birth: February 16, 1930 ? ?Transition of Care (TOC) CM/SW Contact  ?Pollie Friar, RN ?Phone Number: ?12/05/2021, 10:55 AM ? ?Clinical Narrative:    ?[id number] Avanell Shackleton YC#[id number] 3/31-4/3 ?Have insurance approval for SNF rehab at [facility]. However pt had mits on overnight and the facility needs them off for 24 hours. Bedside RN, MD, caregiver and POA updated.  ?Plan to d/c to [facility] tomorrow.  ? ? ?Expected Discharge Plan: [place] ?Barriers to Discharge: Continued Medical Work up ? ?Expected Discharge Plan and Services ?Expected Discharge Plan: [place] ?  ?Discharge Planning Services: CM Consult ?Post Acute Care Choice: [place] ?Living arrangements for the past 2 months: [place] ?                ?  ?  ?  ?  ?  ?  ?  ?  ?  ?  ? ? ?Social Determinants of Health (SDOH) Interventions ?  ? ?Readmission Risk Interventions ?   ? View : No data to display.  ?  ?  ?  ? ? ?

## 2021-12-05 NOTE — Progress Notes (Signed)
Occupational Therapy Treatment ?Patient Details ?Name: [name] [name] ?MRN: [id number] ?DOB: [birth year] ?Today's Date: 12/05/2021 ? ? ?History of present illness 88 yo male presents to [facility] on 3/27 with coughing, increased secretions, weakness. Workup for AME, possible PNA vs UTI. PMH includes developmental delay, HLD, pacemaker 2016, AAA s/p stenting 2014, AVR 2004, CABG 2004. ?  ?OT comments ? Pt making limited progress with OT goals. This session pt was having difficulties following simple commands, he followed approximately 50% of commands this session. Additionally, pt presented with difficulty sequencing tasks to complete brushing his teeth, washing his face and hands, and taking steps in standing. Continues to need mod A +2 for bed mobility and Max A +2 for sit<>stands. OT will continue to follow acutely and continues to recommend SNF.   ? ?Recommendations for follow up therapy are one component of a multi-disciplinary discharge planning process, led by the attending physician.  Recommendations may be updated based on patient status, additional functional criteria and insurance authorization. ?   ?Follow Up Recommendations ? Skilled nursing-short term rehab (<3 hours/day)  ?  ?Assistance Recommended at Discharge Frequent or constant Supervision/Assistance  ?Patient can return home with the following ? Two people to help with walking and/or transfers;Two people to help with bathing/dressing/bathroom;Assistance with cooking/housework;Direct supervision/assist for medications management;Direct supervision/assist for financial management;Assist for transportation;Help with stairs or ramp for entrance ?  ?Equipment Recommendations ? None recommended by OT  ?  ?Recommendations for Other Services   ? ?  ?Precautions / Restrictions Precautions ?Precautions: Fall ?Restrictions ?Weight Bearing Restrictions: No  ? ? ?  ? ?Mobility Bed Mobility ?Overal bed mobility: Needs Assistance ?Bed Mobility: Supine to Sit, Sit to  Supine ?  ?  ?Supine to sit: Mod assist, +2 for physical assistance ?Sit to supine: Mod assist, +2 for physical assistance, +2 for safety/equipment ?  ?General bed mobility comments: modA+2 for supine<>sit for trunk and LE management. Patient initiating but resistive at same time ?  ? ?Transfers ?Overall transfer level: Needs assistance ?Equipment used: 2 person hand held assist, Rolling walker (2 wheels) ?Transfers: Sit to/from Stand ?Sit to Stand: Max assist, +2 physical assistance ?  ?  ?  ?  ?  ?General transfer comment: maxA+2 to stand from EOB with cues for hand placement but poor follow through. performed sit to stand x 5. 2 times with HHA and 3 times with RW. ?  ?  ?Balance Overall balance assessment: Needs assistance, History of Falls ?Sitting-balance support: Bilateral upper extremity supported, Feet supported ?Sitting balance-Leahy Scale: Fair ?  ?  ?Standing balance support: Bilateral upper extremity supported, During functional activity ?Standing balance-Leahy Scale: Zero ?Standing balance comment: maxA+2 ?  ?  ?  ?  ?  ?  ?  ?  ?  ?  ?  ?   ? ?ADL either performed or assessed with clinical judgement  ? ?ADL Overall ADL's : Needs assistance/impaired ?  ?  ?Grooming: Moderate assistance;Sitting;Oral care;Wash/dry face;Wash/dry hands ?Grooming Details (indicate cue type and reason): Pt having difficulty sequencing steps to brush his teeth. He would dip his tooth brush in multiple cups on the tray, and held the tooth paste, unsure what to do with it. ?  ?  ?  ?  ?  ?  ?  ?  ?Toilet Transfer: Maximal assistance;+2 for safety/equipment;Stand-pivot ?Toilet Transfer Details (indicate cue type and reason): Very limited pivot due to difficulty processing moving his feet in standing ?  ?  ?  ?  ?  ?General  ADL Comments: Pt having difficulty sequencing/processing tasks this session ?  ? ?Extremity/Trunk Assessment   ?  ?  ?  ?  ?  ? ?Vision   ?  ?  ?Perception   ?  ?Praxis   ?  ? ?Cognition Arousal/Alertness:  Awake/alert ?Behavior During Therapy: [facility] for tasks assessed/performed, Anxious ?Overall Cognitive Status: History of cognitive impairments - at baseline ?Area of Impairment: Orientation ?  ?  ?  ?  ?  ?  ?  ?  ?Orientation Level: Disoriented to, Place, Time, Situation ?  ?  ?  ?  ?  ?  ?General Comments: at times, able to identify place but inconsistently during session. Baseline developmental delay but unsure of baseline cognition. This session pt having difficulty processing brushing his teeth sitting EOB or taking steps forward in standing ?  ?  ?   ?Exercises   ? ?  ?Shoulder Instructions   ? ? ?  ?General Comments VSS on RA  ? ? ?Pertinent Vitals/ Pain       Pain Assessment ?Pain Assessment: No/denies pain ? ?Home Living   ?  ?  ?  ?  ?  ?  ?  ?  ?  ?  ?  ?  ?  ?  ?  ?  ?  ?  ? ?  ?Prior Functioning/Environment    ?  ?  ?  ?   ? ?Frequency ? Min 2X/week  ? ? ? ? ?  ?Progress Toward Goals ? ?OT Goals(current goals can now be found in the care plan section) ? Progress towards OT goals: Progressing toward goals ? ?Acute Rehab OT Goals ?Patient Stated Goal: To go home ?OT Goal Formulation: With patient ?Time For Goal Achievement: 12/17/21 ?Potential to Achieve Goals: Fair ?ADL Goals ?Pt Will Perform Grooming: with supervision;standing ?Pt Will Perform Lower Body Bathing: with supervision;sitting/lateral leans;sit to/from stand ?Pt Will Perform Lower Body Dressing: with supervision;sitting/lateral leans;sit to/from stand ?Pt Will Transfer to Toilet: with supervision;stand pivot transfer ?Pt Will Perform Toileting - Clothing Manipulation and hygiene: with supervision;sitting/lateral leans;sit to/from stand  ?Plan Discharge plan remains appropriate;Frequency remains appropriate   ? ?Co-evaluation ? ? ? PT/OT/SLP Co-Evaluation/Treatment: Yes ?Reason for Co-Treatment: For patient/therapist safety;To address functional/ADL transfers ?PT goals addressed during session: Balance;Mobility/safety with mobility ?OT goals  addressed during session: ADL's and self-care;Strengthening/ROM ?  ? ?  ?AM-PAC OT "6 Clicks" Daily Activity     ?Outcome Measure ? ? Help from another person eating meals?: A Little ?Help from another person taking care of personal grooming?: A Lot ?Help from another person toileting, which includes using toliet, bedpan, or urinal?: A Lot ?Help from another person bathing (including washing, rinsing, drying)?: A Lot ?Help from another person to put on and taking off regular upper body clothing?: A Little ?Help from another person to put on and taking off regular lower body clothing?: A Lot ?6 Click Score: 14 ? ?  ?End of Session Equipment Utilized During Treatment: Gait belt;Rolling walker (2 wheels) ? ?OT Visit Diagnosis: Unsteadiness on feet (R26.81);Other abnormalities of gait and mobility (R26.89);Muscle weakness (generalized) (M62.81) ?  ?Activity Tolerance Patient tolerated treatment well ?  ?Patient Left in bed;with call bell/phone within reach;with bed alarm set ?  ?Nurse Communication Mobility status ?  ? ?   ? ?Time: 7829-5621 ?OT Time Calculation (min): 26 min ? ?Charges: OT General Charges ?$OT Visit: 1 Visit ?OT Treatments ?$Self Care/Home Management : 8-22 mins ? ?Maymunah Stegemann H., OTR/L ?Acute Rehabilitation ? ?  Lateesha Bezold Elane Yolanda Bonine ?12/05/2021, 5:32 PM ?

## 2021-12-05 NOTE — Progress Notes (Signed)
?PROGRESS NOTE ? ? ? ?[name] [name]  GYJ:[id number] DOB: May 11, 1930 DOA: 12/01/2021 ?PCP: Lajean Manes, MD  ? ? ?Chief Complaint  ?Patient presents with  ? Increased mucus/cough  ? ? ?Brief Narrative:  ?For full narrative please see HPI done earlier this morning by Dr. Nadara Mustard or ?  ?Briefly [name] [name] is a pleasant 88 year old gentleman who presents with 3 to 4 days of confusion on baseline developmental delay concern for community-acquired pneumonia at intake. ?   ? ? ?Assessment & Plan: ? Principal Problem: ?  Acute metabolic encephalopathy ?Active Problems: ?  CAP (community acquired pneumonia) ?  Hypercholesterolemia ?  Paroxysmal atrial fibrillation (HCC) ?  Leukocytosis ?  Urinary tract infection without hematuria ?  UTI (urinary tract infection) ?  Hypokalemia ? ? ? ?Assessment and Plan: ?* Acute metabolic encephalopathy ?  ?#) Acute metabolic encephalopathy:  ?-Reported 3 to 4 days of progressive confusion relative to baseline mental status, with Patient's acute encephalopathy appearing to be on the basis of physiologic stressors stemming from presenting clinically suspected community-acquired pna.  Concern also for UTI.. ?- No obvious additional contributory underlying infectious process at this time, including negative COVID-19/influenza PCR performed today.  ?  ?No additional overt metabolic/electrolyte contributions at this time.  No obvious contributory pharmacologic factors. No overt acute focal neurologic deficits to suggest a contribution from an underlying acute CVA. Seizures are also felt to be less likely.  ?-Patient improving clinically. ?-TSH within normal limits at 1.132. ?-Ammonia levels within normal limits. ?-VBG with a pH of 7.450, PCO2 of 44, PO2 of 44, bicarb of 30.4 ?-Urine cultures pending.   ?-Blood cultures with no growth to date.  ?-Fall precautions. ?-Transition from IV Rocephin and IV azithromycin to oral Augmentin to complete a 7 to 10-day course of antibiotic treatment.    ?-Supportive care.   ?  ?  ?  ? ?Hypokalemia ?- Patient with a potassium of 3.6 this morning. ?-Magnesium noted at 2.3 ?-K-Dur 20 milliequivalents p.o. x1. ?-Repeat labs in the morning. ? ?UTI (urinary tract infection) ?- Urine cultures pending.   ?-Was on IV Rocephin transitioning to oral Augmentin. ? ?Leukocytosis ?- Likely secondary to probable clinical pneumonia and UTI. ?-Blood cultures pending. ?-Urine cultures pending. ?-Leukocytosis has trended down. ?-Was on IV Rocephin and IV azithromycin which will transition to oral Augmentin to complete a course of antibiotic treatment.  ? ?Paroxysmal atrial fibrillation ([place]) ?#) Paroxysmal atrial fibrillation: Documented history of such. In setting of CHA2DS2-VASc score of  3, there is an indication for chronic anticoagulation for thromboembolic prophylaxis.  ?-Patient noted to still be on Eliquis per cardiology note from  10/23/2021.  ?-Home AV nodal blocking regimen: Coreg.  Most recent echocardiogram occurred in December 2016, was notable for LVEF 60 to 65%, mild LVH, normal left ventricular cavity size, and no evidence of focal wall motion abnormalities.  ?-Continue home regimen Coreg. ?-Continue Eliquis for anticoagulation. ?-Per RN 2 days ago, patient with black stools however patient noted to be on oral iron supplementation prior to admission. ?-Patient does endorse black stools at home which he attributes to his oral iron supplementation. ?-Hemoglobin stable. ?-FOBT ordered and pending. ?-Monitor closely on Eliquis. ?  ? ?Hypercholesterolemia ? ? -Continue home regimen statin. ?  ?  ?  ? ?CAP (community acquired pneumonia) ?#) Community-acquired pneumonia, clinical suspicion for such in the setting of 3 to 4 days of new onset cough coinciding with development of confusion, suspected to represent acute metabolic encephalopathy, with presenting labs reflecting leukocytosis  of neutrophilic predominance and presenting chest x-ray showing evidence of bibasilar  airspace opacities, potentially representing infiltrate versus atelectasis.  ?- Procalcitonin 0.17.   ?-COVID-19/influenza PCR negative.  ?-Blood cultures pending with no growth to date. ?-Currently on IV Rocephin and IV azithromycin and transition to oral Augmentin to complete a 7 to 10-day course of antibiotic treatment.   ?-Continue Claritin, Mucinex.   ? ?  ?  ?  ? ?  ?  ?  ? ? ? ? ?  ? ? ?DVT prophylaxis: Eliquis ?Code Status: DNR ?Family Communication: Updated patient.  ?Disposition: SNF hopefully tomorrow. ? ?Status is: Inpatient ?Remains inpatient appropriate because: Severity of illness ?  ?Consultants:  ?None ? ?Procedures:  ?Chest x-ray 12/01/2021 ? ? ?Antimicrobials:  ?IV Rocephin 12/02/2021>>>> 12/05/2021 ?IV azithromycin 12/01/2021>>> 12/05/2021 ?Augmentin 12/05/2021 ? ? ?Subjective: ?Laying in bed.  No chest pain.  No shortness of breath.  No abdominal pain.  Noted to have had a large bowel movement last night.  Tolerating current diet.  ? ?Objective: ?Vitals:  ? 12/04/21 2015 12/05/21 0103 12/05/21 6269 12/05/21 4854  ?BP: (!) 160/105 121/66 (!) 144/79 (!) 156/74  ?Pulse: 63 60 60 60  ?Resp: '20 14 20 18  '$ ?Temp: 99.2 ?F (37.3 ?C) 98.6 ?F (37 ?C) 97.7 ?F (36.5 ?C) 97.9 ?F (36.6 ?C)  ?TempSrc: Oral Axillary Oral Oral  ?SpO2: 97% 98% 98% 98%  ?Weight:      ? ? ?Intake/Output Summary (Last 24 hours) at 12/05/2021 1111 ?Last data filed at 12/05/2021 1000 ?Gross per 24 hour  ?Intake 880 ml  ?Output 275 ml  ?Net 605 ml  ? ?Filed Weights  ? 12/04/21 0356  ?Weight: 62.8 kg  ? ? ?Examination: ? ?General exam: NAD ?Respiratory system: CTA B.  No wheezes, no crackles, no rhonchi.  Fair air movement.  Speaking in full sentences.  No use of accessory muscles of respiration.   ?Cardiovascular system: RRR no murmurs rubs or gallops.  No JVD.  No lower extremity edema. ?Gastrointestinal system: Abdomen is soft, nontender, nondistended, positive bowel sounds.  No rebound.  No guarding. ?Central nervous system: Alert and  oriented. No focal neurological deficits.  Moving extremities spontaneously. ?Extremities: Symmetric 5 x 5 power. ?Skin: No rashes, lesions or ulcers ?Psychiatry: Judgement and insight appear fair. Mood & affect appropriate.  ? ? ? ?Data Reviewed:  ? ?CBC: ?Recent Labs  ?Lab 12/01/21 ?2234 12/02/21 ?0430 12/02/21 ?1031 12/03/21 ?0604 12/04/21 ?0416 12/05/21 ?0244  ?WBC 15.4* 9.7  --  8.1 6.2 6.0  ?NEUTROABS 14.0* 8.0*  --  6.5 4.5  --   ?HGB 11.6* 10.5* 10.2* 10.7* 10.5* 10.0*  ?HCT 34.0* 32.2* 30.0* 31.5* 30.9* 29.4*  ?MCV 91.4 94.2  --  89.7 90.1 91.0  ?PLT 233 200  --  219 205 189  ? ? ?Basic Metabolic Panel: ?Recent Labs  ?Lab 12/01/21 ?2234 12/02/21 ?0430 12/02/21 ?1031 12/03/21 ?0604 12/04/21 ?0416 12/05/21 ?0244  ?NA 137 140 139 138 139 137  ?K 3.9 3.9 3.8 3.5 3.2* 3.6  ?CL 102 104  --  102 100 101  ?CO2 25 26  --  '26 29 27  '$ ?GLUCOSE 206* 160*  --  136* 146* 147*  ?BUN 21 21  --  '17 13 9  '$ ?CREATININE 1.11 1.05  --  0.98 0.85 0.84  ?CALCIUM 8.7* 8.5*  --  8.6* 8.5* 8.4*  ?MG  --  1.7  --   --  1.7 2.3  ?PHOS  --  3.8  --   --   --   --   ? ? ?  GFR: ?Estimated Creatinine Clearance: 53 mL/min (by C-G formula based on SCr of 0.84 mg/dL). ? ?Liver Function Tests: ?Recent Labs  ?Lab 12/01/21 ?2234 12/02/21 ?0430  ?AST 19 16  ?ALT 15 14  ?ALKPHOS 50 42  ?BILITOT 0.8 1.0  ?PROT 6.2* 5.7*  ?ALBUMIN 3.1* 2.7*  ? ? ?CBG: ?No results for input(s): GLUCAP in the last 168 hours. ? ? ?Recent Results (from the past 240 hour(s))  ?Resp Panel by RT-PCR (Flu A&B, Covid) Nasopharyngeal Swab     Status: None  ? Collection Time: 12/01/21  7:30 PM  ? Specimen: Nasopharyngeal Swab; Nasopharyngeal(NP) swabs in vial transport medium  ?Result Value Ref Range Status  ? SARS Coronavirus 2 by RT PCR NEGATIVE NEGATIVE Final  ?  Comment: (NOTE) ?SARS-CoV-2 target nucleic acids are NOT DETECTED. ? ?The SARS-CoV-2 RNA is generally detectable in upper respiratory ?specimens during the acute phase of infection. The lowest ?concentration of  SARS-CoV-2 viral copies this assay can detect is ?138 copies/mL. A negative result does not preclude SARS-Cov-2 ?infection and should not be used as the sole basis for treatment or ?other patient management decisions. A negat

## 2021-12-05 NOTE — Plan of Care (Signed)
Pt is oriented x 1, pt was restless at beginning of shift. Pt attempting to get out of bed. Pt had two bowel movents, one small and one very large. Pt cleansed and repositioned. No distress noted. Pt has mitts on due to pulling at telemetry and condom cath. Bed alarm on.  ? ? ?Problem: Education: ?Goal: Knowledge of General Education information will improve ?Description: Including pain rating scale, medication(s)/side effects and non-pharmacologic comfort measures ?Outcome: Progressing ?  ?Problem: Health Behavior/Discharge Planning: ?Goal: Ability to manage health-related needs will improve ?Outcome: Progressing ?  ?Problem: Clinical Measurements: ?Goal: Ability to maintain clinical measurements within normal limits will improve ?Outcome: Progressing ?Goal: Will remain free from infection ?Outcome: Progressing ?Goal: Diagnostic test results will improve ?Outcome: Progressing ?Goal: Respiratory complications will improve ?Outcome: Progressing ?Goal: Cardiovascular complication will be avoided ?Outcome: Progressing ?  ?Problem: Activity: ?Goal: Risk for activity intolerance will decrease ?Outcome: Progressing ?  ?Problem: Nutrition: ?Goal: Adequate nutrition will be maintained ?Outcome: Progressing ?  ?Problem: Coping: ?Goal: Level of anxiety will decrease ?Outcome: Progressing ?  ?Problem: Elimination: ?Goal: Will not experience complications related to bowel motility ?Outcome: Progressing ?Goal: Will not experience complications related to urinary retention ?Outcome: Progressing ?  ?Problem: Pain Managment: ?Goal: General experience of comfort will improve ?Outcome: Progressing ?  ?Problem: Safety: ?Goal: Ability to remain free from injury will improve ?Outcome: Progressing ?  ?Problem: Skin Integrity: ?Goal: Risk for impaired skin integrity will decrease ?Outcome: Progressing ?  ?

## 2021-12-05 NOTE — Progress Notes (Signed)
Physical Therapy Treatment ?Patient Details ?Name: [name] [name] ?MRN: [id number] ?DOB: 1930-02-17 ?Today's Date: 12/05/2021 ? ? ?History of Present Illness 88 yo male [facility] on [facility] with coughing, increased secretions, weakness. Workup for AME, possible PNA vs UTI. PMH includes developmental delay, HLD, pacemaker 2016, AAA s/p stenting 2014, AVR 2004, CABG 2004. ? ?  ?PT Comments  ? ? Continues to be limited by confusion and weakness. Patient required modA+2 for bed mobility and maxA+2 for repeated sit to stands at EOB. Able to do small marches but demos L knee buckling during stance. Sitting balance improved from previous session and able to perform ADL while sitting EOB. Continue to recommend SNF for ongoing Physical Therapy.    ?   ?Recommendations for follow up therapy are one component of a multi-disciplinary discharge planning process, led by the attending physician.  Recommendations may be updated based on patient status, additional functional criteria and insurance authorization. ? ?Follow Up Recommendations ? Skilled nursing-short term rehab (<3 hours/day) ?  ?  ?Assistance Recommended at Discharge Frequent or constant Supervision/Assistance  ?Patient can return home with the following A lot of help with walking and/or transfers;A lot of help with bathing/dressing/bathroom;Assistance with feeding;Direct supervision/assist for medications management;Assist for transportation;Help with stairs or ramp for entrance;Direct supervision/assist for financial management;Assistance with cooking/housework ?  ?Equipment Recommendations ? None recommended by PT  ?  ?Recommendations for Other Services   ? ? ?  ?Precautions / Restrictions Precautions ?Precautions: Fall ?Restrictions ?Weight Bearing Restrictions: No  ?  ? ?Mobility ? Bed Mobility ?Overal bed mobility: Needs Assistance ?Bed Mobility: Supine to Sit, Sit to Supine ?  ?  ?Supine to sit: Mod assist, +2 for physical assistance ?Sit to supine: Mod assist,  +2 for physical assistance, +2 for safety/equipment ?  ?General bed mobility comments: modA+2 for supine<>sit for trunk and LE management. Patient initiating but resistive at same time ?  ? ?Transfers ?Overall transfer level: Needs assistance ?Equipment used: 2 person hand held assist, Rolling Demontez Novack (2 wheels) ?Transfers: Sit to/from Stand ?Sit to Stand: Max assist, +2 physical assistance ?  ?  ?  ?  ?  ?General transfer comment: maxA+2 to stand from EOB with cues for hand placement but poor follow through. performed sit to stand x 5. 2 times with HHA and 3 times with RW. ?  ? ?Ambulation/Gait ?  ?  ?  ?  ?  ?  ?Pre-gait activities: with max cueing patient able to do small marches but L knee buckling during stance ?  ? ? ?Stairs ?  ?  ?  ?  ?  ? ? ?Wheelchair Mobility ?  ? ?Modified Rankin (Stroke Patients Only) ?  ? ? ?  ?Balance Overall balance assessment: Needs assistance, History of Falls ?Sitting-balance support: Bilateral upper extremity supported, Feet supported ?Sitting balance-Leahy Scale: Fair ?  ?  ?Standing balance support: Bilateral upper extremity supported, During functional activity ?Standing balance-Leahy Scale: Zero ?Standing balance comment: maxA+2 ?  ?  ?  ?  ?  ?  ?  ?  ?  ?  ?  ?  ? ?  ?Cognition Arousal/Alertness: Awake/alert ?Behavior During Therapy: [facility] for tasks assessed/performed, Anxious ?Overall Cognitive Status: History of cognitive impairments - at baseline ?Area of Impairment: Orientation ?  ?  ?  ?  ?  ?  ?  ?  ?Orientation Level: Disoriented to, Place, Time, Situation ?  ?  ?  ?  ?  ?  ?General Comments: at times,  able to identify place but inconsistently during session. Baseline developmental delay but unsure of baseline cognition due to lack of family/friends ?  ?  ? ?  ?Exercises   ? ?  ?General Comments   ?  ?  ? ?Pertinent Vitals/Pain Pain Assessment ?Pain Assessment: No/denies pain  ? ? ?Home Living   ?  ?  ?  ?  ?  ?  ?  ?  ?  ?   ?  ?Prior Function    ?  ?  ?   ? ?PT Goals  (current goals can now be found in the care plan section) Acute Rehab PT Goals ?PT Goal Formulation: Patient unable to participate in goal setting ?Time For Goal Achievement: 12/17/21 ?Potential to Achieve Goals: Fair ?Progress towards PT goals: Progressing toward goals ? ?  ?Frequency ? ? ? Min 2X/week ? ? ? ?  ?PT Plan Current plan remains appropriate  ? ? ?Co-evaluation PT/OT/SLP Co-Evaluation/Treatment: Yes ?Reason for Co-Treatment: For patient/therapist safety;To address functional/ADL transfers ?PT goals addressed during session: Balance;Mobility/safety with mobility ?  ?  ? ?  ?AM-PAC PT "6 Clicks" Mobility   ?Outcome Measure ? Help needed turning from your back to your side while in a flat bed without using bedrails?: Total ?Help needed moving from lying on your back to sitting on the side of a flat bed without using bedrails?: Total ?Help needed moving to and from a bed to a chair (including a wheelchair)?: Total ?Help needed standing up from a chair using your arms (e.g., wheelchair or bedside chair)?: Total ?Help needed to walk in hospital room?: Total ?Help needed climbing 3-5 steps with a railing? : Total ?6 Click Score: 6 ? ?  ?End of Session Equipment Utilized During Treatment: Gait belt ?Activity Tolerance: Patient tolerated treatment well ?Patient left: in bed;with call bell/phone within reach;with bed alarm set ?Nurse Communication: Mobility status ?PT Visit Diagnosis: Other abnormalities of gait and mobility (R26.89);Difficulty in walking, not elsewhere classified (R26.2) ?  ? ? ?Time: 1610-9604 ?PT Time Calculation (min) (ACUTE ONLY): 25 min ? ?Charges:  $Therapeutic Activity: 8-22 mins          ?          ? ?Wilferd Ritson A. Gilford Rile, PT, DPT ?Acute Rehabilitation Services ?Pager [phone] ?Office [phone] ? ? ? ?Lenard Kampf A Azlynn Mitnick ?12/05/2021, 5:16 PM ? ?

## 2021-12-06 DIAGNOSIS — Z7901 Long term (current) use of anticoagulants: Secondary | ICD-10-CM | POA: Diagnosis not present

## 2021-12-06 DIAGNOSIS — R410 Disorientation, unspecified: Secondary | ICD-10-CM | POA: Diagnosis not present

## 2021-12-06 DIAGNOSIS — M1990 Unspecified osteoarthritis, unspecified site: Secondary | ICD-10-CM | POA: Diagnosis not present

## 2021-12-06 DIAGNOSIS — E7800 Pure hypercholesterolemia, unspecified: Secondary | ICD-10-CM | POA: Diagnosis not present

## 2021-12-06 DIAGNOSIS — Z7401 Bed confinement status: Secondary | ICD-10-CM | POA: Diagnosis not present

## 2021-12-06 DIAGNOSIS — G9341 Metabolic encephalopathy: Secondary | ICD-10-CM | POA: Diagnosis not present

## 2021-12-06 DIAGNOSIS — H409 Unspecified glaucoma: Secondary | ICD-10-CM | POA: Diagnosis not present

## 2021-12-06 DIAGNOSIS — I2510 Atherosclerotic heart disease of native coronary artery without angina pectoris: Secondary | ICD-10-CM | POA: Diagnosis not present

## 2021-12-06 DIAGNOSIS — N3000 Acute cystitis without hematuria: Secondary | ICD-10-CM | POA: Diagnosis not present

## 2021-12-06 DIAGNOSIS — R627 Adult failure to thrive: Secondary | ICD-10-CM

## 2021-12-06 DIAGNOSIS — I714 Abdominal aortic aneurysm, without rupture, unspecified: Secondary | ICD-10-CM | POA: Diagnosis not present

## 2021-12-06 DIAGNOSIS — E876 Hypokalemia: Secondary | ICD-10-CM

## 2021-12-06 DIAGNOSIS — H9190 Unspecified hearing loss, unspecified ear: Secondary | ICD-10-CM | POA: Diagnosis not present

## 2021-12-06 DIAGNOSIS — I480 Paroxysmal atrial fibrillation: Secondary | ICD-10-CM | POA: Diagnosis not present

## 2021-12-06 DIAGNOSIS — E46 Unspecified protein-calorie malnutrition: Secondary | ICD-10-CM | POA: Diagnosis not present

## 2021-12-06 DIAGNOSIS — D72829 Elevated white blood cell count, unspecified: Secondary | ICD-10-CM | POA: Diagnosis not present

## 2021-12-06 DIAGNOSIS — I442 Atrioventricular block, complete: Secondary | ICD-10-CM | POA: Diagnosis not present

## 2021-12-06 DIAGNOSIS — Z743 Need for continuous supervision: Secondary | ICD-10-CM | POA: Diagnosis not present

## 2021-12-06 DIAGNOSIS — E785 Hyperlipidemia, unspecified: Secondary | ICD-10-CM | POA: Diagnosis not present

## 2021-12-06 DIAGNOSIS — N390 Urinary tract infection, site not specified: Secondary | ICD-10-CM | POA: Diagnosis not present

## 2021-12-06 DIAGNOSIS — R2681 Unsteadiness on feet: Secondary | ICD-10-CM | POA: Diagnosis not present

## 2021-12-06 DIAGNOSIS — J189 Pneumonia, unspecified organism: Secondary | ICD-10-CM | POA: Diagnosis not present

## 2021-12-06 DIAGNOSIS — Z950 Presence of cardiac pacemaker: Secondary | ICD-10-CM | POA: Diagnosis not present

## 2021-12-06 DIAGNOSIS — Z952 Presence of prosthetic heart valve: Secondary | ICD-10-CM | POA: Diagnosis not present

## 2021-12-06 DIAGNOSIS — Z79899 Other long term (current) drug therapy: Secondary | ICD-10-CM | POA: Diagnosis not present

## 2021-12-06 DIAGNOSIS — R6889 Other general symptoms and signs: Secondary | ICD-10-CM | POA: Diagnosis not present

## 2021-12-06 LAB — BASIC METABOLIC PANEL
Anion gap: 7 (ref 5–15)
BUN: 7 mg/dL — ABNORMAL LOW (ref 8–23)
CO2: 27 mmol/L (ref 22–32)
Calcium: 8.9 mg/dL (ref 8.9–10.3)
Chloride: 103 mmol/L (ref 98–111)
Creatinine, Ser: 0.87 mg/dL (ref 0.61–1.24)
GFR, Estimated: >60 mL/min (ref >60)
Glucose, Bld: 145 mg/dL — ABNORMAL HIGH (ref 70–99)
Potassium: 4.2 mmol/L (ref 3.5–5.1)
Sodium: 137 mmol/L (ref 135–145)

## 2021-12-06 LAB — CBC
HCT: 34.3 % — ABNORMAL LOW (ref 39.0–52.0)
Hemoglobin: 11.6 g/dL — ABNORMAL LOW (ref 13.0–17.0)
MCH: 30.7 pg (ref 26.0–34.0)
MCHC: 33.8 g/dL (ref 30.0–36.0)
MCV: 90.7 fL (ref 80.0–100.0)
Platelets: 228 10*3/uL (ref 150–400)
RBC: 3.78 MIL/uL — ABNORMAL LOW (ref 4.22–5.81)
RDW: 14.5 % (ref 11.5–15.5)
WBC: 7.2 10*3/uL (ref 4.0–10.5)
nRBC: 0 % (ref 0.0–0.2)

## 2021-12-06 LAB — CULTURE, BLOOD (ROUTINE X 2): Culture: NO GROWTH

## 2021-12-06 LAB — METHYLMALONIC ACID, SERUM: Methylmalonic Acid, Quantitative: 169 nmol/L (ref 0–378)

## 2021-12-06 MED ORDER — FUROSEMIDE 40 MG PO TABS
40.0000 mg | ORAL_TABLET | Freq: Every day | ORAL | 1 refills | Status: DC
Start: 2021-12-08 — End: 2023-02-11

## 2021-12-06 MED ORDER — SENNOSIDES-DOCUSATE SODIUM 8.6-50 MG PO TABS: 1.0000 | ORAL_TABLET | Freq: Two times a day (BID) | ORAL | Status: DC

## 2021-12-06 MED ORDER — MORPHINE SULFATE (CONCENTRATE) 10 MG /0.5 ML PO SOLN: 0.5000 mg | ORAL | 0 refills | Status: DC | PRN

## 2021-12-06 MED ORDER — GUAIFENESIN ER 600 MG PO TB12: 1200.0000 mg | ORAL_TABLET | Freq: Two times a day (BID) | ORAL | 0 refills | Status: AC

## 2021-12-06 MED ORDER — ATIVAN 0.5 MG PO TABS: 0.5000 mg | ORAL_TABLET | ORAL | 0 refills | Status: DC | PRN

## 2021-12-06 MED ORDER — POTASSIUM CHLORIDE CRYS ER 20 MEQ PO TBCR
20.0000 meq | EXTENDED_RELEASE_TABLET | Freq: Every day | ORAL | Status: DC
Start: 2021-12-08 — End: 2023-02-11

## 2021-12-06 MED ORDER — AMOXICILLIN-POT CLAVULANATE 875-125 MG PO TABS: 1.0000 | ORAL_TABLET | Freq: Two times a day (BID) | ORAL | 0 refills | Status: AC

## 2021-12-06 NOTE — TOC Transition Note (Signed)
Transition of Care (TOC) - CM/SW Discharge Note ? ? ?Patient Details  ?Name: [name] [name] ?MRN: [id number] ?Date of Birth: 04-Oct-1929 ? ?Transition of Care (TOC) CM/SW Contact:  ?Amador Cunas, LCSW ?Phone Number: ?12/06/2021, 12:11 PM ? ? ?Clinical Narrative:   Pt for dc to [facility] today. Spoke to Cornish with [facility] who confirmed they are prepared to admit pt today as pt has been without restraints for 24 hours. Pt's POA Belva Chimes [phone] aware of dc and reports agreeable. PTAR arranged for transport and RN provided with number for report. SW signing off at dc.  ? ?Wandra Feinstein, MSW, LCSW ?[phone] (coverage) ? ? ? ? ? ?Final next level of care: [place] ?Barriers to Discharge: No Barriers Identified ? ? ?Patient Goals and CMS Choice ?  ?CMS Medicare.gov Compare Post Acute Care list provided to:: Patient Represenative (must comment) ?Choice offered to / list presented to : [facility] POA / Guardian ? ?Discharge Placement ?  ?           ?Patient chooses bed at: [place], [place] ?Patient to be transferred to facility by: PTAR ?Name of family member notified: Lessie Dings [phone] ?Patient and family notified of of transfer: 12/06/21 ? ?Discharge Plan and Services ?  ?Discharge Planning Services: CM Consult ?Post Acute Care Choice: [place]          ?  ?  ?  ?  ?  ?  ?  ?  ?  ?  ? ?Social Determinants of Health (SDOH) Interventions ?  ? ? ?Readmission Risk Interventions ?   ? View : No data to display.  ?  ?  ?  ? ? ? ? ? ?

## 2021-12-06 NOTE — Progress Notes (Incomplete)
Report called to April @ [facility] in [place]. Also Joaquim Lai  ?

## 2021-12-06 NOTE — Discharge Summary (Signed)
Physician Discharge Summary  ?[name] [name] KXF:[id number] DOB: November 25, 1929 DOA: 12/01/2021 ? ?PCP: Lajean Manes, MD ? ?Admit date: 12/01/2021 ?Discharge date: 12/06/2021 ? ?Time spent: 60 minutes ? ?Recommendations for Outpatient Follow-up:  ?Patient was discharged to skilled nursing facility.  Follow-up with MD at skilled nursing facility.  Patient will need a basic metabolic profile, magnesium level, CBC done in 1 week. ?Follow-up with [place], Hal, MD 2 to 3 weeks post discharge.  On follow-up patient will need a basic metabolic profile done to follow-up on electrolytes and renal function ? ? ? ?Discharge Diagnoses:  ?Principal Problem: ?  Acute metabolic encephalopathy ?Active Problems: ?  CAP (community acquired pneumonia) ?  Hypercholesterolemia ?  Paroxysmal atrial fibrillation (HCC) ?  Leukocytosis ?  Urinary tract infection without hematuria ?  UTI (urinary tract infection) ?  Hypokalemia ?  Failure to thrive in adult ? ? ?Discharge Condition: Stable and improved ? ?Diet recommendation: Regular ? ?Filed Weights  ? 12/04/21 0356 12/06/21 0500  ?Weight: 62.8 kg 63.1 kg  ? ? ?History of present illness:  ?HPI per Dr.Howerter ? [name] [name] is a 88 y.o. male with medical history significant for developmental delay, hyperlipidemia, complete heart block status post pacemaker placement in 2016, who is admitted to [facility] on 12/01/2021 with suspected acute metabolic encephalopathy after presenting from home to [facility] ED for evaluation of altered mental status. ?  ?In the setting of altered mental status superimposed on developmental delay from birth, the following history is provided by the patient's caregiver as well as my discussions with the EDP and via chart review. ?  ?The patient reportedly lives independently, but has a caregiver who checks on him on a daily basis in the context of a history of developmental delay from birth. ?  ?Per caregiver, the patient has exhibited evidence of 3 to 4 days of  progressive confusion relative to baseline mental status, coinciding with new onset nonproductive cough.  No reported recent trauma.  No known recent vomiting, diarrhea, rash.  ?  ?  ?  ?  ?ED Course:  ?Vital signs in the ED were notable for the following: Afebrile; heart rate 60-75, blood pressure 117/77; respiratory rate 15-20, oxygen saturation 90 to 91% on room air. ?  ?Labs were notable for the following: CMP notable for the following: Sodium 137, bicarbonate 25, creatinine 1.11, glucose 206, calcium, adjusted for mild hypoalbuminemia noted to be 9.5, albumin 3.1, other liver enzymes within normal limits.  CBC notable for white blood cell count 15,400 with 91% neutrophils.  Lactic acid 1.5.  Urinalysis ordered, with result currently pending.  COVID-19/influenza PCR negative.  Blood cultures x2 were collected prior to initiation of IV antibiotics. ?  ?Imaging and additional notable ED work-up: Chest x-ray showed bibasilar airspace opacities, felt to represent atelectasis versus infiltrate, will demonstrating no evidence of edema, effusion, or pneumothorax. ?  ?While in the ED, the following were administered: Azithromycin, Rocephin. ?  ?Subsequently, the patient was admitted for further evaluation and management of suspected acute metabolic encephalopathy in the setting of clinically suspected community-acquired pneumonia.  ?Hospital Course:  ? ?Assessment and Plan: ?* Acute metabolic encephalopathy ?  ?#) Acute metabolic encephalopathy:  ?-Reported 3 to 4 days of progressive confusion relative to baseline mental status, with Patient's acute encephalopathy appearing to be on the basis of physiologic stressors stemming from presenting clinically suspected community-acquired pna.  Concern also for UTI.. ?- No obvious additional contributory underlying infectious process at this time, including negative COVID-19/influenza PCR  performed today.  ?  ?No additional overt metabolic/electrolyte contributions at this  time.  No obvious contributory pharmacologic factors. No overt acute focal neurologic deficits to suggest a contribution from an underlying acute CVA. Seizures are also felt to be less likely.  ?-Patient placed on empiric IV antibiotics, gentle hydration and improved clinically during the hospitalization. ?-TSH within normal limits at 1.132. ?-Ammonia levels within normal limits. ?-VBG with a pH of 7.450, PCO2 of 44, PO2 of 44, bicarb of 30.4 ?-Urine cultures pending.   ?-Blood cultures with no growth to date.  ?-Fall precautions. ?-Patient initially on IV Rocephin and IV azithromycin and subsequently transition to Augmentin to complete a 7-day course of antibiotic treatment.   ?-Patient will be discharged on 3 more days of Augmentin.  ?-Patient's mental status had improved and was likely close to baseline by day of discharge. ?-Outpatient follow-up with PCP/MD at SNF.   ?  ?  ?  ? ?Hypokalemia ?- Repleted during the hospitalization.   ?-Patient's diuretics were held and will be resumed 2 to 3 days post discharge patient to patient's.  Potassium supplementation will be resumed 2 to 3 days post discharge.   ? ?UTI (urinary tract infection) ?- Urinalysis on admission concerning for UTI.   ?-Urine cultures ordered and pending at time of discharge.   ?-Patient received 3 to 4 days of IV Rocephin and was transitioned to oral Augmentin.   ?-Outpatient follow-up.   ? ?Leukocytosis ?- Likely secondary to probable clinical pneumonia and UTI. ?-Blood cultures negative x5 days. ?-Urine cultures pending. ?-Leukocytosis trended down. ?-Was on IV Rocephin and IV azithromycin and subsequently transition to Augmentin and patient be discharged on 3 more days of Augmentin to complete a 7-day course of antibiotic treatment.   ? ?Paroxysmal atrial fibrillation ([place]) ?#) Paroxysmal atrial fibrillation: Documented history of such. In setting of CHA2DS2-VASc score of  3, there is an indication for chronic anticoagulation for  thromboembolic prophylaxis.  ?-Patient noted to still be on Eliquis per cardiology note from  10/23/2021.  ?-Home AV nodal blocking regimen: Coreg.  Most recent echocardiogram occurred in December 2016, was notable for LVEF 60 to 65%, mild LVH, normal left ventricular cavity size, and no evidence of focal wall motion abnormalities.  ?-Patient was maintained on home regimen Coreg. ?-Patient maintained on home regimen Eliquis for anticoagulation. ?-Per RN early on in the hospitalization, patient with black stools however patient noted to be on oral iron supplementation prior to admission. ?-Patient does endorse black stools at home which he attributes to his oral iron supplementation. ?-Hemoglobin remained stable throughout the hospitalization.  ?-Patient monitored on Eliquis.  ?-Outpatient follow-up with PCP.  ?  ? ?Hypercholesterolemia ?-Patient maintained on home regimen statin. ?  ?  ?  ? ?CAP (community acquired pneumonia) ?#) Community-acquired pneumonia, clinical suspicion for such in the setting of 3 to 4 days of new onset cough coinciding with development of confusion, suspected to represent acute metabolic encephalopathy, with presenting labs reflecting leukocytosis of neutrophilic predominance and presenting chest x-ray showing evidence of bibasilar airspace opacities, potentially representing infiltrate versus atelectasis.  ?- Procalcitonin 0.17.   ?-COVID-19/influenza PCR negative.  ?-Blood cultures with no growth to date. ?-Patient was placed on IV Rocephin, IV azithromycin and as patient improved clinically was transitioned to oral Augmentin to complete a 7-day course of antibiotic treatment.  Patient also maintained on Mucinex and Claritin.   ?-Outpatient follow-up.   ?  ?  ?  ? ?  ?  ?  ? ? ? ? ?  ? ?  Procedures: ?Chest x-ray 12/01/2021 ? ?Consultations: ?None ? ?Discharge Exam: ?Vitals:  ? 12/06/21 0325 12/06/21 0800  ?BP: (!) 160/58 (!) 164/71  ?Pulse: 65 (!) 59  ?Resp: 19 20  ?Temp: 98.2 ?F (36.8 ?C)  98 ?F (36.7 ?C)  ?SpO2: 98% 99%  ? ? ?General: NAD. ?Cardiovascular: Regular rate rhythm no murmurs rubs or gallops.  No JVD.  No lower extremity edema. ?Respiratory: Clear to auscultation bilaterally.  No wheezes, no crac

## 2021-12-07 LAB — CULTURE, BLOOD (ROUTINE X 2)
Culture: NO GROWTH
Special Requests: ADEQUATE

## 2021-12-08 DIAGNOSIS — Z79899 Other long term (current) drug therapy: Secondary | ICD-10-CM | POA: Diagnosis not present

## 2021-12-09 NOTE — Progress Notes (Signed)
Remote pacemaker transmission.   

## 2021-12-10 DIAGNOSIS — Z7901 Long term (current) use of anticoagulants: Secondary | ICD-10-CM | POA: Diagnosis not present

## 2021-12-10 DIAGNOSIS — E46 Unspecified protein-calorie malnutrition: Secondary | ICD-10-CM | POA: Diagnosis not present

## 2021-12-10 DIAGNOSIS — R2681 Unsteadiness on feet: Secondary | ICD-10-CM | POA: Diagnosis not present

## 2021-12-18 ENCOUNTER — Other Ambulatory Visit: Payer: Self-pay | Admitting: *Deleted

## 2021-12-18 ENCOUNTER — Telehealth: Payer: Self-pay

## 2021-12-18 DIAGNOSIS — Z79899 Other long term (current) drug therapy: Secondary | ICD-10-CM | POA: Diagnosis not present

## 2021-12-18 MED ORDER — CARVEDILOL 12.5 MG PO TABS: 18.7500 mg | ORAL_TABLET | Freq: Two times a day (BID) | ORAL | 3 refills | Status: DC

## 2021-12-18 NOTE — Patient Outreach (Signed)
Per [place] eligible member currently resides in [facility].  Screening for potential care coordination/care management services as a benefit of member's insurance plan. ? ?Member's PCP at [facility] at [place] has [facility] care management services available.  ? ?Mr. Adcock admitted to SNF on 12/06/21 after hospitalization. ? ?Update received from [place] [place] indicating Mr. Chieffo will remain LTC. ? ?Will sign off. No identifiable post SNF care coordination needs at this time.  ? ? ? ? ?Marthenia Rolling, MSN, RN,BSN ?[place] Coordinator ?[phone] [facility]) ?[phone]  (Toll free office)   ?

## 2021-12-18 NOTE — Telephone Encounter (Signed)
-----   Message from Will Meredith Leeds, MD sent at 12/02/2021 10:53 AM EDT ----- ?Normal remote reviewed. Battery and lead parameters stable. NSVT noted. Increase coreg to 18.75 mg BID. ?

## 2021-12-18 NOTE — Telephone Encounter (Signed)
The patient's friend, [name] (on [place]), has been notified of the result and verbalized understanding.  All questions (if any) were answered. ?Antonieta Iba, RN 12/18/2021 1:24 PM  ?Rx has been sent in. He also request that I notify [facility] nursing home where the patient resides because they handle all of his medication changes. Will contact them and send over new orders.  ?

## 2022-01-08 DIAGNOSIS — L03113 Cellulitis of right upper limb: Secondary | ICD-10-CM | POA: Diagnosis not present

## 2022-01-12 DIAGNOSIS — R278 Other lack of coordination: Secondary | ICD-10-CM | POA: Diagnosis not present

## 2022-01-12 DIAGNOSIS — R293 Abnormal posture: Secondary | ICD-10-CM | POA: Diagnosis not present

## 2022-01-12 DIAGNOSIS — G9341 Metabolic encephalopathy: Secondary | ICD-10-CM | POA: Diagnosis not present

## 2022-01-13 DIAGNOSIS — G9341 Metabolic encephalopathy: Secondary | ICD-10-CM | POA: Diagnosis not present

## 2022-01-13 DIAGNOSIS — R278 Other lack of coordination: Secondary | ICD-10-CM | POA: Diagnosis not present

## 2022-01-13 DIAGNOSIS — R293 Abnormal posture: Secondary | ICD-10-CM | POA: Diagnosis not present

## 2022-01-14 DIAGNOSIS — L03113 Cellulitis of right upper limb: Secondary | ICD-10-CM | POA: Diagnosis not present

## 2022-01-14 DIAGNOSIS — G9341 Metabolic encephalopathy: Secondary | ICD-10-CM | POA: Diagnosis not present

## 2022-01-14 DIAGNOSIS — R278 Other lack of coordination: Secondary | ICD-10-CM | POA: Diagnosis not present

## 2022-01-14 DIAGNOSIS — R293 Abnormal posture: Secondary | ICD-10-CM | POA: Diagnosis not present

## 2022-01-15 DIAGNOSIS — R293 Abnormal posture: Secondary | ICD-10-CM | POA: Diagnosis not present

## 2022-01-15 DIAGNOSIS — G9341 Metabolic encephalopathy: Secondary | ICD-10-CM | POA: Diagnosis not present

## 2022-01-15 DIAGNOSIS — R278 Other lack of coordination: Secondary | ICD-10-CM | POA: Diagnosis not present

## 2022-01-16 DIAGNOSIS — R278 Other lack of coordination: Secondary | ICD-10-CM | POA: Diagnosis not present

## 2022-01-16 DIAGNOSIS — G9341 Metabolic encephalopathy: Secondary | ICD-10-CM | POA: Diagnosis not present

## 2022-01-16 DIAGNOSIS — R293 Abnormal posture: Secondary | ICD-10-CM | POA: Diagnosis not present

## 2022-01-21 DIAGNOSIS — L03113 Cellulitis of right upper limb: Secondary | ICD-10-CM | POA: Diagnosis not present

## 2022-02-25 ENCOUNTER — Ambulatory Visit (INDEPENDENT_AMBULATORY_CARE_PROVIDER_SITE_OTHER): Payer: Medicare Other

## 2022-02-25 DIAGNOSIS — I495 Sick sinus syndrome: Secondary | ICD-10-CM | POA: Diagnosis not present

## 2022-02-26 LAB — CUP PACEART REMOTE DEVICE CHECK
Battery Remaining Longevity: 33 mo
Battery Voltage: 2.96 V
Brady Statistic AP VP Percent: 85.92 %
Brady Statistic AP VS Percent: 0.01 %
Brady Statistic AS VP Percent: 14.07 %
Brady Statistic AS VS Percent: 0.01 %
Brady Statistic RA Percent Paced: 83.43 %
Brady Statistic RV Percent Paced: 99.93 %
Date Time Interrogation Session: 20230621104830
Implantable Lead Implant Date: 20161216
Implantable Lead Implant Date: 20161216
Implantable Lead Location: 753859
Implantable Lead Location: 753860
Implantable Lead Model: 5076
Implantable Lead Model: 5076
Implantable Pulse Generator Implant Date: 20161216
Lead Channel Impedance Value: 342 Ohm
Lead Channel Impedance Value: 361 Ohm
Lead Channel Impedance Value: 418 Ohm
Lead Channel Impedance Value: 456 Ohm
Lead Channel Pacing Threshold Amplitude: 0.5 V
Lead Channel Pacing Threshold Amplitude: 0.625 V
Lead Channel Pacing Threshold Pulse Width: 0.4 ms
Lead Channel Pacing Threshold Pulse Width: 0.4 ms
Lead Channel Sensing Intrinsic Amplitude: 3.5 mV
Lead Channel Sensing Intrinsic Amplitude: 3.5 mV
Lead Channel Sensing Intrinsic Amplitude: 4.125 mV
Lead Channel Sensing Intrinsic Amplitude: 4.125 mV
Lead Channel Setting Pacing Amplitude: 2 V
Lead Channel Setting Pacing Amplitude: 2.5 V
Lead Channel Setting Pacing Pulse Width: 0.4 ms
Lead Channel Setting Sensing Sensitivity: 2 mV

## 2022-03-09 NOTE — Progress Notes (Signed)
Remote pacemaker transmission.   

## 2022-05-27 ENCOUNTER — Ambulatory Visit (INDEPENDENT_AMBULATORY_CARE_PROVIDER_SITE_OTHER): Payer: Medicare Other

## 2022-05-27 DIAGNOSIS — I495 Sick sinus syndrome: Secondary | ICD-10-CM | POA: Diagnosis not present

## 2022-05-29 LAB — CUP PACEART REMOTE DEVICE CHECK
Battery Remaining Longevity: 31 mo
Battery Voltage: 2.95 V
Brady Statistic AP VP Percent: 90.11 %
Brady Statistic AP VS Percent: 0 %
Brady Statistic AS VP Percent: 9.88 %
Brady Statistic AS VS Percent: 0.01 %
Brady Statistic RA Percent Paced: 89.76 %
Brady Statistic RV Percent Paced: 99.92 %
Date Time Interrogation Session: 20230921145533
Implantable Lead Implant Date: 20161216
Implantable Lead Implant Date: 20161216
Implantable Lead Location: 753859
Implantable Lead Location: 753860
Implantable Lead Model: 5076
Implantable Lead Model: 5076
Implantable Pulse Generator Implant Date: 20161216
Lead Channel Impedance Value: 323 Ohm
Lead Channel Impedance Value: 342 Ohm
Lead Channel Impedance Value: 399 Ohm
Lead Channel Impedance Value: 456 Ohm
Lead Channel Pacing Threshold Amplitude: 0.5 V
Lead Channel Pacing Threshold Amplitude: 0.625 V
Lead Channel Pacing Threshold Pulse Width: 0.4 ms
Lead Channel Pacing Threshold Pulse Width: 0.4 ms
Lead Channel Sensing Intrinsic Amplitude: 2.375 mV
Lead Channel Sensing Intrinsic Amplitude: 2.375 mV
Lead Channel Sensing Intrinsic Amplitude: 4.125 mV
Lead Channel Sensing Intrinsic Amplitude: 4.125 mV
Lead Channel Setting Pacing Amplitude: 2 V
Lead Channel Setting Pacing Amplitude: 2.5 V
Lead Channel Setting Pacing Pulse Width: 0.4 ms
Lead Channel Setting Sensing Sensitivity: 2 mV

## 2022-06-09 NOTE — Progress Notes (Signed)
Remote pacemaker transmission.   

## 2022-07-08 DEATH — deceased
